# Patient Record
Sex: Female | Born: 1935 | Race: White | Hispanic: No | Marital: Married | Smoking: Never smoker
Health system: Southern US, Community
[De-identification: ages and names within clinical notes are randomized; demographics above are authoritative.]

## PROBLEM LIST (undated history)

## (undated) DIAGNOSIS — M76899 Other specified enthesopathies of unspecified lower limb, excluding foot: Secondary | ICD-10-CM

## (undated) DIAGNOSIS — R42 Dizziness and giddiness: Secondary | ICD-10-CM

## (undated) DIAGNOSIS — T7840XA Allergy, unspecified, initial encounter: Secondary | ICD-10-CM

## (undated) DIAGNOSIS — I10 Essential (primary) hypertension: Secondary | ICD-10-CM

## (undated) DIAGNOSIS — Z9289 Personal history of other medical treatment: Secondary | ICD-10-CM

## (undated) DIAGNOSIS — U071 COVID-19: Secondary | ICD-10-CM

## (undated) DIAGNOSIS — C44611 Basal cell carcinoma of skin of unspecified upper limb, including shoulder: Secondary | ICD-10-CM

## (undated) DIAGNOSIS — D509 Iron deficiency anemia, unspecified: Secondary | ICD-10-CM

## (undated) DIAGNOSIS — I7300 Raynaud's syndrome without gangrene: Secondary | ICD-10-CM

## (undated) DIAGNOSIS — M159 Polyosteoarthritis, unspecified: Secondary | ICD-10-CM

## (undated) HISTORY — DX: Essential (primary) hypertension: I10

## (undated) HISTORY — PX: APPENDECTOMY: SHX54

## (undated) HISTORY — DX: Other specified enthesopathies of unspecified lower limb, excluding foot: M76.899

## (undated) HISTORY — PX: TONSILLECTOMY: SHX5217

## (undated) HISTORY — DX: Personal history of other medical treatment: Z92.89

## (undated) HISTORY — DX: Allergy, unspecified, initial encounter: T78.40XA

## (undated) HISTORY — DX: Polyosteoarthritis, unspecified: M15.9

## (undated) HISTORY — DX: Iron deficiency anemia, unspecified: D50.9

## (undated) HISTORY — DX: Raynaud's syndrome without gangrene: I73.00

---

## 1962-09-15 DIAGNOSIS — Z9289 Personal history of other medical treatment: Secondary | ICD-10-CM

## 1962-09-15 HISTORY — DX: Personal history of other medical treatment: Z92.89

## 1998-09-19 ENCOUNTER — Encounter: Payer: Self-pay | Admitting: Obstetrics and Gynecology

## 1998-09-19 ENCOUNTER — Ambulatory Visit (HOSPITAL_COMMUNITY): Admission: RE | Admit: 1998-09-19 | Discharge: 1998-09-19 | Payer: Self-pay | Admitting: Obstetrics and Gynecology

## 1999-11-27 ENCOUNTER — Encounter: Payer: Self-pay | Admitting: Family Medicine

## 1999-11-27 ENCOUNTER — Encounter: Admission: RE | Admit: 1999-11-27 | Discharge: 1999-11-27 | Payer: Self-pay | Admitting: Family Medicine

## 2003-02-02 ENCOUNTER — Encounter: Admission: RE | Admit: 2003-02-02 | Discharge: 2003-02-02 | Payer: Self-pay | Admitting: Family Medicine

## 2003-02-02 ENCOUNTER — Encounter: Payer: Self-pay | Admitting: Family Medicine

## 2006-09-15 LAB — HM MAMMOGRAPHY: HM Mammogram: NORMAL

## 2009-08-02 ENCOUNTER — Ambulatory Visit: Payer: Self-pay | Admitting: Family Medicine

## 2009-08-02 DIAGNOSIS — D509 Iron deficiency anemia, unspecified: Secondary | ICD-10-CM | POA: Insufficient documentation

## 2009-08-02 DIAGNOSIS — M76899 Other specified enthesopathies of unspecified lower limb, excluding foot: Secondary | ICD-10-CM | POA: Insufficient documentation

## 2009-08-02 DIAGNOSIS — I10 Essential (primary) hypertension: Secondary | ICD-10-CM

## 2009-08-02 HISTORY — DX: Other specified enthesopathies of unspecified lower limb, excluding foot: M76.899

## 2009-08-02 HISTORY — DX: Iron deficiency anemia, unspecified: D50.9

## 2009-08-02 HISTORY — DX: Essential (primary) hypertension: I10

## 2010-07-29 ENCOUNTER — Telehealth: Payer: Self-pay | Admitting: Family Medicine

## 2010-08-12 ENCOUNTER — Ambulatory Visit: Payer: Self-pay | Admitting: Family Medicine

## 2010-08-12 DIAGNOSIS — M159 Polyosteoarthritis, unspecified: Secondary | ICD-10-CM

## 2010-08-12 DIAGNOSIS — I7300 Raynaud's syndrome without gangrene: Secondary | ICD-10-CM | POA: Insufficient documentation

## 2010-08-12 DIAGNOSIS — R21 Rash and other nonspecific skin eruption: Secondary | ICD-10-CM | POA: Insufficient documentation

## 2010-08-12 HISTORY — DX: Polyosteoarthritis, unspecified: M15.9

## 2010-08-12 HISTORY — DX: Raynaud's syndrome without gangrene: I73.00

## 2010-10-15 NOTE — Progress Notes (Signed)
Summary: Pt sch ov for 08/12/10. Req refill of HCTZ to last until ov  Phone Note Refill Request Message from:  Patient on July 29, 2010 11:40 AM  Refills Requested: Medication #1:  HYDROCHLOROTHIAZIDE 25 MG TABS one half by mouth once daily Pls call in partial refill to [facility] in [place], to last until ov on 08/12/10   Method Requested: Telephone to Pharmacy Next Appointment Scheduled: 08-12-10 Initial call taken by: Lucy Antigua,  July 29, 2010 11:41 AM  Follow-up for Phone Call        I filled the #45 with 3 refills as pharmacy had requested. pt taking 1/2 daily.  Informed pt on VM we will see her  on 11/28 Follow-up by: Sid Falcon LPN,  July 29, 2010 12:36 PM

## 2010-10-15 NOTE — Assessment & Plan Note (Signed)
Summary: med check/refills/cjr   Vital Signs:  Patient profile:   77 year old female Menstrual status:  postmenopausal Weight:      140 pounds Temp:     98.4 degrees F oral BP sitting:   140 / 80  (left arm) Cuff size:   regular  Vitals Entered By: Sid Falcon LPN (August 12, 2010 10:53 AM)  History of Present Illness: Patient here to discuss several items as follows.  Hypertension treated with 3 drug regimen. Compliant with all medications. No side effects.  Osteoarthritis mostly involving hips and knees. Has used low-dose tramadol in past from orthopedist which seems to work. Mostly has night pain. Requesting refill of tramadol. Only takes about one at night. Pain is moderate severity.  Still able to walk for exercise.  New issue of some recent whitish discoloring of left hand which is usually very transient. Promptly returns in color with some tingling sensation with warmth. No prior history of Raynaud's. No significant associated pain.  Triggered by cold.    Bilateral forearm rash. Erythematous and slightly scaly. Has not used any creams. No exacerbating features.  Three-week history of upper respiratory and symptoms of nasal congestion, occasional productive cough and some facial pain. No headaches or fever. Also has frequent matting of both eyes.  Hypertension History:      She denies headache, chest pain, palpitations, dyspnea with exertion, orthopnea, PND, peripheral edema, visual symptoms, neurologic problems, syncope, and side effects from treatment.  She notes no problems with any antihypertensive medication side effects.        Positive major cardiovascular risk factors include female age 53 years old or older and hypertension.  Negative major cardiovascular risk factors include non-tobacco-user status.     Allergies: 1)  Lidocaine-Epinephrine (Lidocaine-Epinephrine) 2)  Penicillin V Potassium (Penicillin V Potassium) 3)  Vioxx  Past History:  Past Medical  History: Last updated: 08/02/2009 Anemia-iron deficiency Hypertension Blood Transfusion 1964 Hay fever/Allergies  Past Surgical History: Last updated: 08/02/2009 Appendectomy  1954 Tonsillectomy  1955 A/P repair  1964  Family History: Last updated: 08/02/2009 Family History of Arthritis Family History Hypertension Family History of Prostate CA  Family history Diabetes  Social History: Last updated: 08/02/2009 Retired  Designer, jewellery Married Never Smoked Alcohol use-no Regular exercise-yes  Risk Factors: Exercise: yes (08/02/2009)  Risk Factors: Smoking Status: never (08/02/2009) PMH-FH-SH reviewed for relevance  Review of Systems  The patient denies anorexia, fever, weight loss, hoarseness, chest pain, syncope, dyspnea on exertion, peripheral edema, prolonged cough, headaches, hemoptysis, abdominal pain, melena, hematochezia, and severe indigestion/heartburn.    Physical Exam  General:  Well-developed,well-nourished,in no acute distress; alert,appropriate and cooperative throughout examination Head:  Normocephalic and atraumatic without obvious abnormalities. No apparent alopecia or balding. Eyes:  patient has mild erythema both conjunctiva. Eye exam was normal Ears:  External ear exam shows no significant lesions or deformities.  Otoscopic examination reveals clear canals, tympanic membranes are intact bilaterally without bulging, retraction, inflammation or discharge. Hearing is grossly normal bilaterally. Mouth:  Oral mucosa and oropharynx without lesions or exudates.  Teeth in good repair. Neck:  No deformities, masses, or tenderness noted. Lungs:  Normal respiratory effort, chest expands symmetrically. Lungs are clear to auscultation, no crackles or wheezes. Heart:  normal rate, regular rhythm, and no gallop.   Extremities:  no edema Both hands warm to touch with good cap refill.  Good distal radial and ulnar pulses bilateral. Neurologic:  alert & oriented X3  and cranial nerves II-XII intact.   Skin:  right and left forearm reveals erythematous macular rash which is slightly scaly nonraised with no nodularity. No pigment change. Cervical Nodes:  No lymphadenopathy noted Psych:  normally interactive, good eye contact, not anxious appearing, and not depressed appearing.     Impression & Recommendations:  Problem # 1:  HYPERTENSION (ICD-401.9) meds refilled. Her updated medication list for this problem includes:    Hydrochlorothiazide 25 Mg Tabs (Hydrochlorothiazide) ..... One half by mouth once daily    Amlodipine Besylate 5 Mg Tabs (Amlodipine besylate) ..... One tab daily    Benazepril Hcl 10 Mg Tabs (Benazepril hcl) ..... One tab daily  Problem # 2:  SINUSITIS, ACUTE (ICD-461.9) Assessment: New  Her updated medication list for this problem includes:    Azithromycin 250 Mg Tabs (Azithromycin) .Marland Kitchen... 2 by mouth today then one by mouth once daily for 4 days  Problem # 3:  OSTEOARTHRITIS, GENERALIZED (ICD-715.00) refill tramadol which she uses sparingly. Her updated medication list for this problem includes:    Aspirin 81 Mg Tabs (Aspirin) ..... Once daily    Tramadol Hcl 50 Mg Tabs (Tramadol hcl) ..... One to two tabs as needed  at bedtime    Meloxicam 7.5 Mg Tabs (Meloxicam) ..... One by mouth once daily  Problem # 4:  SKIN RASH (ICD-782.1) ?eczematous.  Trial of steroid cream rec and bx vs derm referral if no better in few weeks.  Problem # 5:  RAYNAUD'S SYNDROME (ICD-443.0) Assessment: New Transient Raynaud's symptoms without other worrisome symptoms.  Reassurance for now.  Complete Medication List: 1)  Vitamin D (ergocalciferol) 50000 Unit Caps (Ergocalciferol) .... Once daily 2)  Caltrate 600 1500 Mg Tabs (Calcium carbonate) .... Once daily 3)  Aspirin 81 Mg Tabs (Aspirin) .... Once daily 4)  Tramadol Hcl 50 Mg Tabs (Tramadol hcl) .... One to two tabs as needed  at bedtime 5)  Hydrochlorothiazide 25 Mg Tabs (Hydrochlorothiazide)  .... One half by mouth once daily 6)  Meloxicam 7.5 Mg Tabs (Meloxicam) .... One by mouth once daily 7)  Amlodipine Besylate 5 Mg Tabs (Amlodipine besylate) .... One tab daily 8)  Benazepril Hcl 10 Mg Tabs (Benazepril hcl) .... One tab daily 9)  Azithromycin 250 Mg Tabs (Azithromycin) .... 2 by mouth today then one by mouth once daily for 4 days  Hypertension Assessment/Plan:      The patient's hypertensive risk group is category B: At least one risk factor (excluding diabetes) with no target organ damage.  Today's blood pressure is 140/80.    Patient Instructions: 1)  Schedule BMP  401.9 2)  Please schedule a follow-up appointment as needed .  3)  It is important that you exercise reguarly at least 20 minutes 5 times a week. If you develop chest pain, have severe difficulty breathing, or feel very tired, stop exercising immediately and seek medical attention.  Prescriptions: BENAZEPRIL HCL 10 MG TABS (BENAZEPRIL HCL) one tab daily  #90 x 3   Entered and Authorized by:   Evelena Peat MD   Signed by:   Evelena Peat MD on 08/12/2010   Method used:   Electronically to        [facility] [phone]* (retail)       [address] [place], [place]  [address]       Ph: [phone] or [phone]       Fax: [phone]   RxID:   [id number] AMLODIPINE BESYLATE 5  MG TABS (AMLODIPINE BESYLATE) one tab daily  #90 x 3   Entered and Authorized by:   Evelena Peat MD   Signed by:   Evelena Peat MD on 08/12/2010   Method used:   Electronically to        [facility] [phone]* (retail)       [address] [place], [place]  [address]       Ph: [phone] or [phone]       Fax: [phone]   RxID:   [id number] HYDROCHLOROTHIAZIDE 25 MG TABS (HYDROCHLOROTHIAZIDE) one half by mouth once daily  #45 Not Speci x 3   Entered and Authorized by:   Evelena Peat MD   Signed by:   Evelena Peat MD on 08/12/2010    Method used:   Electronically to        [facility] [phone]* (retail)       [address] [place], [place]  [address]       Ph: [phone] or [phone]       Fax: [phone]   RxID:   [id number] TRAMADOL HCL 50 MG TABS (TRAMADOL HCL) one tab at bedtime  #60 x 11   Entered and Authorized by:   Evelena Peat MD   Signed by:   Evelena Peat MD on 08/12/2010   Method used:   Electronically to        [facility] [phone]* (retail)       [address] [place], [place]  [address]       Ph: [phone] or [phone]       Fax: [phone]   RxID:   [id number] AZITHROMYCIN 250 MG TABS (AZITHROMYCIN) 2 by mouth today then one by mouth once daily for 4 days  #6 x 0   Entered and Authorized by:   Evelena Peat MD   Signed by:   Evelena Peat MD on 08/12/2010   Method used:   Electronically to        [facility] [phone]* (retail)       [address] [place], [place]  [address]       Ph: [phone] or [phone]       Fax: [phone]   RxID:   [id number]    Orders Added: 1)  Est. Patient Level IV [998[address]]

## 2011-01-01 ENCOUNTER — Encounter: Payer: Self-pay | Admitting: Family Medicine

## 2011-01-02 ENCOUNTER — Encounter: Payer: Self-pay | Admitting: Family Medicine

## 2011-01-02 ENCOUNTER — Ambulatory Visit (INDEPENDENT_AMBULATORY_CARE_PROVIDER_SITE_OTHER): Payer: Medicare PPO | Admitting: Family Medicine

## 2011-01-02 VITALS — BP 150/90 | Temp 98.3°F | Ht 63.25 in | Wt 139.0 lb

## 2011-01-02 DIAGNOSIS — L989 Disorder of the skin and subcutaneous tissue, unspecified: Secondary | ICD-10-CM

## 2011-01-02 NOTE — Patient Instructions (Signed)
Keep wound dry for the first 24 hours then clean daily with soap and water for one week. Apply topical antibiotic daily for 3-4 days. Keep covered with clean dressing for 4-5 days. Follow up promptly for any signs of infection such as redness, warmth, pain, or drainage.  

## 2011-01-02 NOTE — Progress Notes (Addendum)
  Subjective:    Patient ID: ZILDA NO, female    DOB: [birth year], 77 y.o.   MRN: [id number]  HPI Skin lesion right arm. Present for one possibly 2 years. Occasionally itches. Mostly macular erythematous. We noticed recently on visit. She does not have any personal history of skin cancer but does have high risk with skin freckling and fair skin.   Review of Systems     Objective:   Physical Exam  Constitutional: She appears well-developed and well-nourished. No distress.  Cardiovascular: Normal rate and regular rhythm.   Pulmonary/Chest: Breath sounds normal. No respiratory distress. She has no wheezes. She has no rales.  Skin:       Patient has macular erythematous rash which is nonblanching and slightly scaly right forearm dorsally. This is approximately 1 x 1 cm in dimension. No nodular changes. No pustules. No ulceration.          Assessment & Plan:  Skin lesion right forearm. Rule out Bowen's disease versus other. Recommended punch biopsy and patient consented after hearing risks and benefits. Anesthesia 1% Xylocaine with epinephrine. Skin prepped with Betadine. Punch biopsy with 4 mm punch. Minimal bleeding. Closed with one suture of 5 Ethilon. Antibiotic and dressing applied  Path superficial basal cell cancer.  Pt has been notified and will set up to see derm.

## 2011-01-06 ENCOUNTER — Telehealth: Payer: Self-pay | Admitting: *Deleted

## 2011-01-06 NOTE — Progress Notes (Signed)
Addended by: Trinna Post on: 01/06/2011 07:54 AM   Modules accepted: Orders

## 2011-01-06 NOTE — Telephone Encounter (Signed)
Yes, add back.

## 2011-01-06 NOTE — Telephone Encounter (Signed)
VM from pt to report last at last week OV she told us she was not taking Norvasc/Amlodipine 5 mg.  Wanted Dr Caryl Never to be made aware she IS taking it. This was removed from her med list. OK to add again?

## 2011-01-07 NOTE — Telephone Encounter (Signed)
Med added to list . 

## 2011-04-04 ENCOUNTER — Encounter: Payer: Self-pay | Admitting: Family Medicine

## 2011-07-08 ENCOUNTER — Encounter: Payer: Self-pay | Admitting: Family Medicine

## 2011-08-18 ENCOUNTER — Encounter: Payer: Self-pay | Admitting: Family Medicine

## 2011-08-18 ENCOUNTER — Ambulatory Visit (INDEPENDENT_AMBULATORY_CARE_PROVIDER_SITE_OTHER): Payer: Medicare PPO | Admitting: Family Medicine

## 2011-08-18 VITALS — BP 132/72 | Temp 98.6°F | Wt 136.0 lb

## 2011-08-18 DIAGNOSIS — Z23 Encounter for immunization: Secondary | ICD-10-CM

## 2011-08-18 DIAGNOSIS — I10 Essential (primary) hypertension: Secondary | ICD-10-CM

## 2011-08-18 DIAGNOSIS — M159 Polyosteoarthritis, unspecified: Secondary | ICD-10-CM

## 2011-08-18 LAB — BASIC METABOLIC PANEL
Calcium: 9.4 mg/dL (ref 8.4–10.5)
Chloride: 102 mEq/L (ref 96–112)
Creatinine, Ser: 0.7 mg/dL (ref 0.4–1.2)

## 2011-08-18 MED ORDER — BENAZEPRIL HCL 10 MG PO TABS: 10.0000 mg | ORAL_TABLET | Freq: Every day | ORAL | Status: DC

## 2011-08-18 MED ORDER — AMLODIPINE BESYLATE 5 MG PO TABS: 5.0000 mg | ORAL_TABLET | Freq: Every day | ORAL | Status: DC

## 2011-08-18 MED ORDER — TRAMADOL HCL 50 MG PO TABS: 50.0000 mg | ORAL_TABLET | Freq: Four times a day (QID) | ORAL | Status: DC | PRN

## 2011-08-18 MED ORDER — HYDROCHLOROTHIAZIDE 25 MG PO TABS: 12.5000 mg | ORAL_TABLET | Freq: Every day | ORAL | Status: DC

## 2011-08-18 NOTE — Progress Notes (Signed)
  Subjective:    Patient ID: Molly Patterson, female    DOB: 01/31/1932, 77 y.o.   MRN: [id number]  HPI  Medical followup. Hypertension treated with amlodipine, benazepril, and hydrochlorothiazide. Compliant with treatment. Blood pressure stable by home readings. No recent orthostasis. Patient denies any chest pains or dizziness.  History of osteoarthritis which is generalized. Uses low-dose tramadol mostly at night which is working very well for her. No progressive symptoms. No recent falls. Needs refills of all medications.  PMH,SH, FH reviewed:  Past Medical History  Diagnosis Date  . ANEMIA-IRON DEFICIENCY 08/02/2009  . BURSITIS, HIP 08/02/2009  . HYPERTENSION 08/02/2009  . OSTEOARTHRITIS, GENERALIZED 08/12/2010  . Raynaud's syndrome 08/12/2010  . Allergy     hayfever  . Transfusion history 1964   Past Surgical History  Procedure Date  . Appendectomy   . Tonsillectomy     reports that she has never smoked. She does not have any smokeless tobacco history on file. She reports that she does not drink alcohol. Her drug history not on file. family history is negative for Arthritis, and Hypertension, and Diabetes, and Prostate cancer, . Allergies  Allergen Reactions  . Lidocaine-Epinephrine     REACTION: pulse rapic  . Penicillins     REACTION: hives  . Rofecoxib     Vioxx, itiching      Review of Systems  Constitutional: Negative for fever, activity change, appetite change, fatigue and unexpected weight change.  HENT: Negative for hearing loss, ear pain, sore throat and trouble swallowing.   Eyes: Negative for visual disturbance.  Respiratory: Negative for cough and shortness of breath.   Cardiovascular: Negative for chest pain and palpitations.  Gastrointestinal: Negative for abdominal pain, diarrhea, constipation and blood in stool.  Genitourinary: Negative for dysuria and hematuria.  Musculoskeletal: Negative for myalgias, back pain and arthralgias.  Skin:  Negative for rash.  Neurological: Negative for dizziness, syncope and headaches.  Hematological: Negative for adenopathy.  Psychiatric/Behavioral: Negative for confusion and dysphoric mood.       Objective:   Physical Exam  Constitutional: She is oriented to person, place, and time. She appears well-developed and well-nourished.  Neck: Neck supple. No thyromegaly present.  Cardiovascular: Normal rate, regular rhythm and normal heart sounds.   Pulmonary/Chest: Effort normal and breath sounds normal. No respiratory distress. She has no wheezes. She has no rales.  Musculoskeletal: She exhibits no edema.  Lymphadenopathy:    She has no cervical adenopathy.  Neurological: She is alert and oriented to person, place, and time.          Assessment & Plan:  #1 hypertension stable.  refill all medications for one year. Check basic metabolic panel #2 osteoarthritis, generalized. Stable. Refill tramadol for one year

## 2011-08-19 NOTE — Progress Notes (Signed)
Quick Note:  Pt informed ______ 

## 2011-12-22 ENCOUNTER — Encounter: Payer: Self-pay | Admitting: Family Medicine

## 2011-12-22 ENCOUNTER — Ambulatory Visit (INDEPENDENT_AMBULATORY_CARE_PROVIDER_SITE_OTHER): Payer: Medicare Other | Admitting: Family Medicine

## 2011-12-22 VITALS — BP 120/60 | Temp 98.2°F | Wt 138.0 lb

## 2011-12-22 DIAGNOSIS — N6489 Other specified disorders of breast: Secondary | ICD-10-CM

## 2011-12-22 NOTE — Progress Notes (Signed)
  Subjective:    Patient ID: Molly Patterson, female    DOB: [birth year], 78 y.o.   MRN: [id number]  HPI  Patient seen for recent right chest wall injury. She was going down some steps and came out of her shoe and fell against the rail banister. She had extensive bruising of the right breast and now has very small less than 1 cm tender nodule right breast upper medial aspect around the 2:00 position. This is actually decreasing in size compared to couple weeks ago. She's had no nipple discharge. No adenopathy.   Review of Systems  Constitutional: Negative for appetite change and unexpected weight change.  Respiratory: Negative for shortness of breath.   Cardiovascular: Negative for chest pain.       Objective:   Physical Exam  Constitutional: She appears well-developed and well-nourished.  Cardiovascular: Normal rate and regular rhythm.   Pulmonary/Chest: Effort normal and breath sounds normal. No respiratory distress. She has no wheezes. She has no rales.       Breasts are symmetric. No visible ecchymosis. She has small less than 1 cm movable and tender nodule right upper medial breast around 2:00 position.          Assessment & Plan:  Probable small hematoma right breast given her recent history (fall with bruising) and the fact this is slightly tender. Diagnostic mammogram and ultrasound if indicated will be ordered. Suspect this will resolve over time

## 2012-01-23 ENCOUNTER — Encounter: Payer: Self-pay | Admitting: Family Medicine

## 2012-04-30 ENCOUNTER — Ambulatory Visit (INDEPENDENT_AMBULATORY_CARE_PROVIDER_SITE_OTHER): Payer: Medicare Other | Admitting: Family Medicine

## 2012-04-30 ENCOUNTER — Encounter: Payer: Self-pay | Admitting: Family Medicine

## 2012-04-30 VITALS — BP 120/80 | Temp 98.1°F | Wt 135.0 lb

## 2012-04-30 DIAGNOSIS — M76899 Other specified enthesopathies of unspecified lower limb, excluding foot: Secondary | ICD-10-CM

## 2012-04-30 DIAGNOSIS — M7062 Trochanteric bursitis, left hip: Secondary | ICD-10-CM

## 2012-04-30 MED ORDER — METHYLPREDNISOLONE ACETATE 40 MG/ML IJ SUSP
40.0000 mg | Freq: Once | INTRAMUSCULAR | Status: AC
  Administered 2012-04-30: 40 mg via INTRAMUSCULAR

## 2012-04-30 NOTE — Progress Notes (Signed)
  Subjective:    Patient ID: Molly Patterson, female    DOB: 07-Nov-1931, 78 y.o.   MRN: [id number]  HPI  Bilateral hip pain. First noticed some right lateral hip pain and right lateral thigh pain several weeks ago with flexing at the lumbar spine. This pain was a sharp shooting pain which was very transient and relieved with some extension stretches and very little problem at this time. Greater pain is left hip laterally over bursa. This is relatively continuous. Tramadol with minimal relief. No recent injury. Went to chiropractor and x-rays lumbar spine were reportedly showed degenerative changes. She denies any lower any numbness or weakness. Pain is moderate. No rashes. No urine incontinence.   Review of Systems  Constitutional: Negative for fever, chills, appetite change and unexpected weight change.  Genitourinary: Negative for dysuria.  Skin: Negative for rash.       Objective:   Physical Exam  Constitutional: She appears well-developed and well-nourished.  Cardiovascular: Normal rate and regular rhythm.   Pulmonary/Chest: Effort normal and breath sounds normal. No respiratory distress. She has no wheezes. She has no rales.  Musculoskeletal:       Patient has excellent range of motion both hips. Left hip reveals some tenderness laterally over her greater trochanteric bursa. Straight leg raises are negative bilaterally. No edema  Neurological:       Full-strength lower extremities. 2+ reflexes knee and ankle bilaterally. Normal sensory function.          Assessment & Plan:  Greater trochanter bursitis left hip. Discussed options. Continue icing. Discussed risk and benefits of corticosteroid injection and patient consented. Left hip prepped with Betadine. 40 mg Depo-Medrol and 2 cc plain Xylocaine injected without difficulty.

## 2012-04-30 NOTE — Patient Instructions (Addendum)
Hip Bursitis Bursitis is a swelling and soreness (inflammation) of a fluid-filled sac (bursa). This sac overlies and protects the joints.  CAUSES   Injury.   Overuse of the muscles surrounding the joint.   Arthritis.   Gout.   Infection.   Cold weather.   Inadequate warm-up and conditioning prior to activities.  The cause may not be known.  SYMPTOMS   Mild to severe irritation.   Tenderness and swelling over the outside of the hip.   Pain with motion of the hip.   If the bursa becomes infected, a fever may be present. Redness, tenderness, and warmth will develop over the hip.  Symptoms usually lessen in 3 to 4 weeks with treatment, but can come back. TREATMENT If conservative treatment does not work, your caregiver may advise draining the bursa and injecting cortisone into the area. This may speed up the healing process. This may also be used as an initial treatment of choice. HOME CARE INSTRUCTIONS   Apply ice to the affected area for 15 to 20 minutes every 3 to 4 hours while awake for the first 2 days. Put the ice in a plastic bag and place a towel between the bag of ice and your skin.   Rest the painful joint as much as possible, but continue to put the joint through a normal range of motion at least 4 times per day. When the pain lessens, begin normal, slow movements and usual activities to help prevent stiffness of the hip.   Only take over-the-counter or prescription medicines for pain, discomfort, or fever as directed by your caregiver.   Use crutches to limit weight bearing on the hip joint, if advised.   Elevate your painful hip to reduce swelling. Use pillows for propping and cushioning your legs and hips.   Gentle massage may provide comfort and decrease swelling.  SEEK IMMEDIATE MEDICAL CARE IF:   Your pain increases even during treatment, or you are not improving.   You have a fever.   You have heat and inflammation over the involved bursa.   You have  any other questions or concerns.  MAKE SURE YOU:   Understand these instructions.   Will watch your condition.   Will get help right away if you are not doing well or get worse.  Document Released: 02/21/2002 Document Revised: 08/21/2011 Document Reviewed: 09/20/2008 [employer] Patient Information 2012 [employer]. 

## 2012-07-19 ENCOUNTER — Ambulatory Visit (INDEPENDENT_AMBULATORY_CARE_PROVIDER_SITE_OTHER): Payer: Medicare Other | Admitting: Family Medicine

## 2012-07-19 ENCOUNTER — Telehealth: Payer: Self-pay | Admitting: *Deleted

## 2012-07-19 ENCOUNTER — Encounter: Payer: Self-pay | Admitting: Family Medicine

## 2012-07-19 VITALS — BP 140/72 | Temp 98.2°F | Wt 137.0 lb

## 2012-07-19 DIAGNOSIS — I10 Essential (primary) hypertension: Secondary | ICD-10-CM

## 2012-07-19 DIAGNOSIS — M159 Polyosteoarthritis, unspecified: Secondary | ICD-10-CM

## 2012-07-19 DIAGNOSIS — Z23 Encounter for immunization: Secondary | ICD-10-CM

## 2012-07-19 DIAGNOSIS — M76899 Other specified enthesopathies of unspecified lower limb, excluding foot: Secondary | ICD-10-CM

## 2012-07-19 MED ORDER — TRAMADOL HCL 50 MG PO TABS: ORAL_TABLET | ORAL | Status: DC

## 2012-07-19 NOTE — Telephone Encounter (Signed)
[facility] requesting clarification on Ultram, 1 tab "how often", missing on Rx.  Called pharmacist back, one to two tabs every 6 hours prn pain

## 2012-07-19 NOTE — Progress Notes (Signed)
  Subjective:    Patient ID: Molly Patterson, female    DOB: [birth year], 78 y.o.   MRN: [id number]  HPI  Patient here for medical followup. She has hypertension treated with amlodipine, Lotensin, and HCTZ. Blood pressure well controlled. No dizziness. No orthostasis. No recent chest pains.  Ongoing bilateral hip pains. Location is lateral. Tends to be better with movement and worse end of day after rest. No medial hip pain. Recent steroid injection which helped only briefly for about one week. She's been icing some and using Voltaren gel with mild relief.  Uses tramadol which is providing good relief and uses mostly at night. Requesting refills.  Past Medical History  Diagnosis Date  . ANEMIA-IRON DEFICIENCY 08/02/2009  . BURSITIS, HIP 08/02/2009  . HYPERTENSION 08/02/2009  . OSTEOARTHRITIS, GENERALIZED 08/12/2010  . Raynaud's syndrome 08/12/2010  . Allergy     hayfever  . Transfusion history 1964   Past Surgical History  Procedure Date  . Appendectomy   . Tonsillectomy     reports that she has never smoked. She does not have any smokeless tobacco history on file. She reports that she does not drink alcohol. Her drug history not on file. family history is negative for Arthritis, and Hypertension, and Diabetes, and Prostate cancer, . Allergies  Allergen Reactions  . Lidocaine-Epinephrine     REACTION: pulse rapic  . Penicillins     REACTION: hives  . Rofecoxib     Vioxx, itiching      Review of Systems  Constitutional: Negative for fatigue and unexpected weight change.  Eyes: Negative for visual disturbance.  Respiratory: Negative for cough, chest tightness, shortness of breath and wheezing.   Cardiovascular: Negative for chest pain, palpitations and leg swelling.  Neurological: Negative for dizziness, seizures, syncope, weakness, light-headedness and headaches.       Objective:   Physical Exam  Constitutional: She appears well-developed and well-nourished.    Cardiovascular: Normal rate and regular rhythm.   Pulmonary/Chest: Effort normal and breath sounds normal. No respiratory distress. She has no wheezes. She has no rales.  Musculoskeletal: She exhibits no edema.       Excellent range of motion both hips. She has some continued mild tenderness left greater trochanteric region.          Assessment & Plan:  #1 hypertension. Stable. Continue current medications #2 osteoarthritis involving multiple joints. Refill tramadol for as needed use  #3 bursitis hips. We've elected not to do any further injections. Continue topical nonsteroidal and icing. This is not limiting her activities in any significant way #4 health maintenance. Flu vaccine given

## 2012-09-14 ENCOUNTER — Other Ambulatory Visit: Payer: Self-pay

## 2012-09-14 MED ORDER — AMLODIPINE BESYLATE 5 MG PO TABS: 5.0000 mg | ORAL_TABLET | Freq: Every day | ORAL | Status: DC

## 2012-09-14 MED ORDER — BENAZEPRIL HCL 10 MG PO TABS: 10.0000 mg | ORAL_TABLET | Freq: Every day | ORAL | Status: DC

## 2012-09-27 ENCOUNTER — Ambulatory Visit (INDEPENDENT_AMBULATORY_CARE_PROVIDER_SITE_OTHER): Payer: Medicare Other | Admitting: Family Medicine

## 2012-09-27 ENCOUNTER — Encounter: Payer: Self-pay | Admitting: Family Medicine

## 2012-09-27 VITALS — BP 150/90 | Temp 98.7°F | Wt 139.0 lb

## 2012-09-27 DIAGNOSIS — M533 Sacrococcygeal disorders, not elsewhere classified: Secondary | ICD-10-CM

## 2012-09-27 MED ORDER — TRAMADOL HCL 50 MG PO TABS: ORAL_TABLET | ORAL | Status: DC

## 2012-09-27 MED ORDER — HYDROCODONE-ACETAMINOPHEN 5-325 MG PO TABS: ORAL_TABLET | ORAL | Status: DC

## 2012-09-27 MED ORDER — HYDROCHLOROTHIAZIDE 25 MG PO TABS: 12.5000 mg | ORAL_TABLET | Freq: Every day | ORAL | Status: DC

## 2012-09-27 NOTE — Progress Notes (Signed)
  Subjective:    Patient ID: HODA HON, female    DOB: 10/10/1931, 79 y.o.   MRN: [id number]  HPI Acute visit. 2 weeks ago fell down stairs and landed on her coccyx region. She was wearing slick socks and slipped. She's had pain in coccyx region since then mostly with changing positions. She denies any bruising. Pain is moderate to severe at times. Pain exacerbated by rolling over in bed. She's tried Tylenol, Aleve, and tramadol without much relief. No other injuries reported.   Review of Systems  Musculoskeletal: Negative for gait problem.  Neurological: Negative for dizziness and syncope.       Objective:   Physical Exam  Constitutional: She appears well-developed and well-nourished.  Cardiovascular: Normal rate and regular rhythm.   Pulmonary/Chest: Effort normal and breath sounds normal. No respiratory distress. She has no wheezes. She has no rales.  Musculoskeletal: She exhibits no edema.       Tender coccyx region. No lumbar tenderness. Straight leg raises are negative. Full ROM both hips without difficulty.          Assessment & Plan:  Coccyx pain following fall. We discussed this could be fractured but would not change therapy. No x-rays obtained. Limited hydrocodone 5/325 mg one to 2 every 6 hours as needed for pain.

## 2012-12-09 ENCOUNTER — Other Ambulatory Visit: Payer: Self-pay | Admitting: Family Medicine

## 2013-07-04 ENCOUNTER — Ambulatory Visit (INDEPENDENT_AMBULATORY_CARE_PROVIDER_SITE_OTHER): Payer: Medicare Other | Admitting: Family Medicine

## 2013-07-04 ENCOUNTER — Encounter: Payer: Self-pay | Admitting: Family Medicine

## 2013-07-04 VITALS — BP 146/80 | HR 84 | Temp 98.0°F | Wt 136.0 lb

## 2013-07-04 DIAGNOSIS — Z23 Encounter for immunization: Secondary | ICD-10-CM

## 2013-07-04 DIAGNOSIS — I10 Essential (primary) hypertension: Secondary | ICD-10-CM

## 2013-07-04 LAB — BASIC METABOLIC PANEL
Calcium: 10.2 mg/dL (ref 8.4–10.5)
Creatinine, Ser: 0.7 mg/dL (ref 0.4–1.2)

## 2013-07-04 NOTE — Addendum Note (Signed)
Addended by: Thomasena Edis on: 07/04/2013 04:36 PM   Modules accepted: Orders

## 2013-07-04 NOTE — Progress Notes (Signed)
  Subjective:    Patient ID: Molly Patterson, female    DOB: [birth year], 79 y.o.   MRN: [id number]  HPI Patient seen for the following She has multiple seborrheic keratoses. She has seen dermatologist as recently this past summer and had basal cell carcinoma removed from her nose. She has one large scaly lesion on her forehead that she wishes to have evaluated. No recent changes. This has been seen by dermatologist previously and she was given reassurance was a keratosis.  Hypertension treated with benazepril, amlodipine, and HCTZ. Compliant with medications. Blood pressure stable. No dizziness. No chest pains. No recent falls.  Past Medical History  Diagnosis Date  . ANEMIA-IRON DEFICIENCY 08/02/2009  . BURSITIS, HIP 08/02/2009  . HYPERTENSION 08/02/2009  . OSTEOARTHRITIS, GENERALIZED 08/12/2010  . Raynaud's syndrome 08/12/2010  . Allergy     hayfever  . Transfusion history 1964   Past Surgical History  Procedure Laterality Date  . Appendectomy    . Tonsillectomy      reports that she has never smoked. She does not have any smokeless tobacco history on file. She reports that she does not drink alcohol. Her drug history is not on file. family history is negative for Arthritis, Hypertension, Diabetes, and Prostate cancer. Allergies  Allergen Reactions  . Lidocaine-Epinephrine     REACTION: pulse rapic  . Penicillins     REACTION: hives  . Rofecoxib     Vioxx, itiching      Review of Systems  Constitutional: Negative for fatigue and unexpected weight change.  Eyes: Negative for visual disturbance.  Respiratory: Negative for cough, chest tightness, shortness of breath and wheezing.   Cardiovascular: Negative for chest pain, palpitations and leg swelling.  Neurological: Negative for dizziness, seizures, syncope, weakness, light-headedness and headaches.       Objective:   Physical Exam  Constitutional: She appears well-developed and well-nourished.  Neck: Neck supple.  No thyromegaly present.  Cardiovascular: Normal rate.  Exam reveals no gallop.   Pulmonary/Chest: Effort normal and breath sounds normal. No respiratory distress. She has no wheezes. She has no rales.  Musculoskeletal: She exhibits no edema.  Skin:  Has a well-demarcated brown scaly lesion right forehead consistent with seborrheic keratosis-approximately 1 cm diameter          Assessment & Plan:  #1 hypertension. Adequately control. Check basic metabolic panel  #2 benign seborrheic keratosis involving face. Reassurance #3 health maintenance. Flu vaccine given

## 2013-07-15 ENCOUNTER — Telehealth: Payer: Self-pay

## 2013-07-15 NOTE — Telephone Encounter (Signed)
Tramadol 50mg   Last refill #180 3 refill Last visit 07/04/13  [facility]  [address]

## 2013-07-17 NOTE — Telephone Encounter (Signed)
Refill OK times 3.

## 2013-07-18 MED ORDER — TRAMADOL HCL 50 MG PO TABS: ORAL_TABLET | ORAL | Status: DC

## 2013-07-18 NOTE — Telephone Encounter (Signed)
RX called in to the pharmacy.  

## 2013-09-06 ENCOUNTER — Other Ambulatory Visit: Payer: Self-pay | Admitting: Family Medicine

## 2013-10-19 ENCOUNTER — Encounter: Payer: Self-pay | Admitting: Family Medicine

## 2013-10-19 ENCOUNTER — Ambulatory Visit (INDEPENDENT_AMBULATORY_CARE_PROVIDER_SITE_OTHER): Payer: Medicare Other | Admitting: Family Medicine

## 2013-10-19 VITALS — BP 144/80 | HR 65 | Temp 98.3°F | Wt 140.0 lb

## 2013-10-19 DIAGNOSIS — J209 Acute bronchitis, unspecified: Secondary | ICD-10-CM

## 2013-10-19 MED ORDER — PREDNISONE 10 MG PO TABS: ORAL_TABLET | ORAL | Status: DC

## 2013-10-19 NOTE — Progress Notes (Signed)
   Subjective:    Patient ID: Molly Patterson, female    DOB: Jul 25, 1932, 80 y.o.   MRN: [id number]  Cough Pertinent negatives include no chest pain, chills, fever or shortness of breath.   Patient is seen for a 4 week history of cough Nonsmoker. She generally feels well. Does not feel ill. She's had some postnasal drip. No fever. No hemoptysis. No dyspnea. Possibly some mild wheezing off and on. No GERD symptoms. She does take ACE inhibitor but has been on this for many years with no difficulties.]  Past Medical History  Diagnosis Date  . ANEMIA-IRON DEFICIENCY 08/02/2009  . BURSITIS, HIP 08/02/2009  . HYPERTENSION 08/02/2009  . OSTEOARTHRITIS, GENERALIZED 08/12/2010  . Raynaud's syndrome 08/12/2010  . Allergy     hayfever  . Transfusion history 1964   Past Surgical History  Procedure Laterality Date  . Appendectomy    . Tonsillectomy      reports that she has never smoked. She does not have any smokeless tobacco history on file. She reports that she does not drink alcohol. Her drug history is not on file. family history is negative for Arthritis, Hypertension, Diabetes, and Prostate cancer. Allergies  Allergen Reactions  . Lidocaine-Epinephrine     REACTION: pulse rapic  . Penicillins     REACTION: hives  . Rofecoxib     Vioxx, itiching       Review of Systems  Constitutional: Negative for fever and chills.  HENT: Positive for congestion. Negative for sinus pressure.   Respiratory: Positive for cough. Negative for shortness of breath.   Cardiovascular: Negative for chest pain.       Objective:   Physical Exam  Constitutional: She appears well-developed and well-nourished.  HENT:  Right Ear: External ear normal.  Left Ear: External ear normal.  Mouth/Throat: Oropharynx is clear and moist.  Neck: Neck supple.  Cardiovascular: Normal rate.   Pulmonary/Chest: Effort normal and breath sounds normal. No respiratory distress. She has no rales.  She has just  a few very faint wheezes. No retractions. No rales.  Musculoskeletal: She exhibits no edema.  Lymphadenopathy:    She has no cervical adenopathy.          Assessment & Plan:  Cough. Suspect acute viral bronchitis cough. She's had some very mild wheezes.  No indications for active infection. Brief prednisone taper and reviewed possible side effects. Followup promptly for fever or worsening symptoms

## 2013-10-19 NOTE — Patient Instructions (Signed)
Acute Bronchitis Bronchitis is inflammation of the airways that extend from the windpipe into the lungs (bronchi). The inflammation often causes mucus to develop. This leads to a cough, which is the most common symptom of bronchitis.  In acute bronchitis, the condition usually develops suddenly and goes away over time, usually in a couple weeks. Smoking, allergies, and asthma can make bronchitis worse. Repeated episodes of bronchitis may cause further lung problems.  CAUSES Acute bronchitis is most often caused by the same virus that causes a cold. The virus can spread from person to person (contagious).  SIGNS AND SYMPTOMS   Cough.   Fever.   Coughing up mucus.   Body aches.   Chest congestion.   Chills.   Shortness of breath.   Sore throat.  DIAGNOSIS  Acute bronchitis is usually diagnosed through a physical exam. Tests, such as chest X-rays, are sometimes done to rule out other conditions.  TREATMENT  Acute bronchitis usually goes away in a couple weeks. Often times, no medical treatment is necessary. Medicines are sometimes given for relief of fever or cough. Antibiotics are usually not needed but may be prescribed in certain situations. In some cases, an inhaler may be recommended to help reduce shortness of breath and control the cough. A cool mist vaporizer may also be used to help thin bronchial secretions and make it easier to clear the chest.  HOME CARE INSTRUCTIONS  Get plenty of rest.   Drink enough fluids to keep your urine clear or pale yellow (unless you have a medical condition that requires fluid restriction). Increasing fluids may help thin your secretions and will prevent dehydration.   Only take over-the-counter or prescription medicines as directed by your health care provider.   Avoid smoking and secondhand smoke. Exposure to cigarette smoke or irritating chemicals will make bronchitis worse. If you are a smoker, consider using nicotine gum or skin  patches to help control withdrawal symptoms. Quitting smoking will help your lungs heal faster.   Reduce the chances of another bout of acute bronchitis by washing your hands frequently, avoiding people with cold symptoms, and trying not to touch your hands to your mouth, nose, or eyes.   Follow up with your health care provider as directed.  SEEK MEDICAL CARE IF: Your symptoms do not improve after 1 week of treatment.  SEEK IMMEDIATE MEDICAL CARE IF:  You develop an increased fever or chills.   You have chest pain.   You have severe shortness of breath.  You have bloody sputum.   You develop dehydration.  You develop fainting.  You develop repeated vomiting.  You develop a severe headache. MAKE SURE YOU:   Understand these instructions.  Will watch your condition.  Will get help right away if you are not doing well or get worse. Document Released: 10/09/2004 Document Revised: 05/04/2013 Document Reviewed: 02/22/2013 Exit[employer] Patient Information 2014 [employer].  

## 2013-10-19 NOTE — Progress Notes (Signed)
Pre visit review using our clinic review tool, if applicable. No additional management support is needed unless otherwise documented below in the visit note. 

## 2014-03-04 ENCOUNTER — Other Ambulatory Visit: Payer: Self-pay | Admitting: Family Medicine

## 2014-03-06 NOTE — Telephone Encounter (Signed)
Last visit 10/19/13 Last refill 07/18/13 #180 3 refill

## 2014-03-06 NOTE — Telephone Encounter (Signed)
Refills OK for 6 months. 

## 2014-05-29 ENCOUNTER — Other Ambulatory Visit: Payer: Self-pay | Admitting: Family Medicine

## 2014-07-21 ENCOUNTER — Encounter: Payer: Self-pay | Admitting: Family Medicine

## 2014-07-21 ENCOUNTER — Ambulatory Visit (INDEPENDENT_AMBULATORY_CARE_PROVIDER_SITE_OTHER): Payer: Medicare Other | Admitting: Family Medicine

## 2014-07-21 ENCOUNTER — Ambulatory Visit (INDEPENDENT_AMBULATORY_CARE_PROVIDER_SITE_OTHER): Payer: Medicare Other

## 2014-07-21 VITALS — BP 140/80 | HR 94 | Temp 98.3°F | Wt 137.0 lb

## 2014-07-21 DIAGNOSIS — Z23 Encounter for immunization: Secondary | ICD-10-CM

## 2014-07-21 DIAGNOSIS — I10 Essential (primary) hypertension: Secondary | ICD-10-CM

## 2014-07-21 LAB — BASIC METABOLIC PANEL
BUN: 12 mg/dL (ref 6–23)
CALCIUM: 9.3 mg/dL (ref 8.4–10.5)
CO2: 30 mEq/L (ref 19–32)
Chloride: 102 mEq/L (ref 96–112)
Creatinine, Ser: 0.6 mg/dL (ref 0.4–1.2)
GFR: 100.13 mL/min (ref >60.00)
Glucose, Bld: 92 mg/dL (ref 70–99)
Potassium: 3.6 mEq/L (ref 3.5–5.1)
SODIUM: 140 meq/L (ref 135–145)

## 2014-07-21 MED ORDER — TRAMADOL HCL 50 MG PO TABS: 50.0000 mg | ORAL_TABLET | Freq: Two times a day (BID) | ORAL | Status: DC | PRN

## 2014-07-21 MED ORDER — AMLODIPINE BESYLATE 5 MG PO TABS: 5.0000 mg | ORAL_TABLET | Freq: Every day | ORAL | Status: DC

## 2014-07-21 MED ORDER — BENAZEPRIL HCL 10 MG PO TABS: 10.0000 mg | ORAL_TABLET | Freq: Every day | ORAL | Status: DC

## 2014-07-21 MED ORDER — HYDROCHLOROTHIAZIDE 25 MG PO TABS: ORAL_TABLET | ORAL | Status: DC

## 2014-07-21 NOTE — Progress Notes (Signed)
Pre visit review using our clinic review tool, if applicable. No additional management support is needed unless otherwise documented below in the visit note. 

## 2014-07-21 NOTE — Progress Notes (Signed)
   Subjective:    Patient ID: Molly Patterson, female    DOB: [birth year], 80 y.o.   MRN: [id number]  HPI   Routine medical follow-up. Patient has hypertension treated with benazepril, amlodipine, and HCTZ. Her blood pressure is stable by home readings. No recent dizziness. Compliant with medication. She had minimally low potassium of 3.4 year ago and she also has osteoarthritis takes tramadol total about 3 tablets daily and this is controlling her symptoms fairly well. Denies any recent falls. No depression issues. She declines pneumonia vaccine.  Past Medical History  Diagnosis Date  . ANEMIA-IRON DEFICIENCY 08/02/2009  . BURSITIS, HIP 08/02/2009  . HYPERTENSION 08/02/2009  . OSTEOARTHRITIS, GENERALIZED 08/12/2010  . Raynaud's syndrome 08/12/2010  . Allergy     hayfever  . Transfusion history 1964   Past Surgical History  Procedure Laterality Date  . Appendectomy    . Tonsillectomy      reports that she has never smoked. She does not have any smokeless tobacco history on file. She reports that she does not drink alcohol. Her drug history is not on file. family history is negative for Arthritis, Hypertension, Diabetes, and Prostate cancer. Allergies  Allergen Reactions  . Lidocaine-Epinephrine     REACTION: pulse rapic  . Penicillins     REACTION: hives  . Rofecoxib     Vioxx, itiching      Review of Systems  Constitutional: Negative for fatigue.  Eyes: Negative for visual disturbance.  Respiratory: Negative for cough, chest tightness, shortness of breath and wheezing.   Cardiovascular: Negative for chest pain, palpitations and leg swelling.  Neurological: Negative for dizziness, seizures, syncope, weakness, light-headedness and headaches.       Objective:   Physical Exam  Constitutional: She appears well-developed and well-nourished.  Neck: Neck supple. No thyromegaly present.  Cardiovascular: Normal rate and regular rhythm.   Pulmonary/Chest: Effort normal and  breath sounds normal. No respiratory distress. She has no wheezes. She has no rales.  Musculoskeletal: She exhibits no edema.          Assessment & Plan:  #1 hypertension. Stable. Refill all medications for one year. Check basic metabolic panel. Continue home monitoring #2 osteoarthritis. Stable on low-dose tramadol. She knows to avoid regular nonsteroidals #3 health maintenance. Flu vaccine given. Patient declines pneumonia vaccine.

## 2014-07-24 ENCOUNTER — Telehealth: Payer: Self-pay | Admitting: Family Medicine

## 2014-07-24 NOTE — Telephone Encounter (Signed)
emmi mailed  °

## 2014-10-27 ENCOUNTER — Other Ambulatory Visit: Payer: Self-pay | Admitting: Family Medicine

## 2014-10-27 NOTE — Telephone Encounter (Signed)
Last visit 07/21/14 Last refill 07/21/14 #180 3 refill

## 2014-10-28 NOTE — Telephone Encounter (Signed)
Refill for 3 months. 

## 2014-11-07 ENCOUNTER — Ambulatory Visit (INDEPENDENT_AMBULATORY_CARE_PROVIDER_SITE_OTHER): Payer: Medicare Other | Admitting: Family Medicine

## 2014-11-07 ENCOUNTER — Encounter: Payer: Self-pay | Admitting: Family Medicine

## 2014-11-07 VITALS — BP 130/80 | HR 75 | Temp 98.1°F | Wt 140.0 lb

## 2014-11-07 DIAGNOSIS — R059 Cough, unspecified: Secondary | ICD-10-CM

## 2014-11-07 DIAGNOSIS — R05 Cough: Secondary | ICD-10-CM

## 2014-11-07 NOTE — Patient Instructions (Signed)
Consider either chlorpheniramine or brompheniramine at night for any postnasal drip symptoms. These could be sedating.

## 2014-11-07 NOTE — Progress Notes (Signed)
   Subjective:    Patient ID: Molly Patterson, female    DOB: [birth year], 81 y.o.   MRN: [id number]  HPI Acute visit. 81 year old nonsmoker seen with one month history of cough. Mostly nonproductive. She's had some postnasal drip symptoms. Nonsmoker. She has taken Mucinex. Denies any facial pain. No headaches. No purulent secretions. No history of fevers or chills. She does not feel particularly sick. No dyspnea. No obvious wheezing. She does take ACE inhibitor but his never had associated cough.  Past Medical History  Diagnosis Date  . ANEMIA-IRON DEFICIENCY 08/02/2009  . BURSITIS, HIP 08/02/2009  . HYPERTENSION 08/02/2009  . OSTEOARTHRITIS, GENERALIZED 08/12/2010  . Raynaud's syndrome 08/12/2010  . Allergy     hayfever  . Transfusion history 1964   Past Surgical History  Procedure Laterality Date  . Appendectomy    . Tonsillectomy      reports that she has never smoked. She does not have any smokeless tobacco history on file. She reports that she does not drink alcohol. Her drug history is not on file. family history is negative for Arthritis, Hypertension, Diabetes, and Prostate cancer. Allergies  Allergen Reactions  . Lidocaine-Epinephrine     REACTION: pulse rapic  . Penicillins     REACTION: hives  . Rofecoxib     Vioxx, itiching      Review of Systems  Constitutional: Negative for fever and chills.  HENT: Positive for congestion and postnasal drip.   Respiratory: Positive for cough. Negative for shortness of breath and wheezing.   Cardiovascular: Negative for chest pain.  Neurological: Negative for dizziness.       Objective:   Physical Exam  Constitutional: She appears well-developed and well-nourished.  HENT:  Right Ear: External ear normal.  Left Ear: External ear normal.  Mouth/Throat: Oropharynx is clear and moist.  Neck: Neck supple.  Cardiovascular: Normal rate and regular rhythm.   Pulmonary/Chest: Breath sounds normal. No respiratory distress.  She has no wheezes. She has no rales.  Lymphadenopathy:    She has no cervical adenopathy.          Assessment & Plan:  Cough. Suspect resolving viral bronchitis. Nonfocal exam. She does not have any red flags such as appetite or weight changes or dyspnea. Try over-the-counter chlorpheniramine at night for postnasal drip symptoms. Touch base if cough not resolving over the next 2 weeks

## 2014-11-07 NOTE — Progress Notes (Signed)
Pre visit review using our clinic review tool, if applicable. No additional management support is needed unless otherwise documented below in the visit note. 

## 2014-11-22 ENCOUNTER — Telehealth: Payer: Self-pay | Admitting: Family Medicine

## 2014-11-22 MED ORDER — BENZONATATE 200 MG PO CAPS: 200.0000 mg | ORAL_CAPSULE | Freq: Three times a day (TID) | ORAL | Status: DC | PRN

## 2014-11-22 NOTE — Telephone Encounter (Signed)
Pt informed. Pt does not want to change medication. Pt will try the tessalon perles. Refused CXR this time. Rx sent to pharmacy. Pt aware.

## 2014-11-22 NOTE — Telephone Encounter (Signed)
Pt states she was to let the dr know if she does not get better. Pt still has the cough. Pt is not better. States it sounds like a bronchial cough,. No fever Non productive. Was not given any med 2 weeks ago, would like rx call rx into [facility]

## 2014-11-22 NOTE — Telephone Encounter (Signed)
Recommend: STOP Lotensin Start Losartan 50 mg once daily Get CXR Tessalon perles 200 mg every 8 hours prn cough #30

## 2014-11-24 ENCOUNTER — Other Ambulatory Visit: Payer: Self-pay | Admitting: Family Medicine

## 2014-11-24 NOTE — Telephone Encounter (Signed)
Called to see if the medication is helping patient.

## 2015-01-22 ENCOUNTER — Other Ambulatory Visit: Payer: Self-pay | Admitting: Family Medicine

## 2015-01-22 NOTE — Telephone Encounter (Signed)
Refill OK

## 2015-01-22 NOTE — Telephone Encounter (Signed)
Last visit 11/07/14 Last refill 10/30/14 #180 2 refill

## 2015-07-25 ENCOUNTER — Encounter: Payer: Self-pay | Admitting: Family Medicine

## 2015-07-25 ENCOUNTER — Ambulatory Visit (INDEPENDENT_AMBULATORY_CARE_PROVIDER_SITE_OTHER): Payer: Medicare Other | Admitting: Family Medicine

## 2015-07-25 VITALS — BP 150/90 | HR 90 | Temp 98.2°F | Resp 16 | Ht 63.25 in | Wt 138.9 lb

## 2015-07-25 DIAGNOSIS — I10 Essential (primary) hypertension: Secondary | ICD-10-CM | POA: Diagnosis not present

## 2015-07-25 DIAGNOSIS — Z23 Encounter for immunization: Secondary | ICD-10-CM

## 2015-07-25 MED ORDER — BENAZEPRIL HCL 10 MG PO TABS: 10.0000 mg | ORAL_TABLET | Freq: Every day | ORAL | Status: DC

## 2015-07-25 MED ORDER — AMLODIPINE BESYLATE 5 MG PO TABS: 5.0000 mg | ORAL_TABLET | Freq: Every day | ORAL | Status: DC

## 2015-07-25 MED ORDER — HYDROCHLOROTHIAZIDE 25 MG PO TABS: ORAL_TABLET | ORAL | Status: DC

## 2015-07-25 NOTE — Progress Notes (Signed)
   Subjective:    Patient ID: Molly Patterson, female    DOB: 30-Sep-1931, 81 y.o.   MRN: [id number]  HPI Patient seen for follow-up hypertension. She is treated with benazepril, amlodipine, and low-dose HCTZ. Compliant with medications. She has long history of probable "whitecoat syndrome ". Blood pressure stable by home readings. No headaches. No dizziness. No chest pains. She needs flu vaccine and also no history of documented Prevnar 13. No contraindications. She has some osteoarthritis and takes low-dose tramadol which helps  Past Medical History  Diagnosis Date  . ANEMIA-IRON DEFICIENCY 08/02/2009  . BURSITIS, HIP 08/02/2009  . HYPERTENSION 08/02/2009  . OSTEOARTHRITIS, GENERALIZED 08/12/2010  . Raynaud's syndrome 08/12/2010  . Allergy     hayfever  . Transfusion history 1964   Past Surgical History  Procedure Laterality Date  . Appendectomy    . Tonsillectomy      reports that she has never smoked. She does not have any smokeless tobacco history on file. She reports that she does not drink alcohol. Her drug history is not on file. family history is negative for Arthritis, Hypertension, Diabetes, and Prostate cancer. Allergies  Allergen Reactions  . Lidocaine-Epinephrine     REACTION: pulse rapic  . Penicillins     REACTION: hives  . Rofecoxib     Vioxx, itiching      Review of Systems  Constitutional: Negative for fatigue.  Eyes: Negative for visual disturbance.  Respiratory: Negative for cough, chest tightness, shortness of breath and wheezing.   Cardiovascular: Negative for chest pain, palpitations and leg swelling.  Neurological: Negative for dizziness, seizures, syncope, weakness, light-headedness and headaches.       Objective:   Physical Exam  Constitutional: She appears well-developed and well-nourished.  Eyes: Pupils are equal, round, and reactive to light.  Neck: Neck supple. No JVD present. No thyromegaly present.  Cardiovascular: Normal rate and  regular rhythm.  Exam reveals no gallop.   Pulmonary/Chest: Effort normal and breath sounds normal. No respiratory distress. She has no wheezes. She has no rales.  Musculoskeletal: She exhibits no edema.  Neurological: She is alert.          Assessment & Plan:  Hypertension. Probable white coat syndrome. Stable by home readings. Refill medication for one year.  Health maintenance. Flu vaccine and Prevnar 13 given. Routine follow-up one year and sooner as needed

## 2015-07-25 NOTE — Progress Notes (Signed)
Pre visit review using our clinic review tool, if applicable. No additional management support is needed unless otherwise documented below in the visit note. 

## 2015-09-20 ENCOUNTER — Other Ambulatory Visit: Payer: Self-pay | Admitting: Family Medicine

## 2015-09-21 ENCOUNTER — Other Ambulatory Visit: Payer: Self-pay | Admitting: Family Medicine

## 2015-09-24 ENCOUNTER — Telehealth: Payer: Self-pay | Admitting: Family Medicine

## 2015-09-24 NOTE — Telephone Encounter (Signed)
See medication refill.

## 2015-09-24 NOTE — Telephone Encounter (Signed)
Pt needs a refill on tramadol send to [facility] in [place]

## 2015-09-25 NOTE — Telephone Encounter (Signed)
Refill OK

## 2015-12-10 ENCOUNTER — Other Ambulatory Visit: Payer: Self-pay | Admitting: Family Medicine

## 2015-12-11 NOTE — Telephone Encounter (Signed)
Refill with one additional refill. 

## 2015-12-11 NOTE — Telephone Encounter (Signed)
Last refill 09-24-2015 #180, 1rf Last appt 07-25-2015  No pending appt

## 2016-05-12 ENCOUNTER — Other Ambulatory Visit: Payer: Self-pay | Admitting: Family Medicine

## 2016-05-12 NOTE — Telephone Encounter (Signed)
Rx refill sent to pharmacy. 

## 2016-05-13 ENCOUNTER — Other Ambulatory Visit: Payer: Self-pay | Admitting: Family Medicine

## 2016-05-13 NOTE — Telephone Encounter (Signed)
Last OV 07-25-2015 Last refill 12/12/2015 #180, 1rf Please advise

## 2016-05-13 NOTE — Telephone Encounter (Signed)
Refill once and needs to set up office follow up in next month.

## 2016-05-14 MED ORDER — TRAMADOL HCL 50 MG PO TABS: 50.0000 mg | ORAL_TABLET | Freq: Four times a day (QID) | ORAL | 0 refills | Status: DC | PRN

## 2016-07-22 ENCOUNTER — Ambulatory Visit (INDEPENDENT_AMBULATORY_CARE_PROVIDER_SITE_OTHER): Payer: Medicare Other | Admitting: Family Medicine

## 2016-07-22 VITALS — BP 160/80 | HR 103 | Temp 98.0°F | Ht 63.25 in | Wt 137.5 lb

## 2016-07-22 DIAGNOSIS — Z23 Encounter for immunization: Secondary | ICD-10-CM | POA: Diagnosis not present

## 2016-07-22 DIAGNOSIS — R05 Cough: Secondary | ICD-10-CM

## 2016-07-22 DIAGNOSIS — M150 Primary generalized (osteo)arthritis: Secondary | ICD-10-CM | POA: Diagnosis not present

## 2016-07-22 DIAGNOSIS — R059 Cough, unspecified: Secondary | ICD-10-CM

## 2016-07-22 DIAGNOSIS — M8949 Other hypertrophic osteoarthropathy, multiple sites: Secondary | ICD-10-CM

## 2016-07-22 DIAGNOSIS — I10 Essential (primary) hypertension: Secondary | ICD-10-CM

## 2016-07-22 DIAGNOSIS — M159 Polyosteoarthritis, unspecified: Secondary | ICD-10-CM

## 2016-07-22 NOTE — Progress Notes (Signed)
Pre visit review using our clinic review tool, if applicable. No additional management support is needed unless otherwise documented below in the visit note. 

## 2016-07-22 NOTE — Progress Notes (Signed)
Subjective:     Patient ID: Molly Patterson, female   DOB: 08-02-1932, 82 y.o.   MRN: [id number]  HPI Patient seen for medical follow-up and acute issue of three-week history of cough. She is nonsmoker and has never smoked. She thinks this is maybe allergic postnasal drip related. She was helping her daughter move some things out of an old house. She has taken over-the-counter Xyzal with some relief. No wheezing. No dyspnea. Denies any fevers or chills. No hemoptysis. No recent appetite or weight changes. Cough is mostly during the day and minimal night.  Hypertension treated with combination therapy of amlodipine, benazepril, and HCTZ. Home blood pressure very stable. No dizziness. Systolic readings ranging from 120-140. Diastolics around 80. No chest pains. Compliant with therapy.  Osteoarthritis involving multiple joints. She takes tramadol usually 2 per day total and it seems to be controlling her symptoms fairly well. She has involvement of multiple joints including hands, knees, ankles.  Past Medical History:  Diagnosis Date  . Allergy    hayfever  . ANEMIA-IRON DEFICIENCY 08/02/2009  . BURSITIS, HIP 08/02/2009  . HYPERTENSION 08/02/2009  . OSTEOARTHRITIS, GENERALIZED 08/12/2010  . Raynaud's syndrome 08/12/2010  . Transfusion history 1964   Past Surgical History:  Procedure Laterality Date  . APPENDECTOMY    . TONSILLECTOMY      reports that she has never smoked. She does not have any smokeless tobacco history on file. She reports that she does not drink alcohol. Her drug history is not on file. family history is not on file. Allergies  Allergen Reactions  . Lidocaine-Epinephrine     REACTION: pulse rapic  . Penicillins     REACTION: hives  . Rofecoxib     Vioxx, itiching     Review of Systems  Constitutional: Negative for fatigue.  HENT: Positive for postnasal drip.   Eyes: Negative for visual disturbance.  Respiratory: Positive for cough. Negative for chest  tightness, shortness of breath and wheezing.   Cardiovascular: Negative for chest pain, palpitations and leg swelling.  Musculoskeletal: Positive for arthralgias.  Neurological: Negative for dizziness, seizures, syncope, weakness, light-headedness and headaches.       Objective:   Physical Exam  Constitutional: She appears well-developed and well-nourished.  HENT:  Mouth/Throat: Oropharynx is clear and moist.  Neck: Neck supple. No thyromegaly present.  Cardiovascular: Normal rate and regular rhythm.   Pulmonary/Chest: Effort normal and breath sounds normal. No respiratory distress. She has no wheezes. She has no rales.  Musculoskeletal: She exhibits no edema.  Lymphadenopathy:    She has no cervical adenopathy.       Assessment:     #1 cough. Suspect postnasal drip related. Nonfocal exam  #2 hypertension stable and at goal  #3 osteoarthritis involving multiple joints    Plan:     -Continue current medications -Flu vaccine given -Continue over-the-counter antihistamine as needed for postnasal drip symptoms -follow up for any fever, dyspnea, or if cough not better in 2-3 weeks  Eulas Post MD [facility]

## 2016-07-22 NOTE — Patient Instructions (Signed)
Continue with Xyzal for postnasal drip symptoms.   Let me know if cough not resolved in the next 2-3 weeks.

## 2016-07-30 ENCOUNTER — Telehealth: Payer: Self-pay | Admitting: Family Medicine

## 2016-07-30 DIAGNOSIS — R05 Cough: Secondary | ICD-10-CM | POA: Diagnosis not present

## 2016-07-30 DIAGNOSIS — J40 Bronchitis, not specified as acute or chronic: Secondary | ICD-10-CM | POA: Diagnosis not present

## 2016-07-30 NOTE — Telephone Encounter (Signed)
[facility]  Patient Name: Molly Patterson  DOB: Jan 11, 1932    Initial Comment Caller had a cough, got a chest x-ray at urgent care and was given antibiotics, wants to know if she should take the medicine or not.    Nurse Assessment  Nurse: Harlow Mares, RN, Suanne Marker Date/Time Eilene Ghazi Time): 07/30/2016 4:51:00 PM  Confirm and document reason for call. If symptomatic, describe symptoms. You must click the next button to save text entered. ---Caller had a cough, got a chest x-ray at urgent care and was given antibiotics, wants to know if she should take the medicine or not. Reports prednisone and doxycycline was also ordered. Reports that she hasn't had a fever. She has a productive cough.  Has the patient traveled out of the country within the last 30 days? ---Not Applicable  Does the patient have any new or worsening symptoms? ---No  Please document clinical information provided and list any resource used. ---Caller also reports that she saw Dr. Elease Hashimoto a few weeks ago and was told that if the cough lingered he would obtain an xray. She reports that she didn't want to drive to the office so she just went to the local [facility] to have this done. She wanted another nurses advice on whether to take the abx since she doesn't have a fever. Caller reports that she has had the cough for 3-4 weeks now. Advised caller that since she has had a lingering cough and at the recommendation of the MD at the [facility] (based on clinical findings including a Chest Xray), nurse would advise that she take the abx as prescribed and if symptoms continue without improvement or worsen, to call back for further instructions. Caller voiced understanding.     Guidelines    Guideline Title Affirmed Question Affirmed Notes       Final Disposition User   Clinical Call Harlow Mares, RN, Suanne Marker

## 2016-07-31 NOTE — Telephone Encounter (Signed)
Spoke with pt and she has started abx as directed by UC. She is mailing cxr report to our office for her records. Advised pt that if she does not improve after abx to contact office for appt. She voiced understanding. Nothing further needed at this time.

## 2016-08-12 ENCOUNTER — Telehealth: Payer: Self-pay | Admitting: Family Medicine

## 2016-08-12 MED ORDER — AMLODIPINE BESYLATE 5 MG PO TABS: ORAL_TABLET | ORAL | 3 refills | Status: DC

## 2016-08-12 MED ORDER — BENAZEPRIL HCL 10 MG PO TABS: ORAL_TABLET | ORAL | 3 refills | Status: DC

## 2016-08-12 MED ORDER — HYDROCHLOROTHIAZIDE 25 MG PO TABS: 12.5000 mg | ORAL_TABLET | Freq: Every day | ORAL | 3 refills | Status: DC

## 2016-08-12 NOTE — Telephone Encounter (Signed)
Pt request refill   amLODipine (NORVASC) 5 MG tablet benazepril (LOTENSIN) 10 MG tablet hydrochlorothiazide (HYDRODIURIL) 25 MG tablet  Pt has [facility]  Fax  [phone]  Pt saw Dr 11/07 and refills should have been sent, but pharmacy has not received. Can you resend? Pt going out of town tomorrow.

## 2016-08-12 NOTE — Telephone Encounter (Signed)
Medications sent in for patient. 

## 2016-08-13 ENCOUNTER — Telehealth: Payer: Self-pay | Admitting: Family Medicine

## 2016-08-13 NOTE — Telephone Encounter (Signed)
Last refill 05-14-2016 #180 Last OV 07-22-2016 Please advise

## 2016-08-13 NOTE — Telephone Encounter (Signed)
Pharmacy called for pt to request a refill of Pt states she was to have her traMADol (ULTRAM) 50 MG tablet Refilled as well.  Can you send in for pt?  [place]

## 2016-08-15 ENCOUNTER — Other Ambulatory Visit: Payer: Self-pay

## 2016-08-15 MED ORDER — AMLODIPINE BESYLATE 5 MG PO TABS: ORAL_TABLET | ORAL | 3 refills | Status: DC

## 2016-08-15 MED ORDER — BENAZEPRIL HCL 10 MG PO TABS: ORAL_TABLET | ORAL | 3 refills | Status: DC

## 2016-08-15 MED ORDER — HYDROCHLOROTHIAZIDE 25 MG PO TABS: 12.5000 mg | ORAL_TABLET | Freq: Every day | ORAL | 3 refills | Status: DC

## 2016-08-15 MED ORDER — TRAMADOL HCL 50 MG PO TABS: 50.0000 mg | ORAL_TABLET | Freq: Four times a day (QID) | ORAL | 0 refills | Status: DC | PRN

## 2016-08-15 NOTE — Telephone Encounter (Signed)
Medication verbally called in for patient. 

## 2016-08-15 NOTE — Telephone Encounter (Signed)
Refill OK

## 2016-09-02 DIAGNOSIS — J018 Other acute sinusitis: Secondary | ICD-10-CM | POA: Diagnosis not present

## 2016-09-30 ENCOUNTER — Encounter: Payer: Self-pay | Admitting: Family Medicine

## 2016-09-30 ENCOUNTER — Ambulatory Visit (INDEPENDENT_AMBULATORY_CARE_PROVIDER_SITE_OTHER): Payer: Medicare Other | Admitting: Family Medicine

## 2016-09-30 VITALS — BP 126/74 | HR 83 | Temp 98.1°F | Ht 63.25 in | Wt 138.6 lb

## 2016-09-30 DIAGNOSIS — R05 Cough: Secondary | ICD-10-CM

## 2016-09-30 DIAGNOSIS — I10 Essential (primary) hypertension: Secondary | ICD-10-CM | POA: Diagnosis not present

## 2016-09-30 DIAGNOSIS — R059 Cough, unspecified: Secondary | ICD-10-CM

## 2016-09-30 DIAGNOSIS — R0982 Postnasal drip: Secondary | ICD-10-CM | POA: Diagnosis not present

## 2016-09-30 MED ORDER — BENZONATATE 100 MG PO CAPS: 100.0000 mg | ORAL_CAPSULE | Freq: Three times a day (TID) | ORAL | 0 refills | Status: DC | PRN

## 2016-09-30 MED ORDER — FLUTICASONE PROPIONATE 50 MCG/ACT NA SUSP: 2.0000 | Freq: Every day | NASAL | 1 refills | Status: DC

## 2016-09-30 NOTE — Progress Notes (Signed)
HPI:  PND/Cough: -reports intermittent for several months but feels never completely resolved -reports treated in [place] despite no PNA on CXR with doxy and septra on different occassions and short course of low dose steroid -steroid seemed to help -septra did not seem to help -feels is a drainage issue -reports hx allergies and ? Mild asthma though does not take anything for this -denies any reflux or heartburn and seems not happy that I mentioned silent reflux when discussing various causes of chronic PND and cough as she does not feel she has this -takes acei - she also does not think this is causing her cough -no fevers, malaise, wheezing, SOB, hemoptysis, discolored phlegm, CP, sinus pain or discolored sinus congestion -non smoker and no hx smoking per nursing notes  ROS: See pertinent positives and negatives per HPI.  Past Medical History:  Diagnosis Date  . Allergy    hayfever  . ANEMIA-IRON DEFICIENCY 08/02/2009  . BURSITIS, HIP 08/02/2009  . HYPERTENSION 08/02/2009  . OSTEOARTHRITIS, GENERALIZED 08/12/2010  . Raynaud's syndrome 08/12/2010  . Transfusion history 1964    Past Surgical History:  Procedure Laterality Date  . APPENDECTOMY    . TONSILLECTOMY      Family History  Problem Relation Age of Onset  . Arthritis Neg Hx     family  . Hypertension Neg Hx     family  . Diabetes Neg Hx     family  . Prostate cancer Neg Hx     family    Social History   Social History  . Marital status: Married    Spouse name: N/A  . Number of children: N/A  . Years of education: N/A   Social History Main Topics  . Smoking status: Never Smoker  . Smokeless tobacco: None  . Alcohol use No  . Drug use: Unknown  . Sexual activity: Not Asked   Other Topics Concern  . None   Social History Narrative  . None     Current Outpatient Prescriptions:  .  amLODipine (NORVASC) 5 MG tablet, TAKE ONE (1) TABLET EACH DAY, Disp: 90 tablet, Rfl: 3 .  aspirin 81 MG tablet,  Take 81 mg by mouth daily. Takes 3x per week., Disp: , Rfl:  .  benazepril (LOTENSIN) 10 MG tablet, TAKE ONE (1) TABLET EACH DAY, Disp: 90 tablet, Rfl: 3 .  Calcium Carb-Cholecalciferol (CALCIUM PLUS VITAMIN D3) 600-500 MG-UNIT CAPS, Take by mouth daily. Takes 4x per week., Disp: , Rfl:  .  hydrochlorothiazide (HYDRODIURIL) 25 MG tablet, Take 0.5 tablets (12.5 mg total) by mouth daily., Disp: 45 tablet, Rfl: 3 .  traMADol (ULTRAM) 50 MG tablet, Take 1-2 tablets (50-100 mg total) by mouth every 6 (six) hours as needed. for pain., Disp: 180 tablet, Rfl: 0 .  benzonatate (TESSALON PERLES) 100 MG capsule, Take 1 capsule (100 mg total) by mouth 3 (three) times daily as needed for cough., Disp: 20 capsule, Rfl: 0 .  fluticasone (FLONASE) 50 MCG/ACT nasal spray, Place 2 sprays into both nostrils daily., Disp: 16 g, Rfl: 1  EXAM:  Vitals:   09/30/16 1305  BP: 126/74  Pulse: 83  Temp: 98.1 F (36.7 C)    Body mass index is 24.36 kg/m.  GENERAL: vitals reviewed and listed above, alert, oriented, appears well hydrated and in no acute distress  HEENT: atraumatic, conjunttiva clear, no obvious abnormalities on inspection of external nose and ears, normal appearance of ear canals and TMs, clear nasal congestion with boggy turbinates, mild  post oropharyngeal erythema with PND, no tonsillar edema or exudate, no sinus TTP  NECK: no obvious masses on inspection  LUNGS: clear to auscultation bilaterally, no wheezes, rales or rhonchi, good air movement  CV: HRRR, no peripheral edema  MS: moves all extremities without noticeable abnormality  PSYCH: pleasant and cooperative, no obvious depression or anxiety  ASSESSMENT AND PLAN:  Discussed the following assessment and plan:  Cough  -we discussed possible serious and likely etiologies, workup and treatment, treatment risks and return precautions; discussed various causes of chronic cough and suspect several viral infections in a row, allergies,  mild underlying undiagnosed asthma or silent reflux most likely. Acei may be contributing. -after this discussion, Alysha opted for trying treatment for allergies/PND as she feels this is the cause. She seemed irritated at the suggestion of GERD or acei as potential causes when we discussed various causes. -will try flonase, tessalon acutely and explained flonase can take about 1 week to kick in -of note, BP was elevated a little on arrival, but improve don recheck - plan to monitor on follow up -follow up advised 2-4 weeks and may need further eval if persists -of course, we advised Offie  to return or notify a doctor immediately if symptoms worsen or persist or new concerns arise.   Patient Instructions  BEFORE YOU LEAVE: -follow up: 2- 4 weeks  Start the flonase and use 2 sprays each nostril daily for 1 month.  Use the tessalon for cough if needed.  Seek care sooner if worsening or other concerns.    Colin Benton R., DO

## 2016-09-30 NOTE — Progress Notes (Signed)
Pre visit review using our clinic review tool, if applicable. No additional management support is needed unless otherwise documented below in the visit note. 

## 2016-09-30 NOTE — Patient Instructions (Signed)
BEFORE YOU LEAVE: -follow up: 2- 4 weeks  Start the flonase and use 2 sprays each nostril daily for 1 month.  Use the tessalon for cough if needed.  Seek care sooner if worsening or other concerns.

## 2016-10-28 ENCOUNTER — Telehealth: Payer: Self-pay

## 2016-10-28 MED ORDER — TRAMADOL HCL 50 MG PO TABS: 50.0000 mg | ORAL_TABLET | Freq: Four times a day (QID) | ORAL | 0 refills | Status: DC | PRN

## 2016-10-28 NOTE — Telephone Encounter (Signed)
Medication called in for patient.  

## 2016-10-28 NOTE — Telephone Encounter (Signed)
[facility] requesting Tramadol 50mg  tablet Last refill 08-15-2016 #180, 0rf Last OV 07-22-2016  Please advise on refill

## 2016-10-28 NOTE — Telephone Encounter (Signed)
Refill OK

## 2016-10-28 NOTE — Addendum Note (Signed)
Addended by: Elio Forget on: 10/28/2016 10:16 AM   Modules accepted: Orders

## 2017-01-29 ENCOUNTER — Telehealth: Payer: Self-pay

## 2017-01-29 ENCOUNTER — Other Ambulatory Visit: Payer: Self-pay

## 2017-01-29 MED ORDER — TRAMADOL HCL 50 MG PO TABS: 50.0000 mg | ORAL_TABLET | Freq: Four times a day (QID) | ORAL | 0 refills | Status: DC | PRN

## 2017-01-29 NOTE — Telephone Encounter (Signed)
Rx has been called in as directed. Thanks! 

## 2017-01-29 NOTE — Telephone Encounter (Signed)
Refill OK

## 2017-01-29 NOTE — Telephone Encounter (Signed)
[facility] requesting Tramadol HCL 50mg  tablets. Last office visit was 09/30/16 with Dr. Maudie Mercury for acute issue.  Last refill was 10/28/16 for #180 with 0 refills.  Please advise on refill.

## 2017-05-11 ENCOUNTER — Telehealth: Payer: Self-pay | Admitting: Family Medicine

## 2017-05-11 NOTE — Telephone Encounter (Signed)
Pt needs refill on tramadol

## 2017-05-11 NOTE — Telephone Encounter (Signed)
Last refill 01/29/17 and last office visit with Dr Elease Hashimoto was 07/22/16.  Okay to fill?

## 2017-05-11 NOTE — Telephone Encounter (Signed)
Refill OK.  Needs office follow up within 2 months.

## 2017-05-12 MED ORDER — TRAMADOL HCL 50 MG PO TABS: 50.0000 mg | ORAL_TABLET | Freq: Four times a day (QID) | ORAL | 0 refills | Status: DC | PRN

## 2017-07-27 ENCOUNTER — Ambulatory Visit: Payer: Medicare Other | Admitting: Family Medicine

## 2017-07-27 ENCOUNTER — Encounter: Payer: Self-pay | Admitting: Family Medicine

## 2017-07-27 VITALS — BP 160/80 | HR 71 | Temp 98.3°F | Wt 138.5 lb

## 2017-07-27 DIAGNOSIS — Z23 Encounter for immunization: Secondary | ICD-10-CM

## 2017-07-27 DIAGNOSIS — I10 Essential (primary) hypertension: Secondary | ICD-10-CM | POA: Diagnosis not present

## 2017-07-27 DIAGNOSIS — R05 Cough: Secondary | ICD-10-CM | POA: Diagnosis not present

## 2017-07-27 DIAGNOSIS — R059 Cough, unspecified: Secondary | ICD-10-CM

## 2017-07-27 LAB — BASIC METABOLIC PANEL
BUN: 11 mg/dL (ref 6–23)
CHLORIDE: 99 meq/L (ref 96–112)
CO2: 31 mEq/L (ref 19–32)
CREATININE: 0.61 mg/dL (ref 0.40–1.20)
Calcium: 10.2 mg/dL (ref 8.4–10.5)
GFR: 99.38 mL/min (ref >60.00)
GLUCOSE: 103 mg/dL — AB (ref 70–99)
Potassium: 3.7 mEq/L (ref 3.5–5.1)
Sodium: 140 mEq/L (ref 135–145)

## 2017-07-27 MED ORDER — AMLODIPINE BESYLATE 10 MG PO TABS: 10.0000 mg | ORAL_TABLET | Freq: Every day | ORAL | 11 refills | Status: DC

## 2017-07-27 NOTE — Patient Instructions (Signed)
Go ahead and increase the Amlodipine to 10 mg once daily.

## 2017-07-27 NOTE — Progress Notes (Signed)
Subjective:     Patient ID: Molly Patterson, female   DOB: [birth year], 83 y.o.   MRN: [id number]  HPI Patient seen for medical follow-up. She has history of hypertension and currently treated with amlodipine 5 mg daily and Lotensin 10 mg daily and HCTZ 12.5 mg once daily. Not monitoring blood pressures at home. No headaches or dizziness. No chest pains. No peripheral edema issues. Compliant with therapy and denies any side effects.  Also seen for acute problem as follows. Cough for past couple weeks. Mostly dry. No fever. No dyspnea. No pleuritic pain. Still needs her flu vaccine. Nonsmoker. No history of wheezing.   Past Medical History:  Diagnosis Date  . Allergy    hayfever  . ANEMIA-IRON DEFICIENCY 08/02/2009  . BURSITIS, HIP 08/02/2009  . HYPERTENSION 08/02/2009  . OSTEOARTHRITIS, GENERALIZED 08/12/2010  . Raynaud's syndrome 08/12/2010  . Transfusion history 1964   Past Surgical History:  Procedure Laterality Date  . APPENDECTOMY    . TONSILLECTOMY      reports that  has never smoked. she has never used smokeless tobacco. She reports that she does not drink alcohol. Her drug history is not on file. family history is not on file. Allergies  Allergen Reactions  . Lidocaine-Epinephrine     REACTION: pulse rapic  . Penicillins     REACTION: hives  . Rofecoxib     Vioxx, itiching     Review of Systems  Constitutional: Negative for fatigue.  Eyes: Negative for visual disturbance.  Respiratory: Negative for cough, chest tightness, shortness of breath and wheezing.   Cardiovascular: Negative for chest pain, palpitations and leg swelling.  Neurological: Negative for dizziness, seizures, syncope, weakness, light-headedness and headaches.       Objective:   Physical Exam  Constitutional: She appears well-developed and well-nourished.  HENT:  Right Ear: External ear normal.  Left Ear: External ear normal.  Mouth/Throat: Oropharynx is clear and moist.  Cardiovascular:  Normal rate and regular rhythm.  Pulmonary/Chest: Effort normal and breath sounds normal. No respiratory distress. She has no wheezes. She has no rales.  Musculoskeletal: She exhibits no edema.       Assessment:     #1 hypertension suboptimally controlled with isolated systolic hypertension. Repeat blood pressure after rest left arm seated 158/70  #2 dry cough. Nonfocal exam. Suspect viral    Plan:     -Go ahead with flu vaccine -Increase amlodipine to 10 mg once daily -Continue Lotensin 10 mg daily and HCTZ 12.5 mg daily -Check basic metabolic panel -Follow-up in one month reassess blood pressure  Eulas Post MD [facility] at [facility]

## 2017-08-17 ENCOUNTER — Other Ambulatory Visit: Payer: Self-pay | Admitting: Family Medicine

## 2017-08-17 NOTE — Telephone Encounter (Signed)
Refill OK

## 2017-08-17 NOTE — Telephone Encounter (Signed)
Last refill 05/12/17  And last office visit 07/27/17.  Okay to fill?

## 2017-08-25 ENCOUNTER — Ambulatory Visit: Payer: Medicare Other | Admitting: Family Medicine

## 2017-09-11 ENCOUNTER — Encounter: Payer: Self-pay | Admitting: Family Medicine

## 2017-09-11 ENCOUNTER — Ambulatory Visit: Payer: Medicare Other | Admitting: Family Medicine

## 2017-09-11 VITALS — BP 138/80 | HR 105 | Temp 98.4°F | Ht 63.25 in | Wt 140.7 lb

## 2017-09-11 DIAGNOSIS — R059 Cough, unspecified: Secondary | ICD-10-CM

## 2017-09-11 DIAGNOSIS — I10 Essential (primary) hypertension: Secondary | ICD-10-CM

## 2017-09-11 DIAGNOSIS — R05 Cough: Secondary | ICD-10-CM

## 2017-09-11 MED ORDER — AZITHROMYCIN 250 MG PO TABS: ORAL_TABLET | ORAL | 0 refills | Status: AC

## 2017-09-11 MED ORDER — PREDNISONE 10 MG PO TABS: ORAL_TABLET | ORAL | 0 refills | Status: DC

## 2017-09-11 NOTE — Progress Notes (Signed)
Subjective:     Patient ID: Molly Patterson, female   DOB: July 26, 1932, 83 y.o.   MRN: [id number]  HPI Patient seen with approximately one-month history of productive cough. She is nonsmoker. She was seen here for uncontrolled hypertension back in November. We increased her amlodipine to 10 mg daily. Blood pressure is improved. No headaches. No dizziness. Consistent home blood pressures running 120/70.  Regarding her cough, this is dry and occasionally productive of yellow sputum for the past several days. She initially had some postnasal drip but not now. Denies any GERD symptoms. No definite wheezing. No hemoptysis. No appetite or weight changes. She does take Lotensin but has been on this drug for many years  Past Medical History:  Diagnosis Date  . Allergy    hayfever  . ANEMIA-IRON DEFICIENCY 08/02/2009  . BURSITIS, HIP 08/02/2009  . HYPERTENSION 08/02/2009  . OSTEOARTHRITIS, GENERALIZED 08/12/2010  . Raynaud's syndrome 08/12/2010  . Transfusion history 1964   Past Surgical History:  Procedure Laterality Date  . APPENDECTOMY    . TONSILLECTOMY      reports that  has never smoked. she has never used smokeless tobacco. She reports that she does not drink alcohol. Her drug history is not on file. family history is not on file. Allergies  Allergen Reactions  . Lidocaine-Epinephrine     REACTION: pulse rapic  . Penicillins     REACTION: hives  . Rofecoxib     Vioxx, itiching     Review of Systems  Constitutional: Negative for appetite change, chills, fatigue, fever and unexpected weight change.  Eyes: Negative for visual disturbance.  Respiratory: Positive for cough. Negative for chest tightness, shortness of breath and wheezing.   Cardiovascular: Negative for chest pain, palpitations and leg swelling.  Neurological: Negative for dizziness, seizures, syncope, weakness, light-headedness and headaches.       Objective:   Physical Exam  Constitutional: She is oriented  to person, place, and time. She appears well-developed and well-nourished.  Eyes: Pupils are equal, round, and reactive to light.  Neck: Neck supple. No JVD present. No thyromegaly present.  Cardiovascular: Normal rate and regular rhythm. Exam reveals no gallop.  Pulmonary/Chest: Effort normal. No respiratory distress. She has no rales.  Couple of very faint wheezes initially but these cleared with deep breathing. No rales. Pulse oximetry 95%  Musculoskeletal: She exhibits no edema.  Neurological: She is alert and oriented to person, place, and time.       Assessment:     #1 hypertension improved with recent increase in amlodipine  #2 cough. Suspect acute bronchitis. Possible mild reactive airway component. No respiratory distress    Plan:     -Continue Mucinex twice daily and good hydration -Brief prednisone taper over the next 6 days -Zithromax for 5 days -Touch base if cough not resolving next couple weeks and sooner for any fever or increased shortness of breath  Eulas Post MD [facility] at [facility]

## 2017-09-11 NOTE — Patient Instructions (Addendum)
Your blood pressure is improved! Follow up for any fever, increased shortness of breath, or persistent cough.

## 2017-10-09 ENCOUNTER — Encounter: Payer: Self-pay | Admitting: Family Medicine

## 2017-10-09 ENCOUNTER — Ambulatory Visit: Payer: Medicare Other | Admitting: Family Medicine

## 2017-10-09 VITALS — BP 140/80 | HR 75 | Temp 98.6°F | Wt 139.5 lb

## 2017-10-09 DIAGNOSIS — R059 Cough, unspecified: Secondary | ICD-10-CM

## 2017-10-09 DIAGNOSIS — R05 Cough: Secondary | ICD-10-CM

## 2017-10-09 MED ORDER — PREDNISONE 10 MG PO TABS: ORAL_TABLET | ORAL | 0 refills | Status: DC

## 2017-10-09 MED ORDER — ALBUTEROL SULFATE HFA 108 (90 BASE) MCG/ACT IN AERS: 2.0000 | INHALATION_SPRAY | Freq: Four times a day (QID) | RESPIRATORY_TRACT | 0 refills | Status: DC | PRN

## 2017-10-09 NOTE — Patient Instructions (Signed)
Follow up for any fever or increasing shortness of breath. 

## 2017-10-09 NOTE — Progress Notes (Signed)
Subjective:     Patient ID: Molly Patterson, female   DOB: 02/01/1932, 84 y.o.   MRN: [id number]  HPI   Patient is nonsmoker who is seen with cough. She was actually seen with cough back in December and we treated her with Zithromax and prednisone and she states her cough had pretty much resolved. She then developed what she described as a likely viral URI about 2 weeks ago. Cough since that time has been mostly nonproductive. No fever. Some increased fatigue. Tried some over-the-counter Mucinex. She feels she may have some mild wheezing intermittently.  She had recent visit to urgent care around November and had chest x-ray which did not show any acute changes.  Past Medical History:  Diagnosis Date  . Allergy    hayfever  . ANEMIA-IRON DEFICIENCY 08/02/2009  . BURSITIS, HIP 08/02/2009  . HYPERTENSION 08/02/2009  . OSTEOARTHRITIS, GENERALIZED 08/12/2010  . Raynaud's syndrome 08/12/2010  . Transfusion history 1964   Past Surgical History:  Procedure Laterality Date  . APPENDECTOMY    . TONSILLECTOMY      reports that  has never smoked. she has never used smokeless tobacco. She reports that she does not drink alcohol. Her drug history is not on file. family history is not on file. Allergies  Allergen Reactions  . Lidocaine-Epinephrine     REACTION: pulse rapic  . Penicillins     REACTION: hives  . Rofecoxib     Vioxx, itiching     Review of Systems  Constitutional: Negative for appetite change, chills, fever and unexpected weight change.  HENT: Negative for sore throat.   Respiratory: Positive for cough. Negative for shortness of breath.   Cardiovascular: Negative for chest pain.       Objective:   Physical Exam  Constitutional: She appears well-developed and well-nourished.  HENT:  Right Ear: External ear normal.  Left Ear: External ear normal.  Mouth/Throat: Oropharynx is clear and moist.  Neck: Neck supple.  Cardiovascular: Normal rate and regular rhythm.   Pulmonary/Chest: Effort normal and breath sounds normal. No respiratory distress. She has no wheezes. She has no rales.  Lymphadenopathy:    She has no cervical adenopathy.       Assessment:     Cough. Suspect acute viral bronchitis. Possible mild reactive airway component    Plan:     -Prednisone taper starting at 40 mg daily -Albuterol inhaler 2 puffs every 4-6 hours as needed for severe cough and wheezing -Follow-up promptly for any fever or increasing shortness of breath  Eulas Post MD [facility]

## 2017-11-02 ENCOUNTER — Ambulatory Visit: Payer: Medicare Other | Admitting: Family Medicine

## 2017-11-02 ENCOUNTER — Encounter: Payer: Self-pay | Admitting: Family Medicine

## 2017-11-02 ENCOUNTER — Other Ambulatory Visit: Payer: Self-pay | Admitting: Family Medicine

## 2017-11-02 ENCOUNTER — Ambulatory Visit (INDEPENDENT_AMBULATORY_CARE_PROVIDER_SITE_OTHER): Payer: Medicare Other

## 2017-11-02 VITALS — BP 122/70 | HR 84 | Temp 97.9°F | Wt 139.3 lb

## 2017-11-02 DIAGNOSIS — S93402A Sprain of unspecified ligament of left ankle, initial encounter: Secondary | ICD-10-CM

## 2017-11-02 DIAGNOSIS — M25572 Pain in left ankle and joints of left foot: Secondary | ICD-10-CM

## 2017-11-02 DIAGNOSIS — S8265XA Nondisplaced fracture of lateral malleolus of left fibula, initial encounter for closed fracture: Secondary | ICD-10-CM | POA: Diagnosis not present

## 2017-11-02 NOTE — Progress Notes (Signed)
Subjective:     Patient ID: Molly Patterson, female   DOB: 1931-12-31, 84 y.o.   MRN: [id number]  HPI Acute visit for left ankle pain. She states Friday night she fell asleep on the sofa. She woke up and got up to walk before she also foot was "asleep ". She thinks in the process of that she twisted her ankle. She had some swelling and pain afterward especially lateral ankle. She had difficulty with weightbearing the first day but by Sunday was much better. She's been elevating and applying ice. She had Aircast from previous injury but was unable aware that because of pain. She states she has very little pain with weightbearing today. She does have substantial bruising.  Past Medical History:  Diagnosis Date  . Allergy    hayfever  . ANEMIA-IRON DEFICIENCY 08/02/2009  . BURSITIS, HIP 08/02/2009  . HYPERTENSION 08/02/2009  . OSTEOARTHRITIS, GENERALIZED 08/12/2010  . Raynaud's syndrome 08/12/2010  . Transfusion history 1964   Past Surgical History:  Procedure Laterality Date  . APPENDECTOMY    . TONSILLECTOMY      reports that  has never smoked. she has never used smokeless tobacco. She reports that she does not drink alcohol. Her drug history is not on file. family history is not on file. Allergies  Allergen Reactions  . Lidocaine-Epinephrine     REACTION: pulse rapic  . Penicillins     REACTION: hives  . Rofecoxib     Vioxx, itiching     Review of Systems  Neurological: Negative for weakness.       Objective:   Physical Exam  Constitutional: She appears well-developed and well-nourished.  Cardiovascular: Normal rate and regular rhythm.  Musculoskeletal:  Patient has extensive ecchymosis left foot and ankle region especially laterally. She has some very mild tenderness over the distal fibula. Achilles is intact and nontender. No fifth metatarsal tenderness. She has no pain with plantarflexion or dorsiflexion.       Assessment:     Left ankle injury. Pain has  improved significantly over the past couple days which is encouraging. She did have some initial quit a tenderness over the left lateral fibula and mild tenderness today    Plan:     -Obtain x-rays to rule out fracture -Continue with ice, elevation, compression. Four-inch Ace wrap was applied today  Eulas Post MD [facility]

## 2017-11-02 NOTE — Patient Instructions (Signed)
Ankle Sprain An ankle sprain is a stretch or tear in one of the tough, fiber-like tissues (ligaments) in the ankle. The ligaments in your ankle help to hold the bones of the ankle together. What are the causes? This condition is often caused by stepping on or falling on the outer edge of the foot. What increases the risk? This condition is more likely to develop in people who play sports. What are the signs or symptoms? Symptoms of this condition include:  Pain in your ankle.  Swelling.  Bruising. Bruising may develop right after you sprain your ankle or 1-2 days later.  Trouble standing or walking, especially when you turn or change directions.  How is this diagnosed? This condition is diagnosed with a physical exam. During the exam, your health care provider will press on certain parts of your foot and ankle and try to move them in certain ways. X-rays may be taken to see how severe the sprain is and to check for broken bones. How is this treated? This condition may be treated with:  A brace. This is used to keep the ankle from moving until it heals.  An elastic bandage. This is used to support the ankle.  Crutches.  Pain medicine.  Surgery. This may be needed if the sprain is severe.  Physical therapy. This may help to improve the range of motion in the ankle.  Follow these instructions at home:  Rest your ankle.  Take over-the-counter and prescription medicines only as told by your health care provider.  For 2-3 days, keep your ankle raised (elevated) above the level of your heart as much as possible.  If directed, apply ice to the area: ? Put ice in a plastic bag. ? Place a towel between your skin and the bag. ? Leave the ice on for 20 minutes, 2-3 times a day.  If you were given a brace: ? Wear it as directed. ? Remove it to shower or bathe. ? Try not to move your ankle much, but wiggle your toes from time to time. This helps to prevent swelling.  If you were  given an elastic bandage (dressing): ? Remove it to shower or bathe. ? Try not to move your ankle much, but wiggle your toes from time to time. This helps to prevent swelling. ? Adjust the dressing to make it more comfortable if it feels too tight. ? Loosen the dressing if you have numbness or tingling in your foot, or if your foot becomes cold and blue.  If you have crutches, use them as told by your health care provider. Continue to use them until you can walk without feeling pain in your ankle. Contact a health care provider if:  You have rapidly increasing bruising or swelling.  Your pain is not relieved with medicine. Get help right away if:  Your toes or foot becomes numb or blue.  You have severe pain that gets worse. This information is not intended to replace advice given to you by your health care provider. Make sure you discuss any questions you have with your health care provider. Document Released: 09/01/2005 Document Revised: 10/09/2016 Document Reviewed: 04/03/2015 Elsevier Interactive Patient Education  2018 [employer]  

## 2017-11-06 ENCOUNTER — Other Ambulatory Visit: Payer: Self-pay | Admitting: Family Medicine

## 2017-11-06 NOTE — Telephone Encounter (Signed)
Last office visit 11/02/17 and last refill 08/19/17.  Okay to fill?

## 2017-11-06 NOTE — Telephone Encounter (Signed)
Refills OK. 

## 2017-11-17 ENCOUNTER — Ambulatory Visit (INDEPENDENT_AMBULATORY_CARE_PROVIDER_SITE_OTHER): Payer: Medicare Other

## 2017-11-17 ENCOUNTER — Encounter: Payer: Self-pay | Admitting: Family Medicine

## 2017-11-17 ENCOUNTER — Ambulatory Visit: Payer: Medicare Other | Admitting: Family Medicine

## 2017-11-17 VITALS — BP 120/80 | HR 94 | Temp 98.1°F | Wt 138.5 lb

## 2017-11-17 DIAGNOSIS — S8265XD Nondisplaced fracture of lateral malleolus of left fibula, subsequent encounter for closed fracture with routine healing: Secondary | ICD-10-CM

## 2017-11-17 DIAGNOSIS — S8262XA Displaced fracture of lateral malleolus of left fibula, initial encounter for closed fracture: Secondary | ICD-10-CM | POA: Diagnosis not present

## 2017-11-17 NOTE — Patient Instructions (Signed)
Continue with air cast for another 2-3 weeks.

## 2017-11-17 NOTE — Progress Notes (Signed)
Subjective:     Patient ID: Molly Patterson, female   DOB: 12/12/1931, 84 y.o.   MRN: [id number]  HPI Patient seen for follow-up regarding recent left ankle injury. She had nondisplaced distal left lateral malleolar fracture. We offered orthopedic follow-up and she declined. She had Aircast she's been wearing daily. She states her ankle is much better but she still has some soreness. She still has some mild swelling. She's been elevating and occasionally icing. Also using Ace wrap.  Initial injury occurred after her foot "fell asleep "and then she got up to walk and twisted her ankle.  Past Medical History:  Diagnosis Date  . Allergy    hayfever  . ANEMIA-IRON DEFICIENCY 08/02/2009  . BURSITIS, HIP 08/02/2009  . HYPERTENSION 08/02/2009  . OSTEOARTHRITIS, GENERALIZED 08/12/2010  . Raynaud's syndrome 08/12/2010  . Transfusion history 1964   Past Surgical History:  Procedure Laterality Date  . APPENDECTOMY    . TONSILLECTOMY      reports that  has never smoked. she has never used smokeless tobacco. She reports that she does not drink alcohol. Her drug history is not on file. family history is not on file. Allergies  Allergen Reactions  . Lidocaine-Epinephrine     REACTION: pulse rapic  . Penicillins     REACTION: hives  . Rofecoxib     Vioxx, itiching     Review of Systems  Neurological: Negative for weakness and numbness.       Objective:   Physical Exam  Constitutional: She appears well-developed and well-nourished.  Cardiovascular: Normal rate.  Pulmonary/Chest: Effort normal and breath sounds normal. No respiratory distress. She has no wheezes. She has no rales.  Musculoskeletal:  Left ankle reveals some tenderness along the distal lateral malleolus. Minimal swelling. No visible ecchymosis. Achilles intact and nontender. Minimal tenderness distal medial malleolus       Assessment:     Nondisplaced closed fracture left lateral malleolus    Plan:      -Follow-up x-rays today -Continue Aircast for additional support for least another 2-3 weeks  Eulas Post MD  Primary Care at [facility]

## 2018-01-18 ENCOUNTER — Other Ambulatory Visit: Payer: Self-pay | Admitting: Family Medicine

## 2018-01-18 NOTE — Telephone Encounter (Signed)
Last refill 11/06/17 and last office visit 11/17/17

## 2018-01-18 NOTE — Telephone Encounter (Signed)
Refill OK

## 2018-01-29 ENCOUNTER — Other Ambulatory Visit: Payer: Self-pay | Admitting: Family Medicine

## 2018-03-16 ENCOUNTER — Ambulatory Visit: Payer: Medicare Other | Admitting: Family Medicine

## 2018-03-16 ENCOUNTER — Encounter: Payer: Self-pay | Admitting: Family Medicine

## 2018-03-16 VITALS — BP 140/80 | HR 100 | Temp 98.6°F | Wt 140.7 lb

## 2018-03-16 DIAGNOSIS — M7062 Trochanteric bursitis, left hip: Secondary | ICD-10-CM

## 2018-03-16 DIAGNOSIS — M159 Polyosteoarthritis, unspecified: Secondary | ICD-10-CM

## 2018-03-16 DIAGNOSIS — S82832A Other fracture of upper and lower end of left fibula, initial encounter for closed fracture: Secondary | ICD-10-CM

## 2018-03-16 DIAGNOSIS — I10 Essential (primary) hypertension: Secondary | ICD-10-CM

## 2018-03-16 DIAGNOSIS — M150 Primary generalized (osteo)arthritis: Secondary | ICD-10-CM | POA: Diagnosis not present

## 2018-03-16 DIAGNOSIS — M8949 Other hypertrophic osteoarthropathy, multiple sites: Secondary | ICD-10-CM

## 2018-03-16 DIAGNOSIS — M7061 Trochanteric bursitis, right hip: Secondary | ICD-10-CM | POA: Diagnosis not present

## 2018-03-16 MED ORDER — METHYLPREDNISOLONE ACETATE 40 MG/ML IJ SUSP
40.0000 mg | Freq: Once | INTRAMUSCULAR | Status: AC
  Administered 2018-03-16: 40 mg via INTRA_ARTICULAR

## 2018-03-16 NOTE — Patient Instructions (Signed)
Bursitis Bursitis is inflammation and irritation of a bursa, which is one of the small, fluid-filled sacs that cushion and protect the moving parts of your body. These sacs are located between bones and muscles, muscle attachments, or skin areas next to bones. A bursa protects these structures from the wear and tear that results from frequent movement. An inflamed bursa causes pain and swelling. Fluid may build up inside the sac. Bursitis is most common near joints, especially the knees, elbows, hips, and shoulders. What are the causes? Bursitis can be caused by:  Injury from: ? A direct blow, like falling on your knee or elbow. ? Overuse of a joint (repetitive stress).  Infection. This can happen if bacteria gets into a bursa through a cut or scrape near a joint.  Diseases that cause joint inflammation, such as gout and rheumatoid arthritis.  What increases the risk? You may be at risk for bursitis if you:  Have a job or hobby that involves a lot of repetitive stress on your joints.  Have a condition that weakens your body's defense system (immune system), such as diabetes, cancer, or HIV.  Lift and reach overhead often.  Kneel or lean on hard surfaces often.  Run or walk often.  What are the signs or symptoms? The most common signs and symptoms of bursitis are:  Pain that gets worse when you move the affected body part or put weight on it.  Inflammation.  Stiffness.  Other signs and symptoms may include:  Redness.  Tenderness.  Warmth.  Pain that continues after rest.  Fever and chills. This may occur in bursitis caused by infection.  How is this diagnosed? Bursitis may be diagnosed by:  Medical history and physical exam.  MRI.  A procedure to drain fluid from the bursa with a needle (aspiration). The fluid may be checked for signs of infection or gout.  Blood tests to rule out other causes of inflammation.  How is this treated? Bursitis can usually be  treated at home with rest, ice, compression, and elevation (RICE). For mild bursitis, RICE treatment may be all you need. Other treatments may include:  Nonsteroidal anti-inflammatory drugs (NSAIDs) to treat pain and inflammation.  Corticosteroids to fight inflammation. You may have these drugs injected into and around the area of bursitis.  Aspiration of bursitis fluid to relieve pain and improve movement.  Antibiotic medicine to treat an infected bursa.  A splint, brace, or walking aid.  Physical therapy if you continue to have pain or limited movement.  Surgery to remove a damaged or infected bursa. This may be needed if you have a very bad case of bursitis or if other treatments have not worked.  Follow these instructions at home:  Take medicines only as directed by your health care provider.  If you were prescribed an antibiotic medicine, finish it all even if you start to feel better.  Rest the affected area as directed by your health care provider. ? Keep the area elevated. ? Avoid activities that make pain worse.  Apply ice to the injured area: ? Place ice in a plastic bag. ? Place a towel between your skin and the bag. ? Leave the ice on for 20 minutes, 2-3 times a day.  Use splints, braces, pads, or walking aids as directed by your health care provider.  Keep all follow-up visits as directed by your health care provider. This is important. How is this prevented?  Wear knee pads if you kneel often.    Wear sturdy running or walking shoes that fit you well.  Take regular breaks from repetitive activity.  Warm up by stretching before doing any strenuous activity.  Maintain a healthy weight or lose weight as recommended by your health care provider. Ask your health care provider if you need help.  Exercise regularly. Start any new physical activity gradually. Contact a health care provider if:  Your bursitis is not responding to treatment or home care.  You have  a fever.  You have chills. This information is not intended to replace advice given to you by your health care provider. Make sure you discuss any questions you have with your health care provider. Document Released: 08/29/2000 Document Revised: 02/07/2016 Document Reviewed: 11/21/2013 Elsevier Interactive Patient Education  2018 Elsevier Inc.  

## 2018-03-16 NOTE — Progress Notes (Signed)
Subjective:     Patient ID: Molly Patterson, female   DOB: 1932-03-20, 84 y.o.   MRN: [id number]  HPI Patient here for several issues as follows  She's had some general progression of diffuse osteoarthritis pains recently. She has pain in her hands, knees, hips. She takes tramadol generally 1 every 8 hours  but occasionally taken up to 4 per day. She is trying to avoid regular use of nonsteroidals because of risk at her age. She has not gotten adequate relief with Tylenol.  Bilateral hip pain. History of bursitis in the past. She has bilateral pain but left greater than right. She's tried topical patches and heat and ice without relief  Patient has nondisplaced left fibular fracture following injury couple months ago. Overall improved symptomatically. Ambulating without pain. Occasional sensation of numbness dorsum left foot but no weakness. Mild swelling intermittently.  Hypertension treated with amlodipine and benazepril as well as HCTZ. No recent dizziness or headaches. No chest pains.  Past Medical History:  Diagnosis Date  . Allergy    hayfever  . ANEMIA-IRON DEFICIENCY 08/02/2009  . BURSITIS, HIP 08/02/2009  . HYPERTENSION 08/02/2009  . OSTEOARTHRITIS, GENERALIZED 08/12/2010  . Raynaud's syndrome 08/12/2010  . Transfusion history 1964   Past Surgical History:  Procedure Laterality Date  . APPENDECTOMY    . TONSILLECTOMY      reports that she has never smoked. She has never used smokeless tobacco. She reports that she does not drink alcohol. Her drug history is not on file. family history is not on file. Allergies  Allergen Reactions  . Lidocaine-Epinephrine     REACTION: pulse rapic  . Penicillins     REACTION: hives  . Rofecoxib     Vioxx, itiching     Review of Systems  Constitutional: Negative for fatigue.  Eyes: Negative for visual disturbance.  Respiratory: Negative for cough, chest tightness, shortness of breath and wheezing.   Cardiovascular: Negative  for chest pain, palpitations and leg swelling.  Genitourinary: Negative for dysuria.  Musculoskeletal: Positive for arthralgias.  Neurological: Negative for dizziness, seizures, syncope, weakness, light-headedness and headaches.       Objective:   Physical Exam  Constitutional: She appears well-developed and well-nourished.  Eyes: Pupils are equal, round, and reactive to light.  Neck: Neck supple. No JVD present. No thyromegaly present.  Cardiovascular: Normal rate and regular rhythm. Exam reveals no gallop.  Pulmonary/Chest: Effort normal and breath sounds normal. No respiratory distress. She has no wheezes. She has no rales.  Musculoskeletal: She exhibits no edema.  Excellent range of motion both hips. She has tenderness over the greater trochanteric bursa of both hips especially on the left side  Left ankle and leg are examined. She has good range of motion left ankle. No bony tenderness.  Neurological: She is alert.       Assessment:     #1 hypertension stable and at goal  #2 history of nondisplaced left distal fibular fracture several months ago clinically improving. Angulating without difficulty this time  #3 osteoarthritis involving multiple joints  #4 bilateral hip bursitis left greater than right    Plan:     -Continue current blood pressure medications  -Continue tramadol as needed for arthritis pain. She generally gets good relief but sometimes has breakthrough and has to take a fourth tablet per day which we stated was fine. She does not take any other serotonin medications. She is aware of risk of long-term nonsteroidal use  -We discussed risk and benefits of  steroid injection left hip from her bursa pain. She's got relief in the past. We discussed risk of bruising, bleeding, infection and patient consented. Left lateral hip region prepped with Betadine. Using 25-gauge one-inch needle injected 40 mg Depo-Medrol and 2 mL of plain Xylocaine and patient tolerated  well  Eulas Post MD [place] [facility] at [facility]'

## 2018-03-30 ENCOUNTER — Other Ambulatory Visit: Payer: Self-pay | Admitting: Family Medicine

## 2018-03-31 NOTE — Telephone Encounter (Signed)
Refill OK

## 2018-03-31 NOTE — Telephone Encounter (Signed)
Last refill 01/19/18 and last office visit 03/16/18.  Okay to fill?

## 2018-04-01 NOTE — Telephone Encounter (Signed)
Rx done. 

## 2018-05-01 ENCOUNTER — Other Ambulatory Visit: Payer: Self-pay | Admitting: Family Medicine

## 2018-06-21 ENCOUNTER — Other Ambulatory Visit: Payer: Self-pay | Admitting: Family Medicine

## 2018-06-21 NOTE — Telephone Encounter (Signed)
Last OV 03/16/18, No future OV  Last filled 04/01/18, # 180 with 0 refills  Please advise if okay to fill?

## 2018-06-21 NOTE — Telephone Encounter (Signed)
Refill once.  Have her set up follow up by Jan or Feb.

## 2018-07-13 ENCOUNTER — Encounter: Payer: Self-pay | Admitting: Family Medicine

## 2018-07-13 ENCOUNTER — Ambulatory Visit: Payer: Medicare Other | Admitting: Family Medicine

## 2018-07-13 ENCOUNTER — Other Ambulatory Visit: Payer: Self-pay

## 2018-07-13 VITALS — BP 128/72 | HR 108 | Temp 98.0°F | Ht 62.75 in | Wt 139.1 lb

## 2018-07-13 DIAGNOSIS — M150 Primary generalized (osteo)arthritis: Secondary | ICD-10-CM | POA: Diagnosis not present

## 2018-07-13 DIAGNOSIS — M8949 Other hypertrophic osteoarthropathy, multiple sites: Secondary | ICD-10-CM

## 2018-07-13 DIAGNOSIS — M76891 Other specified enthesopathies of right lower limb, excluding foot: Secondary | ICD-10-CM | POA: Diagnosis not present

## 2018-07-13 DIAGNOSIS — I10 Essential (primary) hypertension: Secondary | ICD-10-CM | POA: Diagnosis not present

## 2018-07-13 DIAGNOSIS — M159 Polyosteoarthritis, unspecified: Secondary | ICD-10-CM

## 2018-07-13 DIAGNOSIS — M76892 Other specified enthesopathies of left lower limb, excluding foot: Secondary | ICD-10-CM

## 2018-07-13 DIAGNOSIS — Z23 Encounter for immunization: Secondary | ICD-10-CM

## 2018-07-13 NOTE — Addendum Note (Signed)
Addended by: Sheffield Slider L on: 07/13/2018 10:50 AM   Modules accepted: Orders

## 2018-07-13 NOTE — Progress Notes (Signed)
  Subjective:     Patient ID: Molly Patterson, female   DOB: 07-13-1932, 84 y.o.   MRN: [id number]  HPI Patient is seen for medical follow-up.  She had bursitis in her hip last visit and had dramatic improvement after steroid injection.  She is doing well at this time and staying very active.  No pain with ambulation.  Hypertension treated with amlodipine, benazepril, and low-dose HCTZ.  Blood pressure stable.  No headaches.  No dizziness.  No chest pains.  No recent falls.  Osteoarthritis involving multiple joints.  She takes tramadol usually 1 to 2 at night and this seems to help her sleep through the night.  We are avoiding regular nonsteroidals because of her age.  She has not gotten adequate relief with Tylenol in the past.  Still needs flu vaccine  Past Medical History:  Diagnosis Date  . Allergy    hayfever  . ANEMIA-IRON DEFICIENCY 08/02/2009  . BURSITIS, HIP 08/02/2009  . HYPERTENSION 08/02/2009  . OSTEOARTHRITIS, GENERALIZED 08/12/2010  . Raynaud's syndrome 08/12/2010  . Transfusion history 1964   Past Surgical History:  Procedure Laterality Date  . APPENDECTOMY    . TONSILLECTOMY      reports that she has never smoked. She has never used smokeless tobacco. She reports that she does not drink alcohol. Her drug history is not on file. family history is not on file. Allergies  Allergen Reactions  . Lidocaine-Epinephrine     REACTION: pulse rapic  . Penicillins     REACTION: hives  . Rofecoxib     Vioxx, itiching     Review of Systems  Constitutional: Negative for fatigue and unexpected weight change.  Eyes: Negative for visual disturbance.  Respiratory: Negative for cough, chest tightness, shortness of breath and wheezing.   Cardiovascular: Negative for chest pain, palpitations and leg swelling.  Gastrointestinal: Negative for abdominal pain.  Musculoskeletal: Positive for arthralgias.  Neurological: Negative for dizziness, seizures, syncope, weakness,  light-headedness and headaches.       Objective:   Physical Exam  Constitutional: She is oriented to person, place, and time. She appears well-developed and well-nourished.  Neck: Neck supple.  Cardiovascular: Normal rate and regular rhythm.  Pulmonary/Chest: Effort normal and breath sounds normal. She has no wheezes. She has no rales.  Musculoskeletal: She exhibits no edema.  Neurological: She is alert and oriented to person, place, and time.  Psychiatric: She has a normal mood and affect. Her behavior is normal.       Assessment:     #1 hypertension stable and at goal  #2 primary osteoarthritis involving multiple sites.  She is doing well and stable on Ultram at night  #3 history of bursitis involving the hips improved following steroid injection    Plan:     -Flu vaccine given -Continue current medications -Discussed follow-up labs and she would like to defer at this time until next visit.  We will plan routine follow-up in 6 months and sooner as needed  Eulas Post MD [place] [facility] at [facility]

## 2018-07-20 ENCOUNTER — Other Ambulatory Visit: Payer: Self-pay | Admitting: Family Medicine

## 2018-08-04 ENCOUNTER — Other Ambulatory Visit: Payer: Self-pay

## 2018-08-04 ENCOUNTER — Ambulatory Visit: Payer: Medicare Other | Admitting: Family Medicine

## 2018-08-04 ENCOUNTER — Encounter: Payer: Self-pay | Admitting: Family Medicine

## 2018-08-04 VITALS — BP 126/80 | HR 100 | Temp 98.1°F | Ht 62.75 in | Wt 139.4 lb

## 2018-08-04 DIAGNOSIS — J209 Acute bronchitis, unspecified: Secondary | ICD-10-CM | POA: Diagnosis not present

## 2018-08-04 DIAGNOSIS — R062 Wheezing: Secondary | ICD-10-CM

## 2018-08-04 MED ORDER — PREDNISONE 10 MG PO TABS
ORAL_TABLET | ORAL | 0 refills | Status: DC
Start: 2018-08-04 — End: 2018-09-03

## 2018-08-04 MED ORDER — AZITHROMYCIN 250 MG PO TABS: ORAL_TABLET | ORAL | 0 refills | Status: AC

## 2018-08-04 NOTE — Progress Notes (Signed)
  Subjective:     Patient ID: Molly Patterson, female   DOB: Jan 20, 1932, 83 y.o.   MRN: [id number]  HPI Patient is non-smoker who was seen with over 3-week history of cough.  She has had some thick green mucus.  No fever.  Has tried over-the-counter Mucinex without much improvement.  She had similar type illness back in January last year which eventually required some prednisone and antibiotics for resolution.  She is penicillin allergic.  She has albuterol inhaler at home but only used once  Past Medical History:  Diagnosis Date  . Allergy    hayfever  . ANEMIA-IRON DEFICIENCY 08/02/2009  . BURSITIS, HIP 08/02/2009  . HYPERTENSION 08/02/2009  . OSTEOARTHRITIS, GENERALIZED 08/12/2010  . Raynaud's syndrome 08/12/2010  . Transfusion history 1964   Past Surgical History:  Procedure Laterality Date  . APPENDECTOMY    . TONSILLECTOMY      reports that she has never smoked. She has never used smokeless tobacco. She reports that she does not drink alcohol. Her drug history is not on file. family history is not on file. Allergies  Allergen Reactions  . Lidocaine-Epinephrine     REACTION: pulse rapic  . Penicillins     REACTION: hives  . Rofecoxib     Vioxx, itiching     Review of Systems  Constitutional: Negative for chills and fever.  HENT: Negative for congestion and sore throat.   Respiratory: Positive for cough and wheezing. Negative for shortness of breath.   Cardiovascular: Negative for chest pain.       Objective:   Physical Exam  Constitutional: She appears well-developed and well-nourished.  HENT:  Right Ear: External ear normal.  Left Ear: External ear normal.  Mouth/Throat: Oropharynx is clear and moist.  Neck: Neck supple.  Cardiovascular: Normal rate and regular rhythm.  Pulmonary/Chest: Effort normal.  She has some diffuse wheezes.  Pulse oximetry 95%.  No rales.  No retractions.  Lymphadenopathy:    She has no cervical adenopathy.       Assessment:      Patient presents with several week history of productive cough with reactive airway changes on exam but no respiratory distress    Plan:     -Continue albuterol inhaler as needed -Prednisone taper over the next week -Zithromax for 5 days -Follow-up immediately for any fever, increased shortness of breath, or other concerns  Eulas Post MD [place] [facility] at [facility]

## 2018-08-04 NOTE — Patient Instructions (Signed)
Follow up for any fever or increased shortness of breath. 

## 2018-09-01 ENCOUNTER — Ambulatory Visit: Payer: Self-pay | Admitting: *Deleted

## 2018-09-01 NOTE — Telephone Encounter (Signed)
Message from [name] sent at 09/01/2018 1:46 PM EST   Patient states that she is coughing and is asking Dr Elease Hashimoto to call her something else in so she doesn't have to come back to [facility]. She said it is a very congested cough. She said that she seen him on 11/20 and was diagnosed with Bronchitis.    Pt calling to see if Dr. Elease Hashimoto would call something in to the pharmacy so that she does not have to travel to [facility] for an office visit. Pt states she has been experiencing a cough since being diagnosed with bronchitis on 08/04/18. Pt states she hardly coughs but when she does it sounds "like a smoker's cough, but I don't smoke". Pt states that sometimes she has yellow/brownish tinged sputum. Pt states she has taken Coricidin and Mucinex off and on but the cough still remains. Pt states she can hear a wheeze sometimes that goes away with when she coughs. Pt denies any SOB or other symptoms at this time. Pt states " I am not sick" but would like to have Dr. Erick Blinks recommendations on what to do about the cough. If prescription can be called in pt would like to use [employer].   Reason for Disposition . Cough has been present for > 3 weeks  Answer Assessment - Initial Assessment Questions 1. ONSET: "When did the cough begin?"      Pt states she was diagnosed with bronchitis on 11/20 and the cough has remained since that time 2. SEVERITY: "How bad is the cough today?"      Sounds like a bad smoker's cough but I do not smoke. It feels like I can get something out but sometimes nothing comes out 3. RESPIRATORY DISTRESS: "Describe your breathing."      Denies any SOB or difficulty breathing 4. FEVER: "Do you have a fever?" If so, ask: "What is your temperature, how was it measured, and when did it start?"     No 5. HEMOPTYSIS: "Are you coughing up any blood?" If so ask: "How much?" (flecks, streaks, tablespoons, etc.)     No 6. TREATMENT: "What have you done so  far to treat the cough?" (e.g., meds, fluids, humidifier)     Pt states she has been using Coricidin and Mucinex off and on 7. CARDIAC HISTORY: "Do you have any history of heart disease?" (e.g., heart attack, congestive heart failure)      Hx of HTN 8. LUNG HISTORY: "Do you have any history of lung disease?"  (e.g., pulmonary embolus, asthma, emphysema)     No 9. PE RISK FACTORS: "Do you have a history of blood clots?" (or: recent major surgery, recent prolonged travel, bedridden)     No 10. OTHER SYMPTOMS: "Do you have any other symptoms? (e.g., runny nose, wheezing, chest pain)       can hear a wheeze every now and then and goes away with coughing. Pt states that this was also heard during visit on 08/04/18 11. PREGNANCY: "Is there any chance you are pregnant?" "When was your last menstrual period?"       n/a 12. TRAVEL: "Have you traveled out of the country in the last month?" (e.g., travel history, exposures)       no  Protocols used: COUGH - ACUTE NON-PRODUCTIVE-A-AH

## 2018-09-01 NOTE — Telephone Encounter (Signed)
I really feel she probably needs a CXR if coughing up brown sputum still and wheezing- especially at her age..  Offer follow up to reassess.

## 2018-09-01 NOTE — Telephone Encounter (Signed)
Called patient and spoke to her husband Fritz Pickerel and gave him message from Dr. Elease Hashimoto and Fritz Pickerel stated she is at church and he will have her call in the morning to see if she wants to see a different provider or try to see [facility] on Friday.

## 2018-09-01 NOTE — Telephone Encounter (Signed)
Please see message.  Please advise. 

## 2018-09-03 ENCOUNTER — Other Ambulatory Visit: Payer: Self-pay

## 2018-09-03 ENCOUNTER — Ambulatory Visit: Payer: Medicare Other | Admitting: Family Medicine

## 2018-09-03 ENCOUNTER — Encounter: Payer: Self-pay | Admitting: Family Medicine

## 2018-09-03 VITALS — BP 120/68 | HR 85 | Temp 98.2°F | Ht 62.75 in | Wt 137.5 lb

## 2018-09-03 DIAGNOSIS — R062 Wheezing: Secondary | ICD-10-CM | POA: Diagnosis not present

## 2018-09-03 DIAGNOSIS — R05 Cough: Secondary | ICD-10-CM

## 2018-09-03 DIAGNOSIS — R059 Cough, unspecified: Secondary | ICD-10-CM

## 2018-09-03 MED ORDER — PREDNISONE 10 MG PO TABS: ORAL_TABLET | ORAL | 0 refills | Status: DC

## 2018-09-03 NOTE — Patient Instructions (Signed)
Let me know if cough not resolving over the next week.  Follow up sooner for any fever or other concerns.

## 2018-09-03 NOTE — Progress Notes (Signed)
  Subjective:     Patient ID: Molly Patterson, female   DOB: February 28, 1932, 84 y.o.   MRN: [id number]  HPI Patient seen with some recurrent cough.  She was seen here on the 20th with some reactive airway changes on exam and we gave her prednisone taper and Zithromax.  Her cough did improve but then about a week ago started coughing more again.  Nonproductive.  No fever.  No chills.  No hemoptysis.  Patient relates she had chest x-ray last year urgent care which was unremarkable.  She has used albuterol without much improvement.    Past Medical History:  Diagnosis Date  . Allergy    hayfever  . ANEMIA-IRON DEFICIENCY 08/02/2009  . BURSITIS, HIP 08/02/2009  . HYPERTENSION 08/02/2009  . OSTEOARTHRITIS, GENERALIZED 08/12/2010  . Raynaud's syndrome 08/12/2010  . Transfusion history 1964   Past Surgical History:  Procedure Laterality Date  . APPENDECTOMY    . TONSILLECTOMY      reports that she has never smoked. She has never used smokeless tobacco. She reports that she does not drink alcohol. No history on file for drug. family history is not on file. Allergies  Allergen Reactions  . Lidocaine-Epinephrine     REACTION: pulse rapic  . Penicillins     REACTION: hives  . Rofecoxib     Vioxx, itiching     Review of Systems  Constitutional: Negative for chills and fever.  Respiratory: Positive for cough and wheezing.   Cardiovascular: Negative for chest pain.       Objective:   Physical Exam Constitutional:      Appearance: Normal appearance.  Cardiovascular:     Rate and Rhythm: Normal rate and regular rhythm.  Pulmonary:     Effort: Pulmonary effort is normal.     Comments: Patient does have some diffuse expiratory wheezes.  No retractions.  No rales.  Pulse oximetry 95% Neurological:     Mental Status: She is alert.        Assessment:     Patient presents with approximately 1 month history of cough.  No fever but does have some recurrent reactive airway changes.   Not limiting daily activities.  No respiratory distress.    Plan:     -Recommend another taper of prednisone.  If cough not fully resolved over the next couple weeks with prednisone consider steroid inhaler and further lung function testing and evaluation. -Follow-up promptly for any fever or other concerns  Eulas Post MD [place] [facility] at [facility]

## 2018-09-17 ENCOUNTER — Other Ambulatory Visit: Payer: Self-pay | Admitting: Family Medicine

## 2018-09-17 NOTE — Telephone Encounter (Signed)
Last OV 09/03/18, No future OV  Last filled 06/21/18, # 180 with 0 refills  OK to continue?

## 2018-09-17 NOTE — Telephone Encounter (Signed)
Refill OK

## 2018-10-12 ENCOUNTER — Other Ambulatory Visit: Payer: Self-pay

## 2018-10-12 ENCOUNTER — Encounter: Payer: Self-pay | Admitting: Family Medicine

## 2018-10-12 ENCOUNTER — Ambulatory Visit: Payer: Medicare Other | Admitting: Family Medicine

## 2018-10-12 VITALS — BP 132/78 | HR 97 | Temp 98.1°F | Ht 62.75 in | Wt 139.0 lb

## 2018-10-12 DIAGNOSIS — R062 Wheezing: Secondary | ICD-10-CM | POA: Diagnosis not present

## 2018-10-12 MED ORDER — PREDNISONE 10 MG PO TABS: ORAL_TABLET | ORAL | 0 refills | Status: DC

## 2018-10-12 NOTE — Patient Instructions (Signed)
Follow up for any fever or increased shortness of breath. 

## 2018-10-12 NOTE — Progress Notes (Signed)
  Subjective:     Patient ID: Molly Patterson, female   DOB: 1932/02/20, 85 y.o.   MRN: [id number]  HPI Patient seen with recurrent cough.  She had some reactive airway changes when she was seen here previously and after course of prednisone her cough fully resolved.  About 2 weeks ago she was visiting son in [place] and developed some recurrent cough.  She is aware of some wheezing.  She has some mild dyspnea with things like going upstairs.  No fever.  Cough occasionally productive of clear mucus.  She is aware of some postnasal drip symptoms.  She has some Claritin but is not taking any.  No pleuritic pain.  Non-smoker.  No appetite or weight changes  Past Medical History:  Diagnosis Date  . Allergy    hayfever  . ANEMIA-IRON DEFICIENCY 08/02/2009  . BURSITIS, HIP 08/02/2009  . HYPERTENSION 08/02/2009  . OSTEOARTHRITIS, GENERALIZED 08/12/2010  . Raynaud's syndrome 08/12/2010  . Transfusion history 1964   Past Surgical History:  Procedure Laterality Date  . APPENDECTOMY    . TONSILLECTOMY      reports that she has never smoked. She has never used smokeless tobacco. She reports that she does not drink alcohol. No history on file for drug. family history is not on file. Allergies  Allergen Reactions  . Lidocaine-Epinephrine     REACTION: pulse rapic  . Penicillins     REACTION: hives  . Rofecoxib     Vioxx, itiching     Review of Systems  Constitutional: Negative for chills and fever.  HENT: Positive for postnasal drip. Negative for sore throat.   Respiratory: Positive for cough and wheezing.   Cardiovascular: Negative for chest pain.       Objective:   Physical Exam Constitutional:      Appearance: Normal appearance.  HENT:     Right Ear: Tympanic membrane normal.     Left Ear: Tympanic membrane normal.     Mouth/Throat:     Pharynx: Oropharynx is clear.  Cardiovascular:     Rate and Rhythm: Normal rate and regular rhythm.  Pulmonary:   Comments: He has some diffuse faint expiratory wheezes.  No rales.  Pulse oximetry 94%.  Normal respiratory rate at rest.  No retractions Musculoskeletal:     Right lower leg: No edema.     Left lower leg: No edema.  Neurological:     Mental Status: She is alert.        Assessment:     Cough with some mild reactive airway changes.  No respiratory distress.  Question viral trigger    Plan:     -Prednisone tapering from 40 mg daily over the next 8 days. -Follow-up immediately for any fever or increasing shortness of breath -If wheezing and cough not resolved within the next week we will follow-up for chest x-ray and further evaluation.  Consider possible steroid inhaler if she continues to have recurrence of reactive airway changes  Eulas Post MD [facility] at [facility]

## 2018-10-18 ENCOUNTER — Other Ambulatory Visit: Payer: Self-pay | Admitting: Family Medicine

## 2018-11-23 ENCOUNTER — Other Ambulatory Visit: Payer: Self-pay

## 2018-11-23 ENCOUNTER — Encounter: Payer: Self-pay | Admitting: Family Medicine

## 2018-11-23 ENCOUNTER — Ambulatory Visit (INDEPENDENT_AMBULATORY_CARE_PROVIDER_SITE_OTHER): Payer: Medicare Other | Admitting: Family Medicine

## 2018-11-23 VITALS — BP 118/76 | HR 99 | Temp 98.1°F | Ht 62.75 in | Wt 139.8 lb

## 2018-11-23 DIAGNOSIS — R0982 Postnasal drip: Secondary | ICD-10-CM

## 2018-11-23 DIAGNOSIS — R05 Cough: Secondary | ICD-10-CM

## 2018-11-23 DIAGNOSIS — L821 Other seborrheic keratosis: Secondary | ICD-10-CM

## 2018-11-23 DIAGNOSIS — R059 Cough, unspecified: Secondary | ICD-10-CM

## 2018-11-23 MED ORDER — AZELASTINE HCL 0.1 % NA SOLN: 1.0000 | Freq: Two times a day (BID) | NASAL | 12 refills | Status: DC

## 2018-11-23 NOTE — Progress Notes (Signed)
Subjective:     Patient ID: Molly Patterson, female   DOB: Mar 27, 1932, 85 y.o.   MRN: [id number]  HPI Patient seen for the following issues  She had cough couple times here recently and had reactive airway changes both times.  On each occasion she improved following prednisone.  She had been to urgent care last fall and had chest x-ray which was unremarkable.  She thinks her current cough is not related to the previous episodes.  She was doing better following last visit but then developed some increased postnasal drip symptoms recently.  She thinks this is related to spring pollen.  No fever.  Cough is mostly dry.  She takes Claritin daily but does not think this is working well.  She has occasionally taken Nasonex in the past.  No facial pain.  No dyspnea.  Second issue is she has irritated hyperkeratotic lesion left upper lid.  Bothersome because of location.  Frequently itches.  Past Medical History:  Diagnosis Date  . Allergy    hayfever  . ANEMIA-IRON DEFICIENCY 08/02/2009  . BURSITIS, HIP 08/02/2009  . HYPERTENSION 08/02/2009  . OSTEOARTHRITIS, GENERALIZED 08/12/2010  . Raynaud's syndrome 08/12/2010  . Transfusion history 1964   Past Surgical History:  Procedure Laterality Date  . APPENDECTOMY    . TONSILLECTOMY      reports that she has never smoked. She has never used smokeless tobacco. She reports that she does not drink alcohol. No history on file for drug. family history is not on file. Allergies  Allergen Reactions  . Lidocaine-Epinephrine     REACTION: pulse rapic  . Penicillins     REACTION: hives  . Rofecoxib     Vioxx, itiching     Review of Systems  Constitutional: Negative for chills and fever.  HENT: Positive for postnasal drip. Negative for nosebleeds, sinus pressure, sinus pain and sore throat.   Respiratory: Positive for cough. Negative for shortness of breath.   Cardiovascular: Negative for chest pain.       Objective:   Physical  Exam Constitutional:      Appearance: Normal appearance.  HENT:     Right Ear: Tympanic membrane normal.     Left Ear: Tympanic membrane normal.  Neck:     Musculoskeletal: Neck supple.  Cardiovascular:     Rate and Rhythm: Normal rate and regular rhythm.  Pulmonary:     Effort: Pulmonary effort is normal.     Breath sounds: Normal breath sounds. No wheezing or rales.  Skin:    Comments: She has verrucous hyperkeratotic skin lesion left upper eyelid region.  Approximately 5 mm diameter at base.  Neurological:     Mental Status: She is alert.        Assessment:     #1 probable allergic postnasal drip related cough.  No respiratory distress.  No evidence for sinusitis  #2 irritated verrucous keratosis left upper eyelid.    Plan:     -She will try change of over-the-counter antihistamine to Allegra, Zyrtec, or Xyzal.  We also printed prescription for Astelin nasal 1 spray per nostril twice daily as needed if not relieved with the above  -Follow-up promptly for any fever, increased shortness of breath, or other concerns.  -Discussed risk and benefits of treatment with liquid nitrogen to irritated keratosis lesion left upper lid.  Patient consented.  This was treated with liquid nitrogen without difficulty.  Touch base if not resolving next couple weeks  Eulas Post MD [facility]  at [facility]

## 2018-11-23 NOTE — Patient Instructions (Addendum)
Consider change from Claritan to Allegra, Zyrtec, or Xyzal.    If postnasal drip not better with the above add the Astelin.

## 2018-12-13 ENCOUNTER — Telehealth: Payer: Self-pay

## 2018-12-13 ENCOUNTER — Other Ambulatory Visit: Payer: Self-pay | Admitting: Family Medicine

## 2018-12-13 NOTE — Telephone Encounter (Signed)
Author phoned pt. to assess interest in scheduling virtual awv. Phone asked for remote access code, unable to contact pt.

## 2018-12-14 NOTE — Telephone Encounter (Signed)
Refill OK

## 2018-12-14 NOTE — Telephone Encounter (Signed)
Last OV 11/23/18, No future OV  Last filled 1/3/ 20, # 180 with 0 refill

## 2018-12-15 NOTE — Telephone Encounter (Signed)
Please send through your system. Thank you!

## 2019-01-15 ENCOUNTER — Other Ambulatory Visit: Payer: Self-pay | Admitting: Family Medicine

## 2019-01-17 ENCOUNTER — Ambulatory Visit (INDEPENDENT_AMBULATORY_CARE_PROVIDER_SITE_OTHER): Payer: Medicare Other | Admitting: Family Medicine

## 2019-01-17 ENCOUNTER — Other Ambulatory Visit: Payer: Self-pay

## 2019-01-17 DIAGNOSIS — I10 Essential (primary) hypertension: Secondary | ICD-10-CM | POA: Diagnosis not present

## 2019-01-17 MED ORDER — AMLODIPINE BESYLATE 10 MG PO TABS: ORAL_TABLET | ORAL | 3 refills | Status: DC

## 2019-01-17 MED ORDER — HYDROCHLOROTHIAZIDE 12.5 MG PO CAPS: ORAL_CAPSULE | ORAL | 3 refills | Status: DC

## 2019-01-17 MED ORDER — BENAZEPRIL HCL 10 MG PO TABS: ORAL_TABLET | ORAL | 3 refills | Status: DC

## 2019-01-17 NOTE — Telephone Encounter (Signed)
Patient has an appointment today at 10am for med refills and BP follow up. Telephone only.

## 2019-01-17 NOTE — Progress Notes (Signed)
This visit type was conducted due to national recommendations for restrictions regarding the COVID-19 pandemic in an effort to limit this patient's exposure and mitigate transmission in our community.   Virtual Visit via Telephone Note  I connected with Molly Patterson on 01/17/19 at 10:00 AM EDT by telephone and verified that I am speaking with the correct person using two identifiers.   I discussed the limitations, risks, security and privacy concerns of performing an evaluation and management service by telephone and the availability of in person appointments. I also discussed with the patient that there may be a patient responsible charge related to this service. The patient expressed understanding and agreed to proceed.  Location patient: home Location provider: work or home office Participants present for the call: patient, provider Patient did not have a visit in the prior 7 days to address this/these issue(s).   History of Present Illness: Patient had scheduled follow-up for hypertension.  She takes amlodipine, benazepril, and HCTZ.  Home blood pressures been very stable.  No dizziness.  No headaches.  No chest pains.  She is overdue for follow-up labs.  Last electrolytes were November 2018.   Observations/Objective: Patient sounds cheerful and well on the phone. I do not appreciate any SOB. Speech and thought processing are grossly intact. Patient reported vitals:  Assessment and Plan: Hypertension- stable.  She does need follow-up labs and we will plan to hopefully schedule office follow-up in 3 to 4 months.  Refilled medications for 1 year  Follow Up Instructions:  3-4 month.   I did not refer this patient for an OV in the next 24 hours for this/these issue(s).  I discussed the assessment and treatment plan with the patient. The patient was provided an opportunity to ask questions and all were answered. The patient agreed with the plan and demonstrated an understanding  of the instructions.   The patient was advised to call back or seek an in-person evaluation if the symptoms worsen or if the condition fails to improve as anticipated.  I provided 12 minutes of non-face-to-face time during this encounter.   Carolann Littler, MD

## 2019-05-12 ENCOUNTER — Other Ambulatory Visit: Payer: Self-pay | Admitting: Family Medicine

## 2019-06-22 ENCOUNTER — Other Ambulatory Visit: Payer: Self-pay

## 2019-06-22 ENCOUNTER — Ambulatory Visit (INDEPENDENT_AMBULATORY_CARE_PROVIDER_SITE_OTHER): Payer: Medicare Other | Admitting: Family Medicine

## 2019-06-22 ENCOUNTER — Encounter: Payer: Self-pay | Admitting: Family Medicine

## 2019-06-22 VITALS — BP 130/60 | HR 110 | Temp 98.2°F | Wt 133.6 lb

## 2019-06-22 DIAGNOSIS — M159 Polyosteoarthritis, unspecified: Secondary | ICD-10-CM

## 2019-06-22 DIAGNOSIS — R0989 Other specified symptoms and signs involving the circulatory and respiratory systems: Secondary | ICD-10-CM

## 2019-06-22 DIAGNOSIS — M8949 Other hypertrophic osteoarthropathy, multiple sites: Secondary | ICD-10-CM

## 2019-06-22 DIAGNOSIS — Z23 Encounter for immunization: Secondary | ICD-10-CM | POA: Diagnosis not present

## 2019-06-22 DIAGNOSIS — I10 Essential (primary) hypertension: Secondary | ICD-10-CM

## 2019-06-22 MED ORDER — PREDNISONE 10 MG PO TABS: ORAL_TABLET | ORAL | 0 refills | Status: DC

## 2019-06-22 NOTE — Progress Notes (Signed)
  Subjective:     Patient ID: Molly Patterson, female   DOB: 09-Jul-1932, 85 y.o.   MRN: [id number]  HPI Ms. Cosman is seen with some upper airway chest congestion and occasional cough.  Clear mucus.  No fever.  No chills.  No dyspnea.  No myalgias.  She had similar symptoms in the past and he thinks is mostly allergy related.  She is currently using Xyzal and also some Flonase.  She remains on tramadol for arthritis pains at night and has helped tremendously with her ability to sleep.  She has hypertension and her blood pressures have been stable.  No recent falls.  No chest pain.  Remains on Amlodipine, microzide, and benazepril.    Past Medical History:  Diagnosis Date  . Allergy    hayfever  . ANEMIA-IRON DEFICIENCY 08/02/2009  . BURSITIS, HIP 08/02/2009  . HYPERTENSION 08/02/2009  . OSTEOARTHRITIS, GENERALIZED 08/12/2010  . Raynaud's syndrome 08/12/2010  . Transfusion history 1964   Past Surgical History:  Procedure Laterality Date  . APPENDECTOMY    . TONSILLECTOMY      reports that she has never smoked. She has never used smokeless tobacco. She reports that she does not drink alcohol. No history on file for drug. family history is not on file. Allergies  Allergen Reactions  . Lidocaine-Epinephrine     REACTION: pulse rapic  . Penicillins     REACTION: hives  . Rofecoxib     Vioxx, itiching  '   Review of Systems  Constitutional: Negative for chills and fever.  Respiratory: Positive for cough. Negative for shortness of breath and wheezing.   Cardiovascular: Negative for chest pain, palpitations and leg swelling.  Genitourinary: Negative for dysuria.  Neurological: Negative for dizziness.       Objective:   Physical Exam Constitutional:      Appearance: Normal appearance.  Cardiovascular:     Rate and Rhythm: Normal rate and regular rhythm.  Pulmonary:     Effort: Pulmonary effort is normal.     Breath sounds: Normal breath sounds. No wheezing or rales.   Musculoskeletal:     Right lower leg: No edema.     Left lower leg: No edema.  Neurological:     Mental Status: She is alert.        Assessment:     #1 cough.  Past history of reactive airway changes but no respiratory distress and no obvious wheezing on exam at this time.  Suspect exacerbated by allergies  #2 hypertension stable and at goal  #3 chronic osteoarthritis pains improved on tramadol    Plan:     -Flu vaccine given -Prednisone 30 mg daily for 5 days which she has responded well to in the past for her severe resistant allergy symptoms.  We also discussed other potential therapy such as Singulair if symptoms persist/recur.   -She will continue Xyzal and Flonase -Follow-up promptly for any fever or worsening symptoms  Eulas Post MD [facility] at [facility]

## 2019-07-29 ENCOUNTER — Other Ambulatory Visit: Payer: Self-pay | Admitting: Family Medicine

## 2019-08-02 ENCOUNTER — Telehealth: Payer: Self-pay | Admitting: Family Medicine

## 2019-08-02 NOTE — Telephone Encounter (Signed)
Called patient and put her on the schedule for a telephone visit in the morning at 7:30am because this has been going off and on and she requested a z-pack but does not know if this will be enough to help her feel better. FYI

## 2019-08-02 NOTE — Telephone Encounter (Signed)
Pt called in to request something for her cough. Pt says that she was seen a few times for bronchitis.pt says that she would like to have a Z-pak sent in to the pharmacy.     Pharmacy :  [facility], [place] - [place] [phone] (Phone) [phone] (Fax)

## 2019-08-03 ENCOUNTER — Other Ambulatory Visit: Payer: Self-pay

## 2019-08-03 ENCOUNTER — Ambulatory Visit: Payer: Medicare Other | Admitting: Family Medicine

## 2019-08-03 ENCOUNTER — Telehealth (INDEPENDENT_AMBULATORY_CARE_PROVIDER_SITE_OTHER): Payer: Medicare Other | Admitting: Family Medicine

## 2019-08-03 DIAGNOSIS — R05 Cough: Secondary | ICD-10-CM

## 2019-08-03 DIAGNOSIS — R062 Wheezing: Secondary | ICD-10-CM

## 2019-08-03 DIAGNOSIS — R059 Cough, unspecified: Secondary | ICD-10-CM

## 2019-08-03 MED ORDER — PREDNISONE 10 MG PO TABS: ORAL_TABLET | ORAL | 0 refills | Status: DC

## 2019-08-03 MED ORDER — DOXYCYCLINE HYCLATE 100 MG PO CAPS: 100.0000 mg | ORAL_CAPSULE | Freq: Two times a day (BID) | ORAL | 0 refills | Status: DC

## 2019-08-03 NOTE — Telephone Encounter (Signed)
Just got refilled on the 13th of this month.  Should not need refill yet. Not sure why getting request when just filled 5 days ago?

## 2019-08-03 NOTE — Progress Notes (Signed)
This visit type was conducted due to national recommendations for restrictions regarding the COVID-19 pandemic in an effort to limit this patient's exposure and mitigate transmission in our community.   Virtual Visit via Telephone Note  I connected with Molly Patterson on 08/03/19 at  7:30 AM EST by telephone and verified that I am speaking with the correct person using two identifiers.   I discussed the limitations, risks, security and privacy concerns of performing an evaluation and management service by telephone and the availability of in person appointments. I also discussed with the patient that there may be a patient responsible charge related to this service. The patient expressed understanding and agreed to proceed.  Location patient: home Location provider: work or home office Participants present for the call: patient, provider Patient did not have a visit in the prior 7 days to address this/these issue(s).   History of Present Illness: Ms. Molly Patterson relates almost 2 weeks now of cough productive of green sputum along with some mild wheezing.  She does have some mild dyspnea with exertion but not at rest.  No fever.  She been very isolated and no sick contacts.  She had reactive airway issues in the past.  She actually had bronchial type illness in October but after completing 5 days of prednisone and antibiotic then her symptoms actually cleared.  This seems to be a new illness.  She has not had any Covid exposures that she is aware of.  She denies any loss of taste or smell.  No diarrhea.  No body aches.  She has albuterol inhaler but not using.  No chest pain  Past Medical History:  Diagnosis Date  . Allergy    hayfever  . ANEMIA-IRON DEFICIENCY 08/02/2009  . BURSITIS, HIP 08/02/2009  . HYPERTENSION 08/02/2009  . OSTEOARTHRITIS, GENERALIZED 08/12/2010  . Raynaud's syndrome 08/12/2010  . Transfusion history 1964   Past Surgical History:  Procedure Laterality Date  .  APPENDECTOMY    . TONSILLECTOMY      reports that she has never smoked. She has never used smokeless tobacco. She reports that she does not drink alcohol. No history on file for drug. family history is not on file. Allergies  Allergen Reactions  . Lidocaine-Epinephrine     REACTION: pulse rapic  . Penicillins     REACTION: hives  . Rofecoxib     Vioxx, itiching      Observations/Objective: Patient sounds cheerful and well on the phone. I do not appreciate any SOB. Speech and thought processing are grossly intact. Patient reported vitals:  Assessment and Plan: Acute bronchitis.  We offered Covid testing but she declines at this time.  She does not appear to be any respiratory distress.  She is speaking with no difficulty on the phone.  Given her age and productive cough and history of reactive airway issues we recommended going ahead and starting doxycycline 100 mg twice daily for 7 days and prednisone 30 mg daily for 5 days.  Use albuterol inhaler as needed.  Follow-up promptly for any fever, increased shortness of breath, or other concerns  Follow Up Instructions:  -As above   99441 5-10 99442 11-20 99443 21-30 I did not refer this patient for an OV in the next 24 hours for this/these issue(s).  I discussed the assessment and treatment plan with the patient. The patient was provided an opportunity to ask questions and all were answered. The patient agreed with the plan and demonstrated an understanding of the instructions.  The patient was advised to call back or seek an in-person evaluation if the symptoms worsen or if the condition fails to improve as anticipated.  I provided 15 minutes of non-face-to-face time during this encounter.   Carolann Littler, MD

## 2019-09-14 ENCOUNTER — Other Ambulatory Visit: Payer: Self-pay

## 2019-09-14 ENCOUNTER — Telehealth (INDEPENDENT_AMBULATORY_CARE_PROVIDER_SITE_OTHER): Payer: Medicare Other | Admitting: Family Medicine

## 2019-09-14 ENCOUNTER — Encounter: Payer: Self-pay | Admitting: Family Medicine

## 2019-09-14 DIAGNOSIS — R062 Wheezing: Secondary | ICD-10-CM

## 2019-09-14 DIAGNOSIS — R05 Cough: Secondary | ICD-10-CM

## 2019-09-14 DIAGNOSIS — R059 Cough, unspecified: Secondary | ICD-10-CM

## 2019-09-14 MED ORDER — BUDESONIDE-FORMOTEROL FUMARATE 80-4.5 MCG/ACT IN AERO: 2.0000 | INHALATION_SPRAY | Freq: Two times a day (BID) | RESPIRATORY_TRACT | 5 refills | Status: DC

## 2019-09-14 NOTE — Progress Notes (Signed)
This visit type was conducted due to national recommendations for restrictions regarding the COVID-19 pandemic in an effort to limit this patient's exposure and mitigate transmission in our community.   Virtual Visit via Telephone Note  I connected with Molly Patterson on 09/14/19 at  7:00 AM EST by telephone and verified that I am speaking with the correct person using two identifiers.   I discussed the limitations, risks, security and privacy concerns of performing an evaluation and management service by telephone and the availability of in person appointments. I also discussed with the patient that there may be a patient responsible charge related to this service. The patient expressed understanding and agreed to proceed.  Location patient: home Location provider: work or home office Participants present for the call: patient, provider Patient did not have a visit in the prior 7 days to address this/these issue(s).   History of Present Illness: Molly Patterson is a non-smoker who has had frequent intermittent cough really over the past couple years.  She has been on intermittent courses of antibiotics and has also taken intermittent prednisone which seems to help.  She called today because of some cough for the past 2 weeks with intermittent wheezing.  She has felt well otherwise.  She has been very isolated.  Never had a fever.  No myalgias.  She states that generally when she has a cough like this usually starts with some postnasal drip symptoms.  She is currently on Astelin.  She has not had any active GERD symptoms.  Denies any hemoptysis, fever, dyspnea.  She took some Mucinex DM without much relief.  Cough is occasionally productive but clear sputum.  She was recently on course of doxycycline and did improve after that.  She does take Lotensin but states that this is not a chronic dry cough but more of an intermittent productive cough.  She does feel like she has had some mild wheezing each  time.  Not aware of any definite triggers.  She states she had some intermittent wheezing as a child but was never diagnosed with asthma.  Past Medical History:  Diagnosis Date  . Allergy    hayfever  . ANEMIA-IRON DEFICIENCY 08/02/2009  . BURSITIS, HIP 08/02/2009  . HYPERTENSION 08/02/2009  . OSTEOARTHRITIS, GENERALIZED 08/12/2010  . Raynaud's syndrome 08/12/2010  . Transfusion history 1964   Past Surgical History:  Procedure Laterality Date  . APPENDECTOMY    . TONSILLECTOMY      reports that she has never smoked. She has never used smokeless tobacco. She reports that she does not drink alcohol. No history on file for drug. family history is not on file. Allergies  Allergen Reactions  . Lidocaine-Epinephrine     REACTION: pulse rapic  . Penicillins     REACTION: hives  . Rofecoxib     Vioxx, itiching      Observations/Objective: Patient sounds cheerful and well on the phone. I do not appreciate any SOB. Speech and thought processing are grossly intact. Patient reported vitals:  Assessment and Plan:  Intermittent cough and wheezing.  She had multiple exacerbations over the past couple years.  Question postnasal drip related.  No obvious GERD symptoms.  She does take ACE inhibitor but states this cough seems different than a dry daily type cough.  Currently in no distress.  We discussed Covid issues and she has no fever and has been extremely isolated and feels this is more in line with her previous bronchial coughs  -Consider chlorpheniramine 4 mg  nightly for postnasal drip symptoms -Continue Astelin -We discussed possible discontinuation of benazepril and switch to angiotensin receptor blocker at this point she declines -We discussed possible trial of Symbicort 80 mg 2 puffs twice daily -She is reminded to rinse mouth after use.  If her cough is not fully resolved the next couple weeks recommend follow-up for further evaluation -We also discussed Covid testing but she  has been extremely isolated and she declines at this time  Follow Up Instructions:  -As above   99441 5-10 99442 11-20 99443 21-30 I did not refer this patient for an OV in the next 24 hours for this/these issue(s).  I discussed the assessment and treatment plan with the patient. The patient was provided an opportunity to ask questions and all were answered. The patient agreed with the plan and demonstrated an understanding of the instructions.   The patient was advised to call back or seek an in-person evaluation if the symptoms worsen or if the condition fails to improve as anticipated.  I provided 25 minutes of non-face-to-face time during this encounter.   Carolann Littler, MD

## 2019-10-17 ENCOUNTER — Other Ambulatory Visit: Payer: Self-pay | Admitting: Family Medicine

## 2019-12-12 ENCOUNTER — Other Ambulatory Visit: Payer: Self-pay

## 2019-12-12 ENCOUNTER — Ambulatory Visit (INDEPENDENT_AMBULATORY_CARE_PROVIDER_SITE_OTHER): Payer: Medicare Other | Admitting: Family Medicine

## 2019-12-12 ENCOUNTER — Encounter: Payer: Self-pay | Admitting: Family Medicine

## 2019-12-12 VITALS — BP 132/74 | HR 96 | Temp 98.2°F | Wt 131.6 lb

## 2019-12-12 DIAGNOSIS — M5416 Radiculopathy, lumbar region: Secondary | ICD-10-CM | POA: Diagnosis not present

## 2019-12-12 DIAGNOSIS — M25562 Pain in left knee: Secondary | ICD-10-CM | POA: Diagnosis not present

## 2019-12-12 MED ORDER — DICLOFENAC SODIUM 1 % EX GEL: 4.0000 g | Freq: Four times a day (QID) | CUTANEOUS | 1 refills | Status: DC

## 2019-12-12 NOTE — Patient Instructions (Signed)
Lumbosacral Radiculopathy Lumbosacral radiculopathy is a condition that involves the spinal nerves and nerve roots in the low back and bottom of the spine. The condition develops when these nerves and nerve roots move out of place or become inflamed and cause symptoms. What are the causes? This condition may be caused by:  Pressure from a disk that bulges out of place (herniated disk). A disk is a plate of soft cartilage that separates bones in the spine.  Disk changes that occur with age (disk degeneration).  A narrowing of the bones of the lower back (spinal stenosis).  A tumor.  An infection.  An injury that places sudden pressure on the disks that cushion the bones of your lower spine. What increases the risk? You are more likely to develop this condition if:  You are a female who is 30-50 years old.  You are a female who is 50-60 years old.  You use improper technique when lifting things.  You are overweight or live a sedentary lifestyle.  Your work requires frequent lifting.  You smoke.  You do repetitive activities that strain the spine. What are the signs or symptoms? Symptoms of this condition include:  Pain that goes down from your back into your legs (sciatica), usually on one side of the body. This is the most common symptom. The pain may be worse with sitting, coughing, or sneezing.  Pain and numbness in your legs.  Muscle weakness.  Tingling.  Loss of bladder control or bowel control. How is this diagnosed? This condition may be diagnosed based on:  Your symptoms and medical history.  A physical exam. If the pain is lasting, you may have tests, such as:  MRI scan.  X-ray.  CT scan.  A type of X-ray used to examine the spinal canal after injecting a dye into your spine (myelogram).  A test to measure how electrical impulses move through a nerve (nerve conduction study). How is this treated? Treatment may depend on the cause of the condition and  may include:  Working with a physical therapist.  Taking pain medicine.  Applying heat and ice to affected areas.  Doing stretches to improve flexibility.  Doing exercises to strengthen back muscles.  Having chiropractic spinal manipulation.  Using transcutaneous electrical nerve stimulation (TENS) therapy.  Getting a steroid injection in the spine. In some cases, no treatment is needed. If the condition is long-lasting (chronic), or if symptoms are severe, treatment may involve surgery or lifestyle changes, such as following a weight-loss plan. Follow these instructions at home: Activity  Avoid bending and other activities that make the problem worse.  Maintain a proper position when standing or sitting: ? When standing, keep your upper back and neck straight, with your shoulders pulled back. Avoid slouching. ? When sitting, keep your back straight and relax your shoulders. Do not round your shoulders or pull them backward.  Do not sit or stand in one place for long periods of time.  Take brief periods of rest throughout the day. This will reduce your pain. It is usually better to rest by lying down or standing, not sitting.  When you are resting for longer periods, mix in some mild activity or stretching between periods of rest. This will help to prevent stiffness and pain.  Get regular exercise. Ask your health care provider what activities are safe for you. If you were shown how to do any exercises or stretches, do them as directed by your health care provider.  Do   not lift anything that is heavier than 10 lb (4.5 kg) or the limit that you are told by your health care provider. Always use proper lifting technique, which includes: ? Bending your knees. ? Keeping the load close to your body. ? Avoiding twisting. Managing pain  If directed, put ice on the affected area: ? Put ice in a plastic bag. ? Place a towel between your skin and the bag. ? Leave the ice on for 20  minutes, 2-3 times a day.  If directed, apply heat to the affected area as often as told by your health care provider. Use the heat source that your health care provider recommends, such as a moist heat pack or a heating pad. ? Place a towel between your skin and the heat source. ? Leave the heat on for 20-30 minutes. ? Remove the heat if your skin turns bright red. This is especially important if you are unable to feel pain, heat, or cold. You may have a greater risk of getting burned.  Take over-the-counter and prescription medicines only as told by your health care provider. General instructions  Sleep on a firm mattress in a comfortable position. Try lying on your side with your knees slightly bent. If you lie on your back, put a pillow under your knees.  Do not drive or use heavy machinery while taking prescription pain medicine.  If your health care provider prescribed a diet or exercise program, follow it as directed.  Keep all follow-up visits as told by your health care provider. This is important. Contact a health care provider if:  Your pain does not improve over time, even when taking pain medicines. Get help right away if:  You develop severe pain.  Your pain suddenly gets worse.  You develop increasing weakness in your legs.  You lose the ability to control your bladder or bowel.  You have difficulty walking or balancing.  You have a fever. Summary  Lumbosacral radiculopathy is a condition that occurs when the spinal nerves and nerve roots in the lower part of the spine move out of place or become inflamed and cause symptoms.  Symptoms include pain, numbness, and tingling that go down from your back into your legs (sciatica), muscle weakness, and loss of bladder control or bowel control.  If directed, apply ice or heat to the affected area as told by your health care provider.  Follow instructions about activity, rest, and proper lifting technique. This  information is not intended to replace advice given to you by your health care provider. Make sure you discuss any questions you have with your health care provider. Document Revised: 08/20/2017 Document Reviewed: 08/20/2017 Elsevier Patient Education  2020 [employer] Inc.  

## 2019-12-12 NOTE — Progress Notes (Signed)
  Subjective:     Patient ID: Molly Patterson, female   DOB: 12-Feb-1932, 86 y.o.   MRN: [id number]  HPI   Molly Patterson is seen with sharp severe pain that occurred around 5 AM yesterday when she was in bed.  There was no injury. She states that she noted pain from her lateral hip area rating all the way down to the foot that was very sharp.  She had some difficulty ambulating initially but pain gradually improved.  She now has some pain around the left knee today but overall much improved.  She has not noted any numbness.  No loss of urine or stool control.  No low back pain.  She tried some icing with minimal improvement.  Her main complaint at this point is knee pain.  No recent injury.  Past Medical History:  Diagnosis Date  . Allergy    hayfever  . ANEMIA-IRON DEFICIENCY 08/02/2009  . BURSITIS, HIP 08/02/2009  . HYPERTENSION 08/02/2009  . OSTEOARTHRITIS, GENERALIZED 08/12/2010  . Raynaud's syndrome 08/12/2010  . Transfusion history 1964   Past Surgical History:  Procedure Laterality Date  . APPENDECTOMY    . TONSILLECTOMY      reports that she has never smoked. She has never used smokeless tobacco. She reports that she does not drink alcohol. No history on file for drug. family history is not on file. Allergies  Allergen Reactions  . Lidocaine-Epinephrine     REACTION: pulse rapic  . Penicillins     REACTION: hives  . Rofecoxib     Vioxx, itiching     Review of Systems  Constitutional: Negative for chills, fever and unexpected weight change.  Genitourinary: Negative for dysuria.  Musculoskeletal: Negative for back pain.  Skin: Negative for rash.  Neurological: Negative for weakness and numbness.       Objective:   Physical Exam Vitals reviewed.  Constitutional:      Appearance: Normal appearance.  Cardiovascular:     Rate and Rhythm: Normal rate and regular rhythm.  Pulmonary:     Effort: Pulmonary effort is normal.     Breath sounds: Normal breath sounds.   Musculoskeletal:     Right lower leg: No edema.     Left lower leg: No edema.     Comments: Straight leg raise are negative bilaterally.  There is no lower lumbar tenderness.  She has excellent range of motion left hip.  Left knee reveals no effusion.  Good range of motion.  She has some mild medial and lateral joint line tenderness.  Neurological:     Mental Status: She is alert.     Comments: Trace to 1+ reflex ankle and knee bilaterally.  No weakness left lower extremity.  Normal sensory function throughout.        Assessment:     Patient describes acute radiculitis type pain yesterday morning with onset at rest.  Overall symptoms improved but she has mostly some left knee pain at this point.  Not clear those pains are related.  She has nonfocal neuro exam    Plan:     -Continue tramadol as needed -Recommend trial of diclofenac 1% gel to use 3-4 times daily as needed -Follow-up promptly for any worsening pain or other new symptoms  Eulas Post MD [facility]

## 2019-12-23 ENCOUNTER — Telehealth: Payer: Self-pay

## 2019-12-23 NOTE — Telephone Encounter (Signed)
Pa denied for diclofenac

## 2020-01-02 ENCOUNTER — Other Ambulatory Visit: Payer: Self-pay | Admitting: Family Medicine

## 2020-01-02 NOTE — Telephone Encounter (Signed)
Last ov:12/12/19 Last filled:10/17/19

## 2020-01-09 ENCOUNTER — Other Ambulatory Visit: Payer: Self-pay | Admitting: Family Medicine

## 2020-01-30 ENCOUNTER — Telehealth: Payer: Self-pay | Admitting: Family Medicine

## 2020-01-30 DIAGNOSIS — R42 Dizziness and giddiness: Secondary | ICD-10-CM | POA: Diagnosis not present

## 2020-01-30 DIAGNOSIS — H6691 Otitis media, unspecified, right ear: Secondary | ICD-10-CM | POA: Diagnosis not present

## 2020-01-30 NOTE — Telephone Encounter (Signed)
  Pt has vertigo wants to know if the Dr could prescribe medication. she did not want to make an appointment [phone]

## 2020-01-30 NOTE — Telephone Encounter (Signed)
Please advise 

## 2020-01-30 NOTE — Telephone Encounter (Signed)
Tried calling pt back unable to leave voicemail mailbox full

## 2020-01-30 NOTE — Telephone Encounter (Signed)
The problem is that medications are not very effective for vertigo and medications such as meclizine can have some serious side effects such as sedation and increased risk of falls.  Sorting out the cause of vertigo is important in directing therapy   for eg, for BPV, repositioning maneuvers are much more effective than medications.

## 2020-02-07 NOTE — Telephone Encounter (Signed)
Tried calling pt someone picked up the phone but was no one responding for more then a minutes

## 2020-02-17 ENCOUNTER — Ambulatory Visit: Payer: Medicare Other | Admitting: Family Medicine

## 2020-02-22 NOTE — Telephone Encounter (Signed)
Tried calling pt to see if she was still having this issue unable to leave voicemail

## 2020-02-27 ENCOUNTER — Encounter: Payer: Self-pay | Admitting: Family Medicine

## 2020-02-27 ENCOUNTER — Ambulatory Visit (INDEPENDENT_AMBULATORY_CARE_PROVIDER_SITE_OTHER): Payer: Medicare Other | Admitting: Family Medicine

## 2020-02-27 ENCOUNTER — Other Ambulatory Visit: Payer: Self-pay

## 2020-02-27 VITALS — BP 136/76 | HR 100 | Temp 97.6°F | Wt 127.4 lb

## 2020-02-27 DIAGNOSIS — I493 Ventricular premature depolarization: Secondary | ICD-10-CM | POA: Diagnosis not present

## 2020-02-27 DIAGNOSIS — R002 Palpitations: Secondary | ICD-10-CM

## 2020-02-27 MED ORDER — ALBUTEROL SULFATE HFA 108 (90 BASE) MCG/ACT IN AERS: 2.0000 | INHALATION_SPRAY | Freq: Four times a day (QID) | RESPIRATORY_TRACT | 1 refills | Status: DC | PRN

## 2020-02-27 NOTE — Progress Notes (Signed)
Subjective:     Patient ID: Molly Patterson, female   DOB: 02/19/1932, 86 y.o.   MRN: [id number]  HPI Molly Patterson has history of hypertension and osteoarthritis.  She relates that back in May she and her husband had severe GI illness with severe diarrhea which lasted for a few days.  She lost some weight during this time.  She had poor appetite.  She noted onset of some heart "irregularity "during that time.  She had both some rapid pulse but also some irregular pulse.  No history of known atrial fibrillation.  She still has occasional palpitations now.  She was able to eventually keep down some Gatorade.  She was seen in urgent care and for some reason prescribed Cipro and also got some Antivert for vertigo symptoms.  Denies any vertigo at this time.  No dysuria.  No chest pain.  No syncope.  She states her heart rate always tends to be up higher here than she does at home.  She has been doing some gardening and working at home without any exercise intolerance.  No dyspnea with exertion.  Does not use any alcohol.  She is essentially eliminated caffeine.  She is aware that she has had some PVCs in the past  Past Medical History:  Diagnosis Date  . Allergy    hayfever  . ANEMIA-IRON DEFICIENCY 08/02/2009  . BURSITIS, HIP 08/02/2009  . HYPERTENSION 08/02/2009  . OSTEOARTHRITIS, GENERALIZED 08/12/2010  . Raynaud's syndrome 08/12/2010  . Transfusion history 1964   Past Surgical History:  Procedure Laterality Date  . APPENDECTOMY    . TONSILLECTOMY      reports that she has never smoked. She has never used smokeless tobacco. She reports that she does not drink alcohol. No history on file for drug use. family history is not on file. Allergies  Allergen Reactions  . Lidocaine-Epinephrine     REACTION: pulse rapic  . Penicillins     REACTION: hives  . Rofecoxib     Vioxx, itiching     Review of Systems  Constitutional: Negative for appetite change, fever and unexpected weight  change.  Respiratory: Negative for cough and shortness of breath.   Cardiovascular: Positive for palpitations. Negative for chest pain and leg swelling.  Gastrointestinal: Negative for abdominal pain.  Neurological: Negative for syncope.       Objective:   Physical Exam Vitals reviewed.  Constitutional:      Appearance: Normal appearance.  Cardiovascular:     Comments: Heart rate is slightly elevated initially but came down some during exam.  She has only occasional skipped beat.  Otherwise no irregularity. Pulmonary:     Effort: Pulmonary effort is normal.     Breath sounds: Normal breath sounds. No wheezing or rales.  Musculoskeletal:     Cervical back: Neck supple.     Right lower leg: No edema.     Left lower leg: No edema.  Neurological:     Mental Status: She is alert.        Assessment:     Intermittent palpitations.  She was aware of these increasingly after recent GI illness.  Those symptoms is fully resolved.  She has only rare mild sensation of irregularity now.  EKG today shows frequent PVCs.  Ventricular rate around 95.  No acute changes    Plan:     -Continue to avoid caffeine.  She does not drink any alcohol. -We discussed possible use of low-dose beta-blocker which could help blunt PVCs  but at this point she declines. -She does have history of intermittent wheezing and we did refill her albuterol but use sparingly as needed -We also discussed potential event monitor but at this point her symptoms are relatively mild and she would like to observe which seems reasonable  Eulas Post MD [facility]

## 2020-02-27 NOTE — Patient Instructions (Signed)
Premature Ventricular Contraction  A premature ventricular contraction (PVC) is a common kind of irregular heartbeat (arrhythmia). These contractions are extra heartbeats that start in the ventricles of the heart and occur too early in the normal sequence. During the PVC, the heart's normal electrical pathway is not used, so the beat is shorter and less effective. In most cases, these contractions come and go and do not require treatment. What are the causes? Common causes of the condition include:  Smoking.  Drinking alcohol.  Certain medicines.  Some illegal drugs.  Stress.  Caffeine. Certain medical conditions can also cause PVCs:  Heart failure.  Heart attack, or coronary artery disease.  Heart valve problems.  Changes in minerals in the blood (electrolytes).  Low blood oxygen levels or high carbon dioxide levels. In many cases, the cause of this condition is not known. What are the signs or symptoms? The main symptom of this condition is fast or skipped heartbeats (palpitations). Other symptoms include:  Chest pain.  Shortness of breath.  Feeling tired.  Dizziness.  Difficulty exercising. In some cases, there are no symptoms. How is this diagnosed? This condition may be diagnosed based on:  Your medical history.  A physical exam. During the exam, the health care provider will check for irregular heartbeats.  Tests, such as: ? An ECG (electrocardiogram) to monitor the electrical activity of your heart. ? An ambulatory cardiac monitor. This device records your heartbeats for 24 hours or more. ? Stress tests to see how exercise affects your heart rhythm and blood supply. ? An echocardiogram. This test uses sound waves (ultrasound) to produce an image of your heart. ? An electrophysiology study (EPS). This test checks for electrical problems in your heart. How is this treated? Treatment for this condition depends on any underlying conditions, the type of PVCs  that you are having, and how much the symptoms are interfering with your daily life. Possible treatments include:  Avoiding things that cause premature contractions (triggers). These include caffeine and alcohol.  Taking medicines if symptoms are severe or if the extra heartbeats are frequent.  Getting treatment for underlying conditions that cause PVCs.  Having an implantable cardioverter defibrillator (ICD), if you are at risk for a serious arrhythmia. The ICD is a small device that is inserted into your chest to monitor your heartbeat. When it senses an irregular heartbeat, it sends a shock to bring the heartbeat back to normal.  Having a procedure to destroy the portion of the heart tissue that sends out abnormal signals (catheter ablation). In some cases, no treatment is required. Follow these instructions at home: Lifestyle  Do not use any products that contain nicotine or tobacco, such as cigarettes, e-cigarettes, and chewing tobacco. If you need help quitting, ask your health care provider.  Do not use illegal drugs.  Exercise regularly. Ask your health care provider what type of exercise is safe for you.  Try to get at least 7-9 hours of sleep each night, or as much as recommended by your health care provider.  Find healthy ways to manage stress. Avoid stressful situations when possible. Alcohol use  Do not drink alcohol if: ? Your health care provider tells you not to drink. ? You are pregnant, may be pregnant, or are planning to become pregnant. ? Alcohol triggers your episodes.  If you drink alcohol: ? Limit how much you use to:  0-1 drink a day for women.  0-2 drinks a day for men.  Be aware of how much   alcohol is in your drink. In the [place] one drink equals one 12 oz bottle of beer (355 mL), one 5 oz glass of wine (148 mL), or one 1½ oz glass of hard liquor (44 mL). °General instructions °· Take over-the-counter and prescription medicines only as told by your  health care provider. °· If caffeine triggers episodes of PVC, do not eat, drink, or use anything with caffeine in it. °· Keep all follow-up visits as told by your health care provider. This is important. °Contact a health care provider if you: °· Feel palpitations. °Get help right away if you: °· Have chest pain. °· Have shortness of breath. °· Have sweating for no reason. °· Have nausea and vomiting. °· Become light-headed or you faint. °Summary °· A premature ventricular contraction (PVC) is a common kind of irregular heartbeat (arrhythmia). °· In most cases, these contractions come and go and do not require treatment. °· You may need to wear an ambulatory cardiac monitor. This records your heartbeats for 24 hours or more. °· Treatment depends on any underlying conditions, the type of PVCs that you are having, and how much the symptoms are interfering with your daily life. °This information is not intended to replace advice given to you by your health care provider. Make sure you discuss any questions you have with your health care provider. °Document Revised: 05/27/2018 Document Reviewed: 05/27/2018 °Elsevier Patient Education © 2020 [employer] Inc. ° °

## 2020-03-06 ENCOUNTER — Other Ambulatory Visit: Payer: Medicare Other

## 2020-04-02 ENCOUNTER — Other Ambulatory Visit: Payer: Self-pay | Admitting: Family Medicine

## 2020-04-03 NOTE — Telephone Encounter (Signed)
Please advise 

## 2020-04-09 ENCOUNTER — Other Ambulatory Visit: Payer: Self-pay | Admitting: Family Medicine

## 2020-04-27 ENCOUNTER — Ambulatory Visit: Payer: Medicare Other | Admitting: Family Medicine

## 2020-04-30 ENCOUNTER — Ambulatory Visit: Payer: Medicare Other | Admitting: Family Medicine

## 2020-05-14 ENCOUNTER — Other Ambulatory Visit: Payer: Self-pay

## 2020-05-15 ENCOUNTER — Telehealth: Payer: Self-pay | Admitting: Family Medicine

## 2020-05-15 ENCOUNTER — Ambulatory Visit (INDEPENDENT_AMBULATORY_CARE_PROVIDER_SITE_OTHER): Payer: Medicare Other | Admitting: Family Medicine

## 2020-05-15 ENCOUNTER — Encounter: Payer: Self-pay | Admitting: Family Medicine

## 2020-05-15 VITALS — BP 128/70 | HR 58 | Temp 98.0°F | Ht 62.75 in

## 2020-05-15 DIAGNOSIS — R42 Dizziness and giddiness: Secondary | ICD-10-CM | POA: Diagnosis not present

## 2020-05-15 DIAGNOSIS — J0110 Acute frontal sinusitis, unspecified: Secondary | ICD-10-CM

## 2020-05-15 MED ORDER — DOXYCYCLINE HYCLATE 100 MG PO CAPS: 100.0000 mg | ORAL_CAPSULE | Freq: Two times a day (BID) | ORAL | 0 refills | Status: DC

## 2020-05-15 MED ORDER — MECLIZINE HCL 12.5 MG PO TABS: 12.5000 mg | ORAL_TABLET | Freq: Three times a day (TID) | ORAL | 0 refills | Status: DC | PRN

## 2020-05-15 NOTE — Telephone Encounter (Signed)
Pt's son Delilah Mulgrew, not on DPR, wanted to make sure his mother's PCP is aware what is going on with her b/c he feels that she is not being fully truthful. The pt hit her head this past Sunday on the tub/wall b/c she lost her balance due to the vertigo that she has been having for several weeks. She did not go to the ER- she declined. He is concerned that she may not disclose that information.   Aaron Edelman understands that we cannot speak to him due to him not being on the [facility] but wanted to relay this information to her PCP

## 2020-05-15 NOTE — Patient Instructions (Signed)

## 2020-05-15 NOTE — Progress Notes (Signed)
Established Patient Office Visit  Subjective:  Patient ID: Molly Patterson, female    DOB: 07-14-1932  Age: 86 y.o. MRN: [id number]  CC:  Chief Complaint  Patient presents with  . Dizziness  . Ear Pain    HPI Molly Patterson presents for 4-day history of dizziness/vertigo.  She has had history of very similar symptoms in the past.  Her chronic problems include history of hypertension and osteoarthritis.  She is accompanied by her daughter who drove today.  Patient relates last Thursday she noticed some increased sinus congestive symptoms.  She had onset of vertigo then which seem to be bidirectional and has had some mild associated nausea but no vomiting.  No fever.  No focal weakness.  No speech changes.  No swallowing difficulties.  No acute hearing changes.  She has some chronic bilateral hearing loss which is unchanged.  She states 2 days ago she was in the shower and lost her balance and fell but she denies any head injury.  She does relate some bifrontal headaches over the past several days and thinks she has a "sinus infection".  She has had some thick nasal mucus and frequent postnasal drip.  Frontal sinus pressure bilaterally.  Allergy to penicillin.  Past Medical History:  Diagnosis Date  . Allergy    hayfever  . ANEMIA-IRON DEFICIENCY 08/02/2009  . BURSITIS, HIP 08/02/2009  . HYPERTENSION 08/02/2009  . OSTEOARTHRITIS, GENERALIZED 08/12/2010  . Raynaud's syndrome 08/12/2010  . Transfusion history 1964    Past Surgical History:  Procedure Laterality Date  . APPENDECTOMY    . TONSILLECTOMY      Family History  Problem Relation Age of Onset  . Arthritis Neg Hx        family  . Hypertension Neg Hx        family  . Diabetes Neg Hx        family  . Prostate cancer Neg Hx        family    Social History   Socioeconomic History  . Marital status: Married    Spouse name: Not on file  . Number of children: Not on file  . Years of education: Not on file  .  Highest education level: Not on file  Occupational History  . Not on file  Tobacco Use  . Smoking status: Never Smoker  . Smokeless tobacco: Never Used  Vaping Use  . Vaping Use: Never used  Substance and Sexual Activity  . Alcohol use: No  . Drug use: Not on file  . Sexual activity: Not on file  Other Topics Concern  . Not on file  Social History Narrative  . Not on file   Social Determinants of Health   Financial Resource Strain:   . Difficulty of Paying Living Expenses: Not on file  Food Insecurity:   . Worried About Charity fundraiser in the Last Year: Not on file  . Ran Out of Food in the Last Year: Not on file  Transportation Needs:   . Lack of Transportation (Medical): Not on file  . Lack of Transportation (Non-Medical): Not on file  Physical Activity:   . Days of Exercise per Week: Not on file  . Minutes of Exercise per Session: Not on file  Stress:   . Feeling of Stress : Not on file  Social Connections:   . Frequency of Communication with Friends and Family: Not on file  . Frequency of Social Gatherings with Friends and Family:  Not on file  . Attends Religious Services: Not on file  . Active Member of Clubs or Organizations: Not on file  . Attends Archivist Meetings: Not on file  . Marital Status: Not on file  Intimate Partner Violence:   . Fear of Current or Ex-Partner: Not on file  . Emotionally Abused: Not on file  . Physically Abused: Not on file  . Sexually Abused: Not on file    Outpatient Medications Prior to Visit  Medication Sig Dispense Refill  . albuterol (VENTOLIN HFA) 108 (90 Base) MCG/ACT inhaler Inhale 2 puffs into the lungs every 6 (six) hours as needed for wheezing or shortness of breath. 18 g 1  . amLODipine (NORVASC) 10 MG tablet TAKE ONE (1) TABLET EACH DAY 90 tablet 0  . aspirin 81 MG tablet Take 81 mg by mouth daily. Takes 3x per week.    Marland Kitchen azelastine (ASTELIN) 0.1 % nasal spray Place 1 spray into both nostrils 2 (two)  times daily. Use in each nostril as directed 30 mL 12  . benazepril (LOTENSIN) 10 MG tablet TAKE ONE (1) TABLET EACH DAY 90 tablet 0  . Calcium Carb-Cholecalciferol (CALCIUM PLUS VITAMIN D3) 600-500 MG-UNIT CAPS Take by mouth daily. Takes 4x per week.    . diclofenac Sodium (VOLTAREN) 1 % GEL Apply 4 g topically 4 (four) times daily. 100 g 1  . hydrochlorothiazide (MICROZIDE) 12.5 MG capsule TAKE ONE (1) CAPSULE EACH DAY 90 capsule 3  . traMADol (ULTRAM) 50 MG tablet TAKE 1-2 TABLETS EVERY 6 HOURS AS NEEDED FOR PAIN 180 tablet 0   No facility-administered medications prior to visit.    Allergies  Allergen Reactions  . Lidocaine-Epinephrine     REACTION: pulse rapic  . Penicillins     REACTION: hives  . Rofecoxib     Vioxx, itiching    ROS Review of Systems  Constitutional: Negative for chills and fever.  HENT: Positive for congestion and sinus pain. Negative for ear pain.   Respiratory: Negative for cough and shortness of breath.   Cardiovascular: Negative for chest pain.  Neurological: Positive for dizziness. Negative for seizures, syncope, speech difficulty and weakness.      Objective:    Physical Exam Vitals reviewed.  Constitutional:      Appearance: Normal appearance. She is not toxic-appearing or diaphoretic.  HENT:     Head: Normocephalic and atraumatic.     Right Ear: Tympanic membrane and ear canal normal.     Left Ear: Tympanic membrane and ear canal normal.  Eyes:     Pupils: Pupils are equal, round, and reactive to light.  Cardiovascular:     Rate and Rhythm: Regular rhythm.  Pulmonary:     Effort: Pulmonary effort is normal.     Breath sounds: Normal breath sounds.  Musculoskeletal:     Cervical back: Neck supple.  Neurological:     Mental Status: She is alert and oriented to person, place, and time.     Cranial Nerves: No cranial nerve deficit.     Comments: No focal weakness.  Patient has severe vertigo symptoms when going from sitting to supine  with head tilted either to the right or left side. No evidence for ataxia by finger-to-nose testing  Psychiatric:        Mood and Affect: Mood normal.     BP 128/70   Pulse (!) 58   Temp 98 F (36.7 C) (Oral)   Ht 5' 2.75" (1.594 m)   SpO2  92%   BMI 22.75 kg/m  Wt Readings from Last 3 Encounters:  02/27/20 127 lb 6.4 oz (57.8 kg)  12/12/19 131 lb 9.6 oz (59.7 kg)  06/22/19 133 lb 9.6 oz (60.6 kg)     Health Maintenance Due  Topic Date Due  . COVID-19 Vaccine (1) Never done  . DEXA SCAN  Never done  . TETANUS/TDAP  09/15/2018  . INFLUENZA VACCINE  04/15/2020    There are no preventive care reminders to display for this patient.  No results found for: TSH No results found for: WBC, HGB, HCT, MCV, PLT Lab Results  Component Value Date   NA 140 07/27/2017   K 3.7 07/27/2017   CO2 31 07/27/2017   GLUCOSE 103 (H) 07/27/2017   BUN 11 07/27/2017   CREATININE 0.61 07/27/2017   CALCIUM 10.2 07/27/2017   GFR 99.38 07/27/2017   No results found for: CHOL No results found for: HDL No results found for: LDLCALC No results found for: TRIG No results found for: CHOLHDL No results found for: HGBA1C    Assessment & Plan:   Patient presents with 4-day history of acute vertigo.  She has had similar symptoms in the past.  Her symptoms seem to be bidirectional and triggered with head movement.  Is also describing possible sinusitis symptoms mostly frontal sinuses.  She did have recent fall related to her vertigo but denies any head injury.  -We discussed red flags for vertigo such as confusion, recurrent vomiting, vision change, slurred speech, ataxia, focal weakness. -We discussed the limited role of medication like meclizine and the fact that these can cause sedation.  However, patient states that she has had good success with meclizine in the past and this seems to be the thing that has helped resolve her vertigo.  We agreed to limited meclizine 12.5 mg 1 every 6-8 hours as  needed and she is aware this can cause some sedation -We discussed starting doxycycline 100 mg twice daily with food for her sinusitis symptoms -She needs someone with her for additional safety at all times until symptoms improved.  She lives at home with her husband and also has daughter next-door to help -We discussed consideration for CT head without contrast if she is not seeing improvements in the next day or so  Meds ordered this encounter  Medications  . meclizine (ANTIVERT) 12.5 MG tablet    Sig: Take 1 tablet (12.5 mg total) by mouth 3 (three) times daily as needed for dizziness.    Dispense:  30 tablet    Refill:  0  . doxycycline (VIBRAMYCIN) 100 MG capsule    Sig: Take 1 capsule (100 mg total) by mouth 2 (two) times daily.    Dispense:  20 capsule    Refill:  0    Follow-up: No follow-ups on file.    Carolann Littler, MD

## 2020-05-16 NOTE — Telephone Encounter (Signed)
This has been taking care of.

## 2020-05-17 NOTE — Telephone Encounter (Signed)
Pt stated she feels much better with a little dizziness. She was not trying to ignore Dr. Anastasio Auerbach phone call but they have been having phone issues in there area so it has been hard to try to reach out or receive calls. She also stated she is very appreciative of Dr. Elease Hashimoto

## 2020-05-22 NOTE — Telephone Encounter (Signed)
Noted  

## 2020-05-28 ENCOUNTER — Other Ambulatory Visit: Payer: Self-pay

## 2020-05-28 ENCOUNTER — Encounter: Payer: Self-pay | Admitting: Family Medicine

## 2020-05-28 ENCOUNTER — Ambulatory Visit (INDEPENDENT_AMBULATORY_CARE_PROVIDER_SITE_OTHER): Payer: Medicare Other | Admitting: Family Medicine

## 2020-05-28 VITALS — BP 122/74 | HR 95 | Temp 98.2°F | Ht 62.75 in | Wt 131.0 lb

## 2020-05-28 DIAGNOSIS — M150 Primary generalized (osteo)arthritis: Secondary | ICD-10-CM

## 2020-05-28 DIAGNOSIS — M8949 Other hypertrophic osteoarthropathy, multiple sites: Secondary | ICD-10-CM

## 2020-05-28 DIAGNOSIS — R42 Dizziness and giddiness: Secondary | ICD-10-CM

## 2020-05-28 DIAGNOSIS — I10 Essential (primary) hypertension: Secondary | ICD-10-CM

## 2020-05-28 DIAGNOSIS — M159 Polyosteoarthritis, unspecified: Secondary | ICD-10-CM

## 2020-05-28 MED ORDER — BENAZEPRIL HCL 10 MG PO TABS
ORAL_TABLET | ORAL | 3 refills | Status: DC
Start: 2020-05-28 — End: 2021-06-28

## 2020-05-28 MED ORDER — MECLIZINE HCL 12.5 MG PO TABS: 12.5000 mg | ORAL_TABLET | Freq: Three times a day (TID) | ORAL | 0 refills | Status: DC | PRN

## 2020-05-28 MED ORDER — AMLODIPINE BESYLATE 10 MG PO TABS
ORAL_TABLET | ORAL | 3 refills | Status: DC
Start: 2020-05-28 — End: 2021-04-03

## 2020-05-28 NOTE — Progress Notes (Signed)
Established Patient Office Visit  Subjective:  Patient ID: Molly Patterson, female    DOB: Oct 08, 1931  Age: 86 y.o. MRN: [id number]  CC:  Chief Complaint  Patient presents with  . Follow-up    HPI Molly Patterson presents for follow-up from her recent severe vertigo.  Refer to previous note for details.  She states after a couple of days from last visit her symptoms started to improve.  She took the [place] and felt like that helped substantially.  She is able to walk unassisted at this time.  She has actually been to the grocery store and has driven a few times and has had no difficulties.  No nausea or vomiting.  No headache.  Still has occasional sensation of mild instability when turning head rapidly but not consistently.  No focal weakness.  No confusion.  No headache.  She did have some recent sinusitis symptoms which seemed to improved following doxycycline.  She has hypertension treated with amlodipine, Lotensin, and HCTZ.  Requesting refills of blood pressure medications.  Initial blood pressure was up here but repeat after rest was normal  She has osteoarthritis involving multiple joints.  She uses tramadol which seems to help.  She did not get adequate relief with Tylenol.  She knows to avoid regular nonsteroidals given her age.  Past Medical History:  Diagnosis Date  . Allergy    hayfever  . ANEMIA-IRON DEFICIENCY 08/02/2009  . BURSITIS, HIP 08/02/2009  . HYPERTENSION 08/02/2009  . OSTEOARTHRITIS, GENERALIZED 08/12/2010  . Raynaud's syndrome 08/12/2010  . Transfusion history 1964    Past Surgical History:  Procedure Laterality Date  . APPENDECTOMY    . TONSILLECTOMY      Family History  Problem Relation Age of Onset  . Arthritis Neg Hx        family  . Hypertension Neg Hx        family  . Diabetes Neg Hx        family  . Prostate cancer Neg Hx        family    Social History   Socioeconomic History  . Marital status: Married    Spouse name: Not  on file  . Number of children: Not on file  . Years of education: Not on file  . Highest education level: Not on file  Occupational History  . Not on file  Tobacco Use  . Smoking status: Never Smoker  . Smokeless tobacco: Never Used  Vaping Use  . Vaping Use: Never used  Substance and Sexual Activity  . Alcohol use: No  . Drug use: Not on file  . Sexual activity: Not on file  Other Topics Concern  . Not on file  Social History Narrative  . Not on file   Social Determinants of Health   Financial Resource Strain:   . Difficulty of Paying Living Expenses: Not on file  Food Insecurity:   . Worried About Charity fundraiser in the Last Year: Not on file  . Ran Out of Food in the Last Year: Not on file  Transportation Needs:   . Lack of Transportation (Medical): Not on file  . Lack of Transportation (Non-Medical): Not on file  Physical Activity:   . Days of Exercise per Week: Not on file  . Minutes of Exercise per Session: Not on file  Stress:   . Feeling of Stress : Not on file  Social Connections:   . Frequency of Communication with Friends and Family:  Not on file  . Frequency of Social Gatherings with Friends and Family: Not on file  . Attends Religious Services: Not on file  . Active Member of Clubs or Organizations: Not on file  . Attends Archivist Meetings: Not on file  . Marital Status: Not on file  Intimate Partner Violence:   . Fear of Current or Ex-Partner: Not on file  . Emotionally Abused: Not on file  . Physically Abused: Not on file  . Sexually Abused: Not on file    Outpatient Medications Prior to Visit  Medication Sig Dispense Refill  . albuterol (VENTOLIN HFA) 108 (90 Base) MCG/ACT inhaler Inhale 2 puffs into the lungs every 6 (six) hours as needed for wheezing or shortness of breath. 18 g 1  . azelastine (ASTELIN) 0.1 % nasal spray Place 1 spray into both nostrils 2 (two) times daily. Use in each nostril as directed 30 mL 12  . Calcium  Carb-Cholecalciferol (CALCIUM PLUS VITAMIN D3) 600-500 MG-UNIT CAPS Take by mouth daily. Takes 4x per week.    . diclofenac Sodium (VOLTAREN) 1 % GEL Apply 4 g topically 4 (four) times daily. 100 g 1  . doxycycline (VIBRAMYCIN) 100 MG capsule Take 1 capsule (100 mg total) by mouth 2 (two) times daily. 20 capsule 0  . hydrochlorothiazide (MICROZIDE) 12.5 MG capsule TAKE ONE (1) CAPSULE EACH DAY 90 capsule 3  . traMADol (ULTRAM) 50 MG tablet TAKE 1-2 TABLETS EVERY 6 HOURS AS NEEDED FOR PAIN 180 tablet 0  . amLODipine (NORVASC) 10 MG tablet TAKE ONE (1) TABLET EACH DAY 90 tablet 0  . aspirin 81 MG tablet Take 81 mg by mouth daily. Takes 3x per week.    . benazepril (LOTENSIN) 10 MG tablet TAKE ONE (1) TABLET EACH DAY 90 tablet 0  . meclizine (ANTIVERT) 12.5 MG tablet Take 1 tablet (12.5 mg total) by mouth 3 (three) times daily as needed for dizziness. 30 tablet 0   No facility-administered medications prior to visit.    Allergies  Allergen Reactions  . Lidocaine-Epinephrine     REACTION: pulse rapic  . Penicillins     REACTION: hives  . Rofecoxib     Vioxx, itiching    ROS Review of Systems  Constitutional: Negative for chills, fatigue, fever and unexpected weight change.  HENT: Negative for sinus pain.   Eyes: Negative for visual disturbance.  Respiratory: Negative for cough, chest tightness, shortness of breath and wheezing.   Cardiovascular: Negative for chest pain, palpitations and leg swelling.  Endocrine: Negative for polydipsia and polyuria.  Neurological: Negative for dizziness, seizures, syncope, weakness, light-headedness and headaches.      Objective:    Physical Exam Constitutional:      Appearance: She is well-developed.  Eyes:     Pupils: Pupils are equal, round, and reactive to light.  Neck:     Thyroid: No thyromegaly.     Vascular: No JVD.  Cardiovascular:     Rate and Rhythm: Normal rate and regular rhythm.     Heart sounds: No gallop.   Pulmonary:      Effort: Pulmonary effort is normal. No respiratory distress.     Breath sounds: Normal breath sounds. No wheezing or rales.  Musculoskeletal:     Cervical back: Neck supple.  Neurological:     General: No focal deficit present.     Mental Status: She is alert and oriented to person, place, and time.     Cranial Nerves: No cranial nerve deficit.  Motor: No weakness.     Coordination: Coordination normal.     Gait: Gait normal.     BP 122/74 (BP Location: Left Arm, Cuff Size: Normal)   Pulse 95   Temp 98.2 F (36.8 C) (Oral)   Ht 5' 2.75" (1.594 m)   Wt 131 lb (59.4 kg)   SpO2 99%   BMI 23.39 kg/m  Wt Readings from Last 3 Encounters:  05/28/20 131 lb (59.4 kg)  02/27/20 127 lb 6.4 oz (57.8 kg)  12/12/19 131 lb 9.6 oz (59.7 kg)     Health Maintenance Due  Topic Date Due  . COVID-19 Vaccine (1) Never done  . DEXA SCAN  Never done  . TETANUS/TDAP  09/15/2018  . INFLUENZA VACCINE  04/15/2020    There are no preventive care reminders to display for this patient.  No results found for: TSH No results found for: WBC, HGB, HCT, MCV, PLT Lab Results  Component Value Date   NA 140 07/27/2017   K 3.7 07/27/2017   CO2 31 07/27/2017   GLUCOSE 103 (H) 07/27/2017   BUN 11 07/27/2017   CREATININE 0.61 07/27/2017   CALCIUM 10.2 07/27/2017   GFR 99.38 07/27/2017   No results found for: CHOL No results found for: HDL No results found for: LDLCALC No results found for: TRIG No results found for: CHOLHDL No results found for: HGBA1C    Assessment & Plan:   #1 recent vertigo.  Patient had fairly severe symptoms but these have now essentially resolved.  She is requesting 1 refill of Antivert to have on hand for future recurrence.  This was provided.  -We again reviewed signs and symptoms of more worrisome vertigo to be on the look out for  #2 hypertension stable and at goal  -Refill Lotensin and amlodipine for 1 year -She needs follow-up basic metabolic panel.  She  left before this was ordered today.  This will need to be done next visit  She plans to get her flu vaccine in about a month  #3 osteoarthritis involving multiple joints.  She continues to use tramadol intermittently at low dosage which seems to help  Meds ordered this encounter  Medications  . meclizine (ANTIVERT) 12.5 MG tablet    Sig: Take 1 tablet (12.5 mg total) by mouth 3 (three) times daily as needed for dizziness.    Dispense:  30 tablet    Refill:  0  . benazepril (LOTENSIN) 10 MG tablet    Sig: Take one tablet by mouth once daily    Dispense:  90 tablet    Refill:  3  . amLODipine (NORVASC) 10 MG tablet    Sig: Take one tablet by mouth once daily    Dispense:  90 tablet    Refill:  3    Follow-up: No follow-ups on file.    Carolann Littler, MD

## 2020-05-28 NOTE — Patient Instructions (Signed)

## 2020-06-26 ENCOUNTER — Other Ambulatory Visit: Payer: Self-pay | Admitting: Family Medicine

## 2020-07-18 ENCOUNTER — Encounter: Payer: Self-pay | Admitting: Family Medicine

## 2020-07-18 ENCOUNTER — Ambulatory Visit (INDEPENDENT_AMBULATORY_CARE_PROVIDER_SITE_OTHER): Payer: Medicare Other | Admitting: Family Medicine

## 2020-07-18 ENCOUNTER — Other Ambulatory Visit: Payer: Self-pay

## 2020-07-18 VITALS — BP 124/80 | HR 97 | Wt 131.0 lb

## 2020-07-18 DIAGNOSIS — R42 Dizziness and giddiness: Secondary | ICD-10-CM | POA: Diagnosis not present

## 2020-07-18 DIAGNOSIS — R110 Nausea: Secondary | ICD-10-CM | POA: Diagnosis not present

## 2020-07-18 MED ORDER — ONDANSETRON 4 MG PO TBDP: 4.0000 mg | ORAL_TABLET | Freq: Three times a day (TID) | ORAL | 1 refills | Status: DC | PRN

## 2020-07-18 NOTE — Progress Notes (Signed)
Established Patient Office Visit  Subjective:  Patient ID: Molly Patterson, female    DOB: 18-Aug-1932  Age: 86 y.o. MRN: [id number]  CC:  Chief Complaint  Patient presents with  . Hip Pain    hip pain , gait adnormal  dizzness, taking meds not help, nausea, feeling like black out , head feels full     HPI Molly Patterson presents for recurrent vertigo symptoms.  She was seen here on August 31 with vertigo and this seemed to be directional with symptoms worse when turning to the left.  When we saw her back in follow-up on 13 September her vertigo symptoms had almost resolved.  She has had some recurrent symptoms since then.  She has some nonspecific symptoms of stating that her head feels "full ".  She has apparently had some sharp pains involving her left ear greater than right.  Occasional radiation to the occipital region.  Movement seems to clearly trigger her dizziness.  No syncope or presyncope.  She states that when she takes an Antivert and lies in her bed on her right side completely still her symptoms are better but worse when turning to the left.  She has had some intermittent dry heaves.  She has been taking Antivert for the symptoms.  She has chronic hearing loss but no recent acute changes.  Denies any confusion.  No focal weakness.  No swallowing difficulties.  No visual changes.  No speech changes.  No recent head trauma.  Past Medical History:  Diagnosis Date  . Allergy    hayfever  . ANEMIA-IRON DEFICIENCY 08/02/2009  . BURSITIS, HIP 08/02/2009  . HYPERTENSION 08/02/2009  . OSTEOARTHRITIS, GENERALIZED 08/12/2010  . Raynaud's syndrome 08/12/2010  . Transfusion history 1964    Past Surgical History:  Procedure Laterality Date  . APPENDECTOMY    . TONSILLECTOMY      Family History  Problem Relation Age of Onset  . Arthritis Neg Hx        family  . Hypertension Neg Hx        family  . Diabetes Neg Hx        family  . Prostate cancer Neg Hx         family    Social History   Socioeconomic History  . Marital status: Married    Spouse name: Not on file  . Number of children: Not on file  . Years of education: Not on file  . Highest education level: Not on file  Occupational History  . Not on file  Tobacco Use  . Smoking status: Never Smoker  . Smokeless tobacco: Never Used  Vaping Use  . Vaping Use: Never used  Substance and Sexual Activity  . Alcohol use: No  . Drug use: Not on file  . Sexual activity: Not on file  Other Topics Concern  . Not on file  Social History Narrative  . Not on file   Social Determinants of Health   Financial Resource Strain:   . Difficulty of Paying Living Expenses: Not on file  Food Insecurity:   . Worried About Charity fundraiser in the Last Year: Not on file  . Ran Out of Food in the Last Year: Not on file  Transportation Needs:   . Lack of Transportation (Medical): Not on file  . Lack of Transportation (Non-Medical): Not on file  Physical Activity:   . Days of Exercise per Week: Not on file  . Minutes of Exercise  per Session: Not on file  Stress:   . Feeling of Stress : Not on file  Social Connections:   . Frequency of Communication with Friends and Family: Not on file  . Frequency of Social Gatherings with Friends and Family: Not on file  . Attends Religious Services: Not on file  . Active Member of Clubs or Organizations: Not on file  . Attends Archivist Meetings: Not on file  . Marital Status: Not on file  Intimate Partner Violence:   . Fear of Current or Ex-Partner: Not on file  . Emotionally Abused: Not on file  . Physically Abused: Not on file  . Sexually Abused: Not on file    Outpatient Medications Prior to Visit  Medication Sig Dispense Refill  . albuterol (VENTOLIN HFA) 108 (90 Base) MCG/ACT inhaler Inhale 2 puffs into the lungs every 6 (six) hours as needed for wheezing or shortness of breath. 18 g 1  . amLODipine (NORVASC) 10 MG tablet Take one  tablet by mouth once daily 90 tablet 3  . azelastine (ASTELIN) 0.1 % nasal spray Place 1 spray into both nostrils 2 (two) times daily. Use in each nostril as directed 30 mL 12  . benazepril (LOTENSIN) 10 MG tablet Take one tablet by mouth once daily 90 tablet 3  . Calcium Carb-Cholecalciferol (CALCIUM PLUS VITAMIN D3) 600-500 MG-UNIT CAPS Take by mouth daily. Takes 4x per week.    . diclofenac Sodium (VOLTAREN) 1 % GEL Apply 4 g topically 4 (four) times daily. 100 g 1  . doxycycline (VIBRAMYCIN) 100 MG capsule Take 1 capsule (100 mg total) by mouth 2 (two) times daily. 20 capsule 0  . hydrochlorothiazide (MICROZIDE) 12.5 MG capsule TAKE ONE (1) CAPSULE EACH DAY 90 capsule 3  . traMADol (ULTRAM) 50 MG tablet TAKE 1-2 TABLETS EVERY 6 HOURS AS NEEDED FOR PAIN 180 tablet 0  . meclizine (ANTIVERT) 12.5 MG tablet Take 1 tablet (12.5 mg total) by mouth 3 (three) times daily as needed for dizziness. 30 tablet 0   No facility-administered medications prior to visit.    Allergies  Allergen Reactions  . Lidocaine-Epinephrine     REACTION: pulse rapic  . Penicillins     REACTION: hives  . Rofecoxib     Vioxx, itiching    ROS Review of Systems  Constitutional: Negative for chills and fever.  Respiratory: Negative for cough.   Cardiovascular: Negative for chest pain.  Neurological: Positive for dizziness and headaches. Negative for tremors, seizures, syncope, facial asymmetry and speech difficulty.  Psychiatric/Behavioral: Negative for confusion.      Objective:    Physical Exam Vitals reviewed.  Constitutional:      Appearance: Normal appearance.  HENT:     Right Ear: Tympanic membrane and ear canal normal.     Left Ear: Tympanic membrane and ear canal normal.  Cardiovascular:     Rate and Rhythm: Normal rate and regular rhythm.  Pulmonary:     Effort: Pulmonary effort is normal.     Breath sounds: Normal breath sounds.  Musculoskeletal:     Right lower leg: No edema.     Left  lower leg: No edema.  Neurological:     General: No focal deficit present.     Mental Status: She is alert.     Cranial Nerves: No cranial nerve deficit.     Comments: 5+ grip strength bilaterally.  No focal weakness.  Cerebellar function normal by finger-to-nose testing     BP 124/80 (BP  Location: Left Arm, Patient Position: Sitting, Cuff Size: Normal)   Pulse 97   Wt 131 lb (59.4 kg)   SpO2 97%   BMI 23.39 kg/m  Wt Readings from Last 3 Encounters:  07/18/20 131 lb (59.4 kg)  05/28/20 131 lb (59.4 kg)  02/27/20 127 lb 6.4 oz (57.8 kg)     Health Maintenance Due  Topic Date Due  . COVID-19 Vaccine (1) Never done  . DEXA SCAN  Never done  . TETANUS/TDAP  09/15/2018  . INFLUENZA VACCINE  04/15/2020    There are no preventive care reminders to display for this patient.  No results found for: TSH No results found for: WBC, HGB, HCT, MCV, PLT Lab Results  Component Value Date   NA 140 07/27/2017   K 3.7 07/27/2017   CO2 31 07/27/2017   GLUCOSE 103 (H) 07/27/2017   BUN 11 07/27/2017   CREATININE 0.61 07/27/2017   CALCIUM 10.2 07/27/2017   GFR 99.38 07/27/2017   No results found for: CHOL No results found for: HDL No results found for: LDLCALC No results found for: TRIG No results found for: CHOLHDL No results found for: HGBA1C    Assessment & Plan:  Patient presents with several week history of recurrent vertigo symptoms.  This is accompanied by nausea and occasional vomiting.  She has had some very nonspecific symptoms of intermittent fleeting pain in her left ear region and radiating occipitally.  She has had poor appetite.  She has nonfocal neuro exam at this time.  -Given duration of symptoms recommend further evaluation.  Will check labs with CBC, comprehensive metabolic panel, TSH -Set up CT head without contrast.  May need MRI to further assess. -Wrote for Zofran 4 mg ODT every 6 hours as needed for nausea and vomiting. -Family knows to take her to ER for  further evaluation if symptoms become more urgent   Meds ordered this encounter  Medications  . ondansetron (ZOFRAN ODT) 4 MG disintegrating tablet    Sig: Take 1 tablet (4 mg total) by mouth every 8 (eight) hours as needed for nausea or vomiting.    Dispense:  20 tablet    Refill:  1    Follow-up: No follow-ups on file.    Carolann Littler, MD

## 2020-07-19 ENCOUNTER — Ambulatory Visit (INDEPENDENT_AMBULATORY_CARE_PROVIDER_SITE_OTHER)
Admission: RE | Admit: 2020-07-19 | Discharge: 2020-07-19 | Disposition: A | Discharge status: Home / Self Care | Payer: Medicare Other | Source: Ambulatory Visit | Attending: Family Medicine | Admitting: Family Medicine

## 2020-07-19 DIAGNOSIS — R42 Dizziness and giddiness: Secondary | ICD-10-CM

## 2020-07-19 LAB — CBC WITH DIFFERENTIAL/PLATELET
Absolute Monocytes: 521 cells/uL (ref 200–950)
Basophils Absolute: 81 cells/uL (ref 0–200)
Basophils Relative: 1.3 %
Eosinophils Absolute: 68 cells/uL (ref 15–500)
Eosinophils Relative: 1.1 %
HCT: 44.4 % (ref 35.0–45.0)
Hemoglobin: 14.8 g/dL (ref 11.7–15.5)
Lymphs Abs: 1004 cells/uL (ref 850–3900)
MCH: 29.9 pg (ref 27.0–33.0)
MCHC: 33.3 g/dL (ref 32.0–36.0)
MCV: 89.7 fL (ref 80.0–100.0)
MPV: 11.4 fL (ref 7.5–12.5)
Monocytes Relative: 8.4 %
Neutro Abs: 4526 cells/uL (ref 1500–7800)
Neutrophils Relative %: 73 %
Platelets: 249 10*3/uL (ref 140–400)
RBC: 4.95 10*6/uL (ref 3.80–5.10)
RDW: 13.8 % (ref 11.0–15.0)
Total Lymphocyte: 16.2 %
WBC: 6.2 10*3/uL (ref 3.8–10.8)

## 2020-07-19 LAB — COMPREHENSIVE METABOLIC PANEL
AG Ratio: 1.8 (calc) (ref 1.0–2.5)
ALT: 10 U/L (ref 6–29)
AST: 14 U/L (ref 10–35)
Albumin: 4.4 g/dL (ref 3.6–5.1)
Alkaline phosphatase (APISO): 68 U/L (ref 37–153)
BUN: 13 mg/dL (ref 7–25)
CO2: 30 mmol/L (ref 20–32)
Calcium: 10.3 mg/dL (ref 8.6–10.4)
Chloride: 99 mmol/L (ref 98–110)
Creat: 0.73 mg/dL (ref 0.60–0.88)
Globulin: 2.5 g/dL (calc) (ref 1.9–3.7)
Glucose, Bld: 100 mg/dL — ABNORMAL HIGH (ref 65–99)
Potassium: 4 mmol/L (ref 3.5–5.3)
Sodium: 141 mmol/L (ref 135–146)
Total Bilirubin: 0.6 mg/dL (ref 0.2–1.2)
Total Protein: 6.9 g/dL (ref 6.1–8.1)

## 2020-07-19 LAB — TSH: TSH: 1.29 mIU/L (ref 0.40–4.50)

## 2020-07-23 ENCOUNTER — Telehealth: Payer: Self-pay | Admitting: Family Medicine

## 2020-07-23 MED ORDER — MECLIZINE HCL 25 MG PO TABS: 25.0000 mg | ORAL_TABLET | Freq: Three times a day (TID) | ORAL | 0 refills | Status: DC | PRN

## 2020-07-23 NOTE — Telephone Encounter (Signed)
Pt is calling in stating that she is much much better and is no longer dizzy.  Pt would like to have a copy of the vertigo exercise and would like to have the Rx Antivert (meclizine) 25 MG called in.  Pharm:  [facility]

## 2020-07-23 NOTE — Telephone Encounter (Signed)
Forwarding

## 2020-07-23 NOTE — Telephone Encounter (Signed)
Pt wanted know about vertigo excise,please advise.

## 2020-07-24 NOTE — Telephone Encounter (Signed)
Called and left detail voicemail for patient , informing her that the vertigo exercise were mail out to her .

## 2020-07-24 NOTE — Telephone Encounter (Signed)
I printed these out.

## 2020-08-01 ENCOUNTER — Other Ambulatory Visit: Payer: Self-pay | Admitting: Family Medicine

## 2020-08-24 ENCOUNTER — Other Ambulatory Visit: Payer: Self-pay

## 2020-08-24 ENCOUNTER — Encounter: Payer: Self-pay | Admitting: Family Medicine

## 2020-08-24 ENCOUNTER — Ambulatory Visit (INDEPENDENT_AMBULATORY_CARE_PROVIDER_SITE_OTHER): Payer: Medicare Other | Admitting: Family Medicine

## 2020-08-24 VITALS — BP 134/66 | HR 95 | Temp 97.9°F | Ht 62.75 in | Wt 127.0 lb

## 2020-08-24 DIAGNOSIS — M7062 Trochanteric bursitis, left hip: Secondary | ICD-10-CM

## 2020-08-24 DIAGNOSIS — Z23 Encounter for immunization: Secondary | ICD-10-CM | POA: Diagnosis not present

## 2020-08-24 NOTE — Progress Notes (Signed)
Established Patient Office Visit  Subjective:  Patient ID: Molly Patterson, female    DOB: 1932-02-10  Age: 86 y.o. MRN: [id number]  CC: No chief complaint on file.   HPI Molly Patterson presents for left lateral hip pain for several months.  She had recent flare of vertigo which lasted for many days and she states she was less active than usual.  She has been using some type of over-the-counter patch presumably with lidocaine which is provided only temporary relief.  She has not gotten any relief with heat or ice.  Her pain is confined to the lateral region.  No anterior or medial hip pain.  No recent injury.  No visible bruising.  She had injection years ago for similar pain which worked very well.  Pain is starting to wake her more at night.  She also needs flu vaccine today.  Past Medical History:  Diagnosis Date  . Allergy    hayfever  . ANEMIA-IRON DEFICIENCY 08/02/2009  . BURSITIS, HIP 08/02/2009  . HYPERTENSION 08/02/2009  . OSTEOARTHRITIS, GENERALIZED 08/12/2010  . Raynaud's syndrome 08/12/2010  . Transfusion history 1964    Past Surgical History:  Procedure Laterality Date  . APPENDECTOMY    . TONSILLECTOMY      Family History  Problem Relation Age of Onset  . Arthritis Neg Hx        family  . Hypertension Neg Hx        family  . Diabetes Neg Hx        family  . Prostate cancer Neg Hx        family    Social History   Socioeconomic History  . Marital status: Married    Spouse name: Not on file  . Number of children: Not on file  . Years of education: Not on file  . Highest education level: Not on file  Occupational History  . Not on file  Tobacco Use  . Smoking status: Never Smoker  . Smokeless tobacco: Never Used  Vaping Use  . Vaping Use: Never used  Substance and Sexual Activity  . Alcohol use: No  . Drug use: Not on file  . Sexual activity: Not on file  Other Topics Concern  . Not on file  Social History Narrative  . Not on file    Social Determinants of Health   Financial Resource Strain: Not on file  Food Insecurity: Not on file  Transportation Needs: Not on file  Physical Activity: Not on file  Stress: Not on file  Social Connections: Not on file  Intimate Partner Violence: Not on file    Outpatient Medications Prior to Visit  Medication Sig Dispense Refill  . albuterol (VENTOLIN HFA) 108 (90 Base) MCG/ACT inhaler Inhale 2 puffs into the lungs every 6 (six) hours as needed for wheezing or shortness of breath. 18 g 1  . amLODipine (NORVASC) 10 MG tablet Take one tablet by mouth once daily 90 tablet 3  . azelastine (ASTELIN) 0.1 % nasal spray Place 1 spray into both nostrils 2 (two) times daily. Use in each nostril as directed 30 mL 12  . benazepril (LOTENSIN) 10 MG tablet Take one tablet by mouth once daily 90 tablet 3  . Calcium Carb-Cholecalciferol 600-500 MG-UNIT CAPS Take by mouth daily. Takes 4x per week.    . diclofenac Sodium (VOLTAREN) 1 % GEL Apply 4 g topically 4 (four) times daily. 100 g 1  . doxycycline (VIBRAMYCIN) 100 MG capsule Take 1  capsule (100 mg total) by mouth 2 (two) times daily. 20 capsule 0  . hydrochlorothiazide (MICROZIDE) 12.5 MG capsule TAKE ONE (1) CAPSULE EACH DAY 90 capsule 3  . meclizine (ANTIVERT) 25 MG tablet Take 1 tablet (25 mg total) by mouth 3 (three) times daily as needed for dizziness. 30 tablet 0  . ondansetron (ZOFRAN-ODT) 4 MG disintegrating tablet TAKE 1 TABLET EVERY 8 HOURS AS NEEDED FOR NAUSEA AND VOMITING 20 tablet 1  . traMADol (ULTRAM) 50 MG tablet TAKE 1-2 TABLETS EVERY 6 HOURS AS NEEDED FOR PAIN 180 tablet 0   No facility-administered medications prior to visit.    Allergies  Allergen Reactions  . Lidocaine-Epinephrine     REACTION: pulse rapic  . Penicillins     REACTION: hives  . Rofecoxib     Vioxx, itiching    ROS Review of Systems  Constitutional: Negative for chills and fever.  Musculoskeletal: Negative for back pain.  Neurological:  Negative for weakness and numbness.      Objective:    Physical Exam Vitals reviewed.  Constitutional:      Appearance: Normal appearance.  Cardiovascular:     Rate and Rhythm: Normal rate and regular rhythm.  Pulmonary:     Effort: Pulmonary effort is normal.     Breath sounds: Normal breath sounds.  Musculoskeletal:     Comments: Left hip reveals excellent range of motion.  She has some tenderness laterally over the greater trochanteric bursa.  Neurological:     Mental Status: She is alert.     Comments: Full strength lower extremities     BP 134/66 (BP Location: Left Arm, Patient Position: Sitting, Cuff Size: Normal)   Pulse 95   Temp 97.9 F (36.6 C) (Oral)   Ht 5' 2.75" (1.594 m)   Wt 127 lb (57.6 kg)   SpO2 97%   BMI 22.68 kg/m  Wt Readings from Last 3 Encounters:  08/24/20 127 lb (57.6 kg)  07/18/20 131 lb (59.4 kg)  05/28/20 131 lb (59.4 kg)     Health Maintenance Due  Topic Date Due  . COVID-19 Vaccine (1) Never done  . DEXA SCAN  Never done  . TETANUS/TDAP  09/15/2018  . INFLUENZA VACCINE  04/15/2020    There are no preventive care reminders to display for this patient.  Lab Results  Component Value Date   TSH 1.29 07/18/2020   Lab Results  Component Value Date   WBC 6.2 07/18/2020   HGB 14.8 07/18/2020   HCT 44.4 07/18/2020   MCV 89.7 07/18/2020   PLT 249 07/18/2020   Lab Results  Component Value Date   NA 141 07/18/2020   K 4.0 07/18/2020   CO2 30 07/18/2020   GLUCOSE 100 (H) 07/18/2020   BUN 13 07/18/2020   CREATININE 0.73 07/18/2020   BILITOT 0.6 07/18/2020   AST 14 07/18/2020   ALT 10 07/18/2020   PROT 6.9 07/18/2020   CALCIUM 10.3 07/18/2020   GFR 99.38 07/27/2017   No results found for: CHOL No results found for: HDL No results found for: LDLCALC No results found for: TRIG No results found for: CHOLHDL No results found for: HGBA1C    Assessment & Plan:   Left greater trochanteric hip bursitis -Patient has not  gotten any relief with over-the-counter patches nor any heat or ice.  Symptoms have been progressive -We discussed risk of steroid injection including risk of bruising, bleeding, low risk of infection and patient consents. -We prepped the skin with Betadine.  Injected 1 cc of Depo-Medrol 80 mg and 2 cc of plain Xylocaine using 25-gauge 1 inch needle and patient tolerated well   No orders of the defined types were placed in this encounter.   Follow-up: No follow-ups on file.    Carolann Littler, MD

## 2020-08-24 NOTE — Addendum Note (Signed)
Addended by: Karren Cobble on: 08/24/2020 01:32 PM   Modules accepted: Orders

## 2020-08-24 NOTE — Patient Instructions (Signed)
Hip Bursitis  Hip bursitis is inflammation of a fluid-filled sac (bursa) in the hip joint. The bursa prevents the bones in the hip joint from rubbing against each other. Hip bursitis can cause mild to moderate pain, and symptoms often come and go over time. What are the causes? This condition may be caused by:  Injury to the hip.  Overuse of the muscles that surround the hip joint.  Previous injury or surgery of the hip.  Arthritis or gout.  Diabetes.  Thyroid disease.  Infection. In some cases, the cause may not be known. What are the signs or symptoms? Symptoms of this condition include:  Mild or moderate pain in the hip area. Pain may get worse with movement.  Tenderness and swelling of the hip, especially on the outer side of the hip.  In rare cases, the bursa may become infected. This may cause a fever, as well as warmth and redness in the area. Symptoms may come and go. How is this diagnosed? This condition may be diagnosed based on:  A physical exam.  Your medical history.  X-rays.  Removal of fluid from your inflamed bursa for testing (biopsy). You may be sent to a health care provider who specializes in bone diseases (orthopedist) or a provider who specializes in joint inflammation (rheumatologist). How is this treated? This condition is treated by resting, icing, applying pressure (compression), and raising (elevating) the injured area. This is called RICE treatment. In some cases, this may be enough to make your symptoms go away. Treatment may also include:  Using crutches.  Draining fluid out of the bursa to help relieve swelling.  Injecting medicine that helps to reduce inflammation (cortisone).  Additional medicines if the bursa is infected. Follow these instructions at home: Managing pain, stiffness, and swelling   If directed, put ice on the painful area. ? Put ice in a plastic bag. ? Place a towel between your skin and the bag. ? Leave the ice  on for 20 minutes, 2-3 times a day. ? Raise (elevate) your hip above the level of your heart as much as you can without pain. To do this, try putting a pillow under your hips while you lie down. Activity  Return to your normal activities as told by your health care provider. Ask your health care provider what activities are safe for you.  Rest and protect your hip as much as possible until your pain and swelling get better. General instructions  Take over-the-counter and prescription medicines only as told by your health care provider.  Wear compression wraps only as told by your health care provider.  Do not use your hip to support your body weight until your health care provider says that you can. Use crutches as told by your health care provider.  Gently massage and stretch your injured area as often as is comfortable.  Keep all follow-up visits as told by your health care provider. This is important. How is this prevented?  Exercise regularly, as told by your health care provider.  Warm up and stretch before being active.  Cool down and stretch after being active.  If an activity irritates your hip or causes pain, avoid the activity as much as possible.  Avoid sitting down for long periods at a time. Contact a health care provider if you:  Have a fever.  Develop new symptoms.  Have difficulty walking or doing everyday activities.  Have pain that gets worse or does not get better with medicine.  Develop red skin or a feeling of warmth in your hip area. Get help right away if you:  Cannot move your hip.  Have severe pain. Summary  Hip bursitis is inflammation of a fluid-filled sac (bursa) in the hip joint.  Hip bursitis can cause mild to moderate pain, and symptoms often come and go over time.  This condition is treated with rest, ice, compression, elevation, and medicines. This information is not intended to replace advice given to you by your health care  provider. Make sure you discuss any questions you have with your health care provider. Document Revised: 05/10/2018 Document Reviewed: 05/10/2018 Elsevier Patient Education  [place].

## 2020-08-31 ENCOUNTER — Other Ambulatory Visit: Payer: Self-pay | Admitting: Family Medicine

## 2020-09-03 NOTE — Telephone Encounter (Signed)
May refill once. 

## 2020-09-04 ENCOUNTER — Other Ambulatory Visit: Payer: Self-pay | Admitting: Family Medicine

## 2020-10-15 ENCOUNTER — Other Ambulatory Visit: Payer: Self-pay | Admitting: Family Medicine

## 2021-01-14 ENCOUNTER — Encounter: Payer: Self-pay | Admitting: Family Medicine

## 2021-01-14 ENCOUNTER — Ambulatory Visit (INDEPENDENT_AMBULATORY_CARE_PROVIDER_SITE_OTHER): Payer: Medicare Other | Admitting: Family Medicine

## 2021-01-14 ENCOUNTER — Other Ambulatory Visit: Payer: Self-pay

## 2021-01-14 VITALS — BP 130/70 | HR 121 | Temp 98.0°F | Wt 122.2 lb

## 2021-01-14 DIAGNOSIS — I10 Essential (primary) hypertension: Secondary | ICD-10-CM

## 2021-01-14 DIAGNOSIS — R5383 Other fatigue: Secondary | ICD-10-CM | POA: Diagnosis not present

## 2021-01-14 DIAGNOSIS — R000 Tachycardia, unspecified: Secondary | ICD-10-CM | POA: Diagnosis not present

## 2021-01-14 DIAGNOSIS — R634 Abnormal weight loss: Secondary | ICD-10-CM

## 2021-01-14 LAB — POCT URINALYSIS DIPSTICK
Bilirubin, UA: NEGATIVE
Blood, UA: NEGATIVE
Glucose, UA: NEGATIVE
Ketones, UA: NEGATIVE
Leukocytes, UA: NEGATIVE
Nitrite, UA: NEGATIVE
Protein, UA: NEGATIVE
Spec Grav, UA: <=1.005 — AB (ref 1.010–1.025)
Urobilinogen, UA: 0.2 E.U./dL
pH, UA: 7.5 (ref 5.0–8.0)

## 2021-01-14 LAB — CBC WITH DIFFERENTIAL/PLATELET
Basophils Absolute: 0.1 10*3/uL (ref 0.0–0.1)
Basophils Relative: 0.8 % (ref 0.0–3.0)
Eosinophils Absolute: 0 10*3/uL (ref 0.0–0.7)
Eosinophils Relative: 0.5 % (ref 0.0–5.0)
HCT: 42 % (ref 36.0–46.0)
Hemoglobin: 14.2 g/dL (ref 12.0–15.0)
Lymphocytes Relative: 11.7 % — ABNORMAL LOW (ref 12.0–46.0)
Lymphs Abs: 1.1 10*3/uL (ref 0.7–4.0)
MCHC: 33.8 g/dL (ref 30.0–36.0)
MCV: 89.4 fl (ref 78.0–100.0)
Monocytes Absolute: 0.6 10*3/uL (ref 0.1–1.0)
Monocytes Relative: 6 % (ref 3.0–12.0)
Neutro Abs: 7.9 10*3/uL — ABNORMAL HIGH (ref 1.4–7.7)
Neutrophils Relative %: 81 % — ABNORMAL HIGH (ref 43.0–77.0)
Platelets: 278 10*3/uL (ref 150.0–400.0)
RBC: 4.7 Mil/uL (ref 3.87–5.11)
RDW: 15.1 % (ref 11.5–15.5)
WBC: 9.7 10*3/uL (ref 4.0–10.5)

## 2021-01-14 LAB — COMPREHENSIVE METABOLIC PANEL
ALT: 10 U/L (ref 0–35)
AST: 13 U/L (ref 0–37)
Albumin: 4.4 g/dL (ref 3.5–5.2)
Alkaline Phosphatase: 77 U/L (ref 39–117)
BUN: 11 mg/dL (ref 6–23)
CO2: 29 mEq/L (ref 19–32)
Calcium: 10.1 mg/dL (ref 8.4–10.5)
Chloride: 100 mEq/L (ref 96–112)
Creatinine, Ser: 0.68 mg/dL (ref 0.40–1.20)
GFR: 78.44 mL/min (ref >60.00)
Glucose, Bld: 127 mg/dL — ABNORMAL HIGH (ref 70–99)
Potassium: 4 mEq/L (ref 3.5–5.1)
Sodium: 141 mEq/L (ref 135–145)
Total Bilirubin: 0.7 mg/dL (ref 0.2–1.2)
Total Protein: 7 g/dL (ref 6.0–8.3)

## 2021-01-14 LAB — SEDIMENTATION RATE: Sed Rate: 31 mm/hr — ABNORMAL HIGH (ref 0–30)

## 2021-01-14 LAB — TSH: TSH: 1.37 u[IU]/mL (ref 0.35–4.50)

## 2021-01-14 NOTE — Progress Notes (Signed)
Established Patient Office Visit  Subjective:  Patient ID: Molly Patterson, female    DOB: [birth year]  Age: 87 y.o. MRN: [id number]  CC:  Chief Complaint  Patient presents with  . Fatigue    Feels weak, very fatigued, no appetite.     HPI Molly Patterson presents for nonspecific complaints of feeling very weak and fatigued especially over the past week.  She states she has had loss of appetite.  She has lost about 5 pounds.  She has a longstanding history of intermittent vertigo.  She had some symptoms last week and started taking over-the-counter Dramamine.  She just stopped the Dramamine yesterday and she does wonder if this (medication) may have some effect on her symptoms.  She has not had any recent headaches, abdominal pain, chest pains, dyspnea, dysuria, or any stool changes.  She is having some generalized arthralgias.  She states she has had difficulty even feeling like getting out of bed.  She did have multiple labs back in the fall including CBC, chemistries, TSH and these were normal at that time  She has hypertension treated with a 3 drug regimen of amlodipine, benazepril, and HCTZ.  Past Medical History:  Diagnosis Date  . Allergy    hayfever  . ANEMIA-IRON DEFICIENCY 08/02/2009  . BURSITIS, HIP 08/02/2009  . HYPERTENSION 08/02/2009  . OSTEOARTHRITIS, GENERALIZED 08/12/2010  . Raynaud's syndrome 08/12/2010  . Transfusion history 1964    Past Surgical History:  Procedure Laterality Date  . APPENDECTOMY    . TONSILLECTOMY      Family History  Problem Relation Age of Onset  . Arthritis Neg Hx        family  . Hypertension Neg Hx        family  . Diabetes Neg Hx        family  . Prostate cancer Neg Hx        family    Social History   Socioeconomic History  . Marital status: Married    Spouse name: Not on file  . Number of children: Not on file  . Years of education: Not on file  . Highest education level: Not on file  Occupational History  .  Not on file  Tobacco Use  . Smoking status: Never Smoker  . Smokeless tobacco: Never Used  Vaping Use  . Vaping Use: Never used  Substance and Sexual Activity  . Alcohol use: No  . Drug use: Not on file  . Sexual activity: Not on file  Other Topics Concern  . Not on file  Social History Narrative  . Not on file   Social Determinants of Health   Financial Resource Strain: Not on file  Food Insecurity: Not on file  Transportation Needs: Not on file  Physical Activity: Not on file  Stress: Not on file  Social Connections: Not on file  Intimate Partner Violence: Not on file    Outpatient Medications Prior to Visit  Medication Sig Dispense Refill  . albuterol (VENTOLIN HFA) 108 (90 Base) MCG/ACT inhaler Inhale 2 puffs into the lungs every 6 (six) hours as needed for wheezing or shortness of breath. 18 g 1  . amLODipine (NORVASC) 10 MG tablet Take one tablet by mouth once daily 90 tablet 3  . azelastine (ASTELIN) 0.1 % nasal spray Place 1 spray into both nostrils 2 (two) times daily. Use in each nostril as directed 30 mL 12  . benazepril (LOTENSIN) 10 MG tablet Take one tablet by mouth  once daily 90 tablet 3  . Calcium Carb-Cholecalciferol 600-500 MG-UNIT CAPS Take by mouth daily. Takes 4x per week.    . diclofenac Sodium (VOLTAREN) 1 % GEL Apply 4 g topically 4 (four) times daily. 100 g 1  . hydrochlorothiazide (MICROZIDE) 12.5 MG capsule TAKE ONE (1) CAPSULE EACH DAY 90 capsule 3  . ondansetron (ZOFRAN-ODT) 4 MG disintegrating tablet TAKE 1 TABLET EVERY 8 HOURS AS NEEDED FOR NAUSEA AND VOMITING 20 tablet 1  . traMADol (ULTRAM) 50 MG tablet TAKE 1-2 TABLETS EVERY 6 HOURS AS NEEDED FOR PAIN 180 tablet 1  . doxycycline (VIBRAMYCIN) 100 MG capsule Take 1 capsule (100 mg total) by mouth 2 (two) times daily. 20 capsule 0  . Meclizine HCl 25 MG CHEW TAKE 1 TABLET 3 TIMES DAILY AS NEEDED FOR DIZZINESS 30 tablet 0   No facility-administered medications prior to visit.    Allergies   Allergen Reactions  . Lidocaine-Epinephrine     REACTION: pulse rapic  . Penicillins     REACTION: hives  . Rofecoxib     Vioxx, itiching    ROS Review of Systems  Constitutional: Positive for appetite change, fatigue and unexpected weight change. Negative for chills, diaphoresis and fever.  HENT: Negative for trouble swallowing.   Respiratory: Negative for cough and shortness of breath.   Cardiovascular: Negative for chest pain, palpitations and leg swelling.  Gastrointestinal: Negative for abdominal pain, diarrhea and vomiting.  Neurological: Negative for seizures, syncope and headaches.  Hematological: Negative for adenopathy.  Psychiatric/Behavioral: Negative for confusion.      Objective:    Physical Exam Vitals reviewed.  HENT:     Mouth/Throat:     Comments: Oropharynx is clear.  Mucous membranes do appear slightly dry Cardiovascular:     Rate and Rhythm: Regular rhythm.     Comments: She is tachycardic rhythm with initial rate of 120 but this did decrease to around 95 after rest.  Rhythm is regular Pulmonary:     Effort: Pulmonary effort is normal.     Breath sounds: Normal breath sounds.  Abdominal:     Palpations: Abdomen is soft. There is no mass.     Tenderness: There is no abdominal tenderness.  Musculoskeletal:     Cervical back: Neck supple.     Right lower leg: No edema.     Left lower leg: No edema.  Lymphadenopathy:     Cervical: No cervical adenopathy.  Neurological:     General: No focal deficit present.     Mental Status: She is alert.     Cranial Nerves: No cranial nerve deficit.     BP 130/70 (BP Location: Left Arm, Patient Position: Sitting, Cuff Size: Normal)   Pulse (!) 121   Temp 98 F (36.7 C) (Oral)   Wt 122 lb 3.2 oz (55.4 kg)   SpO2 97%   BMI 21.82 kg/m  Wt Readings from Last 3 Encounters:  01/14/21 122 lb 3.2 oz (55.4 kg)  08/24/20 127 lb (57.6 kg)  07/18/20 131 lb (59.4 kg)     Health Maintenance Due  Topic Date Due   . COVID-19 Vaccine (1) Never done  . DEXA SCAN  Never done  . TETANUS/TDAP  09/15/2018    There are no preventive care reminders to display for this patient.  Lab Results  Component Value Date   TSH 1.29 07/18/2020   Lab Results  Component Value Date   WBC 6.2 07/18/2020   HGB 14.8 07/18/2020   HCT 44.4  07/18/2020   MCV 89.7 07/18/2020   PLT 249 07/18/2020   Lab Results  Component Value Date   NA 141 07/18/2020   K 4.0 07/18/2020   CO2 30 07/18/2020   GLUCOSE 100 (H) 07/18/2020   BUN 13 07/18/2020   CREATININE 0.73 07/18/2020   BILITOT 0.6 07/18/2020   AST 14 07/18/2020   ALT 10 07/18/2020   PROT 6.9 07/18/2020   CALCIUM 10.3 07/18/2020   GFR 99.38 07/27/2017   No results found for: CHOL No results found for: HDL No results found for: LDLCALC No results found for: TRIG No results found for: CHOLHDL No results found for: HGBA1C    Assessment & Plan:   Problem List Items Addressed This Visit      Unprioritized   Essential hypertension    Other Visit Diagnoses    Fatigue, unspecified type    -  Primary   Relevant Orders   TSH   CBC with Differential/Platelet   POCT urinalysis dipstick   Tachycardia       Relevant Orders   EKG 12-Lead (Completed)   TSH   CBC with Differential/Platelet   Unintentional weight loss       Relevant Orders   TSH   CBC with Differential/Platelet   CMP   Sedimentation rate   POCT urinalysis dipstick    Patient presents with least 1 week history of extreme fatigue and loss of appetite.  She has longstanding history of vertigo and about a week ago started taking Dramamine fairly regularly.  She did stop this yesterday.  Difficult to know how much of this is medication related versus other.  She does have increased heart rate but regular and we obtained EKG which shows sinus rhythm with occasional PVC.  No A. Fib.  Recommended the following:  -Leave off Dramamine -Increase hydration -Check further labs as above -Follow-up  immediately for any fever or other change in symptoms.  No orders of the defined types were placed in this encounter.   Follow-up: No follow-ups on file.    Carolann Littler, MD

## 2021-01-14 NOTE — Patient Instructions (Signed)
Leave off the Dramamine.    Stay well hydrated.     Try some bland and easy to digest foods- eg toast, rice, apple sauce, etc.   OK to try some supplements such as Boost or Ensure if you can tolerate.    We will call you with the lab results.

## 2021-04-02 ENCOUNTER — Other Ambulatory Visit: Payer: Self-pay | Admitting: Family Medicine

## 2021-04-02 NOTE — Telephone Encounter (Signed)
Last filled 09/04/2020 Last OV 01/14/2021  Ok to fill?

## 2021-04-03 ENCOUNTER — Other Ambulatory Visit: Payer: Self-pay | Admitting: Family Medicine

## 2021-05-24 ENCOUNTER — Other Ambulatory Visit: Payer: Self-pay | Admitting: Family Medicine

## 2021-05-24 NOTE — Telephone Encounter (Signed)
Last filled 04/02/2021 Last OV 01/14/2021  Ok to fill?

## 2021-06-12 IMAGING — CT CT HEAD W/O CM
3 of 4 series · 15 of 47 positions shown, 18 images · non-contrast
Comparison: None.

CLINICAL DATA: Dizziness and nausea.

EXAM:
CT HEAD WITHOUT CONTRAST
TECHNIQUE: Contiguous axial images were obtained from the base of the skull
through the vertex without intravenous contrast.

[Series 2: head 5.0 hr68 · axial · 0.45mm/px · z∈[+75,+205]mm · 9 of 32 slices shown, 12 images]
[im 3/32  brain]
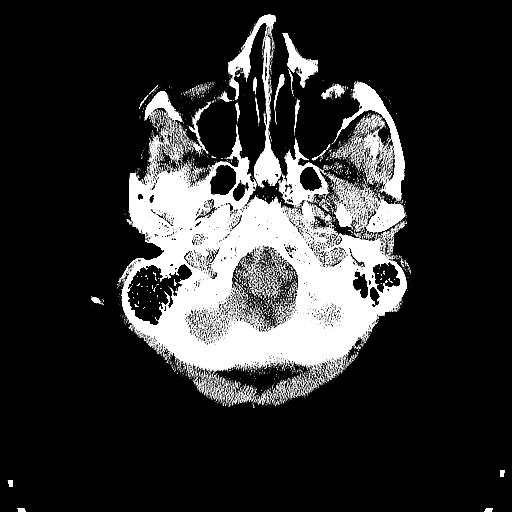
[im 3/32  bone]
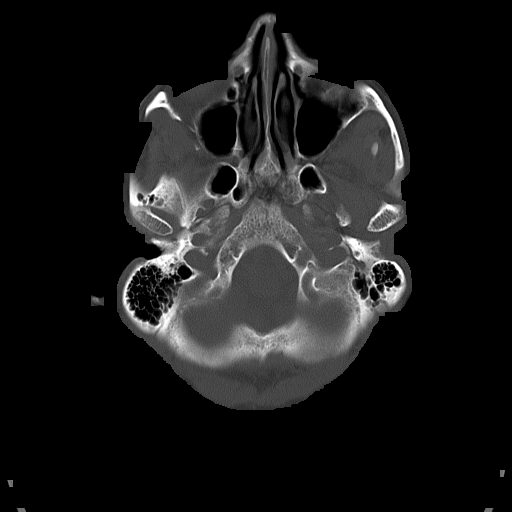
[im 7/32  brain]
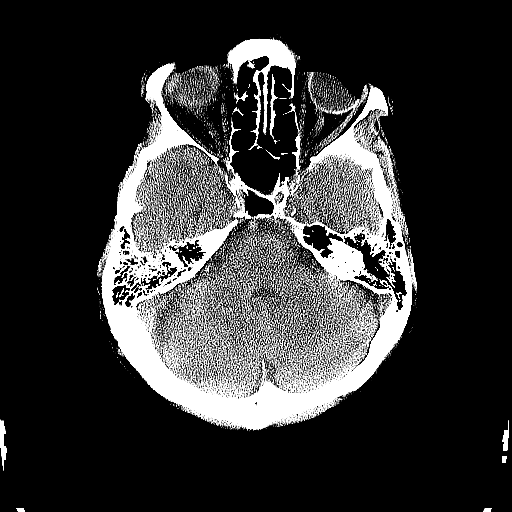
[im 9/32  brain]
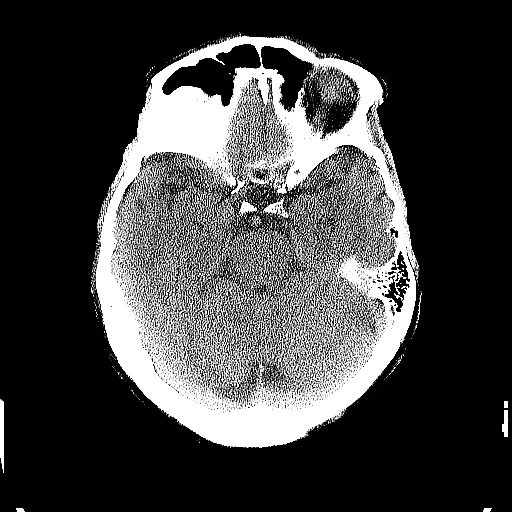
[im 14/32  brain]
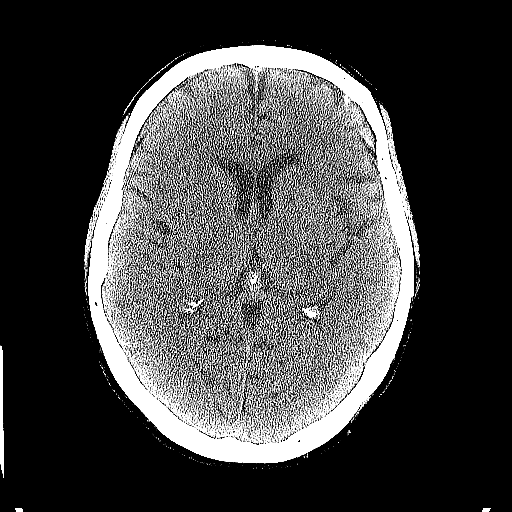
[im 16/32  brain]
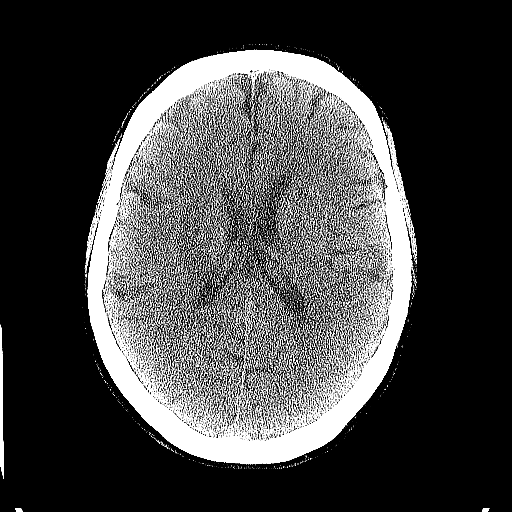
[im 16/32  bone]
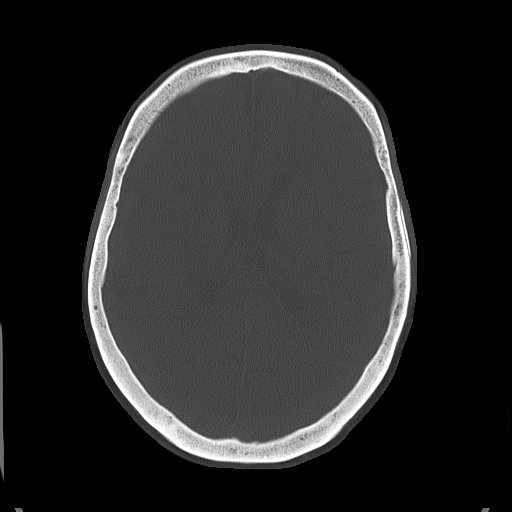
[im 18/32  brain]
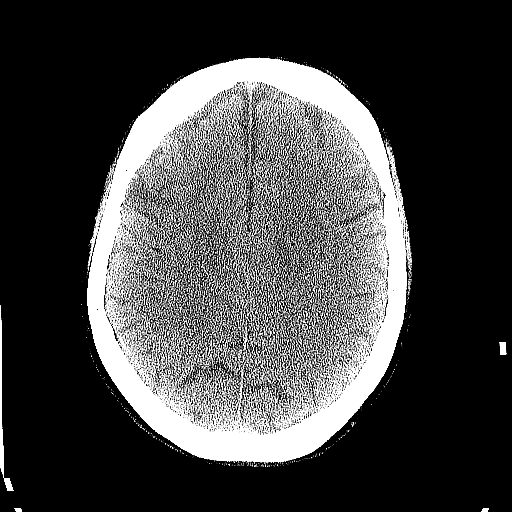
[im 23/32  brain]
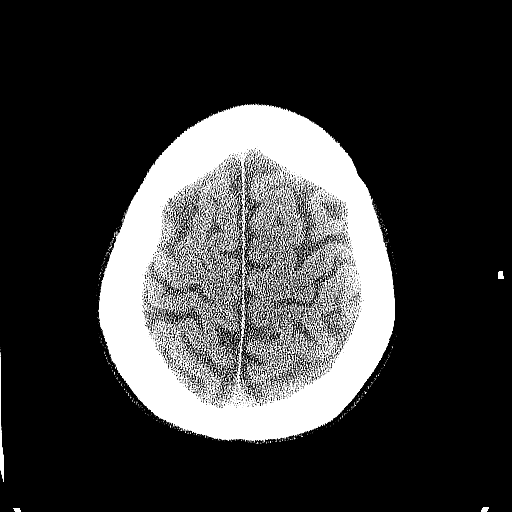
[im 25/32  brain]
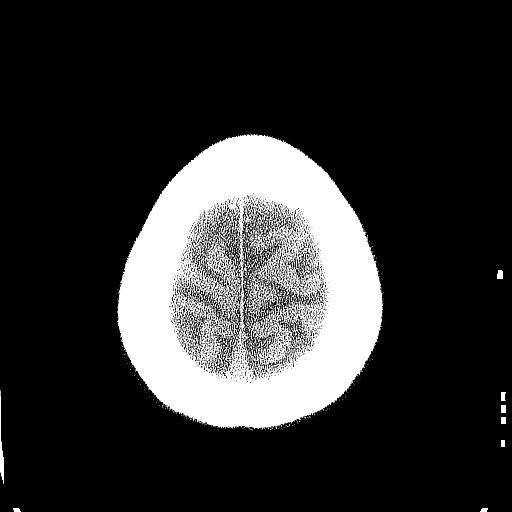
[im 29/32  brain]
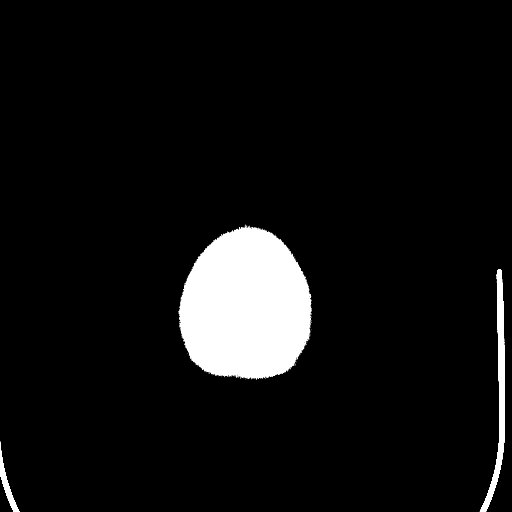
[im 29/32  bone]
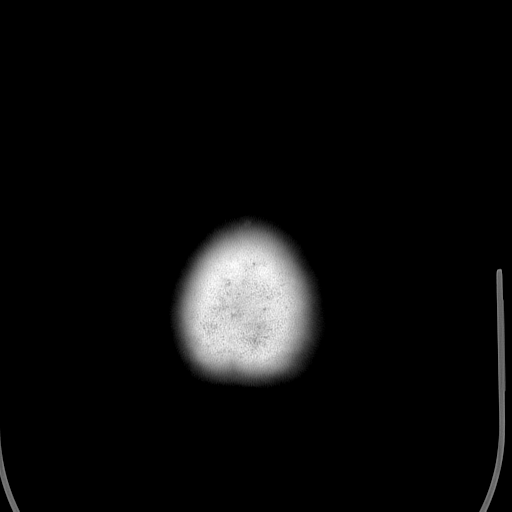

[Series 4: head 3.0 mpr cor · coronal · 0.31mm/px · 3 of 62 slices shown]
[im 21/62  brain]
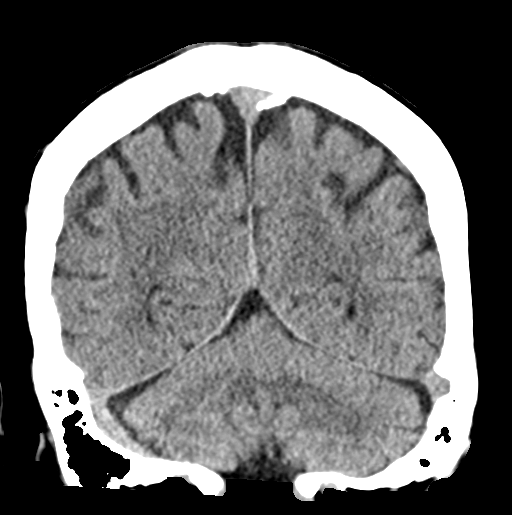
[im 28/62  brain]
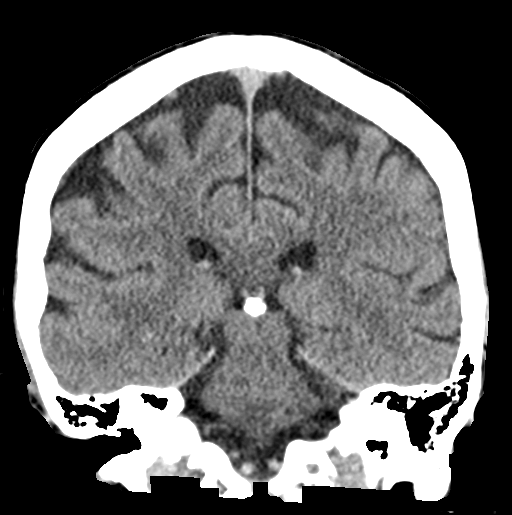
[im 34/62  brain]
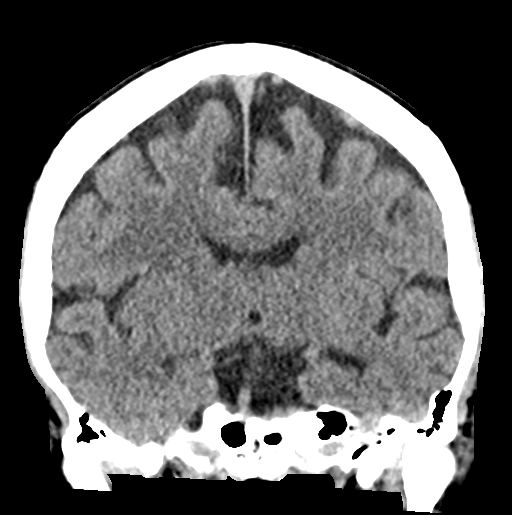

[Series 5: head 3.0 mpr sag · sagittal · 0.32mm/px · 3 of 52 slices shown]
[im 18/52  brain]
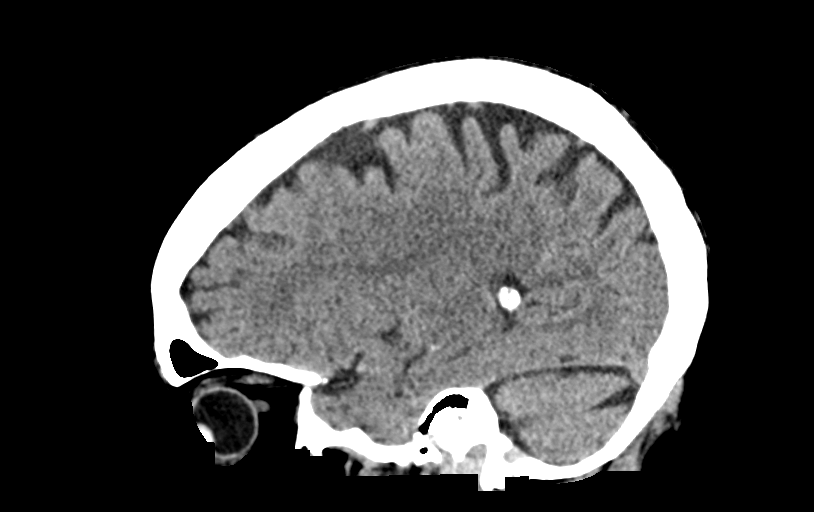
[im 26/52  brain]
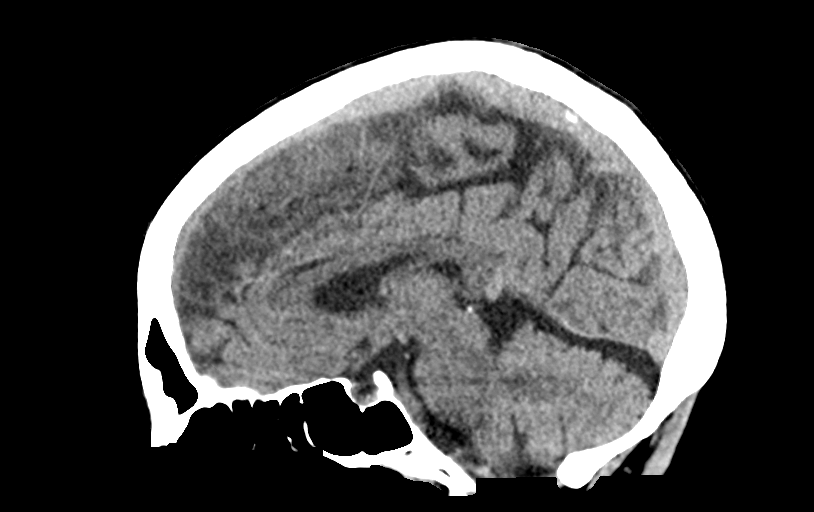
[im 35/52  brain]
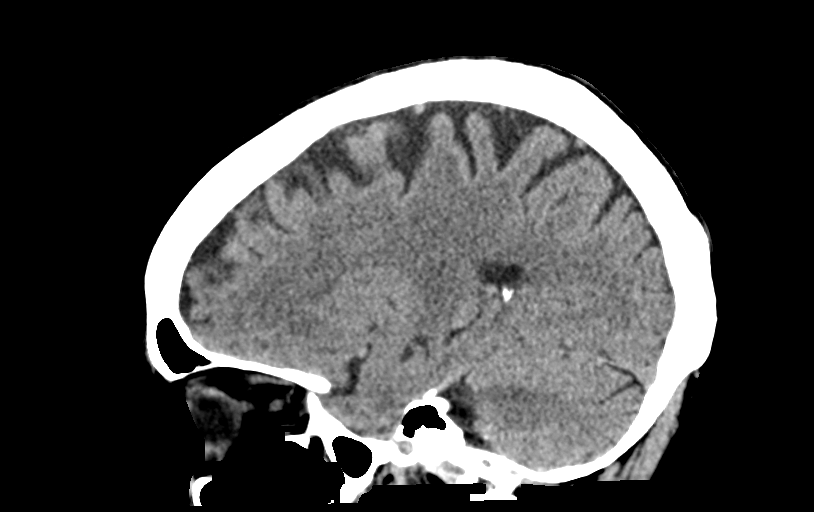

[15 of 47 positions shown; findings below may reference images not displayed]

FINDINGS: Brain: No evidence of acute infarction, hemorrhage, hydrocephalus,
extra-axial collection or mass lesion/mass effect. Mild scattered
hypodensity within the white matter, most likely chronic
microvascular ischemic disease. Focal hypodensity in the inferior
right basal ganglia most likely represents a dilated perivascular
space given the location (less likely a remote lacunar infarct).

Vascular: Calcific atherosclerosis.

Skull: No acute fracture.

Sinuses/Orbits: Small amount of secretions in the left frontal
sinus. Otherwise, the sinuses are clear. Unremarkable orbits.

Other: No mastoid effusions.
IMPRESSION: No evidence of acute intracranial abnormality.

## 2021-06-27 DIAGNOSIS — H903 Sensorineural hearing loss, bilateral: Secondary | ICD-10-CM | POA: Diagnosis not present

## 2021-06-28 ENCOUNTER — Other Ambulatory Visit: Payer: Self-pay | Admitting: Family Medicine

## 2021-07-25 DIAGNOSIS — H40033 Anatomical narrow angle, bilateral: Secondary | ICD-10-CM | POA: Diagnosis not present

## 2021-07-25 DIAGNOSIS — H2513 Age-related nuclear cataract, bilateral: Secondary | ICD-10-CM | POA: Diagnosis not present

## 2021-07-26 ENCOUNTER — Other Ambulatory Visit: Payer: Self-pay | Admitting: Family Medicine

## 2021-07-26 NOTE — Telephone Encounter (Signed)
Last filled 05/24/2021 Last OV 01/14/2021  Ok to fill?

## 2021-08-02 ENCOUNTER — Ambulatory Visit (INDEPENDENT_AMBULATORY_CARE_PROVIDER_SITE_OTHER): Payer: Medicare Other | Admitting: Family Medicine

## 2021-08-02 VITALS — BP 140/60 | HR 100 | Temp 98.0°F | Wt 127.7 lb

## 2021-08-02 DIAGNOSIS — Z23 Encounter for immunization: Secondary | ICD-10-CM

## 2021-08-02 DIAGNOSIS — I10 Essential (primary) hypertension: Secondary | ICD-10-CM | POA: Diagnosis not present

## 2021-08-02 DIAGNOSIS — M159 Polyosteoarthritis, unspecified: Secondary | ICD-10-CM | POA: Diagnosis not present

## 2021-08-02 DIAGNOSIS — M25512 Pain in left shoulder: Secondary | ICD-10-CM

## 2021-08-02 NOTE — Progress Notes (Signed)
Established Patient Office Visit  Subjective:  Patient ID: Molly Patterson, female    DOB: 1932/04/20  Age: 87 y.o. MRN: [id number]  CC:  Chief Complaint  Patient presents with   Follow-up    HPI Molly Patterson presents for medical follow-up.  She still needs flu vaccine.  Almost 71-month history of left shoulder pain.  For started after she was harvesting some giant sunflower plants.  She recalls reaching up but does not recall any injury or any falls.  Pain is somewhat triceps region.  Pain especially with internal rotation.  Some pain with abduction.  Some night pain.  No definite weakness.  No significant neck pain  Osteoarthritis involving multiple joints especially knees, ankles, shoulders.  She takes tramadol total of 3 daily which seems to help.  She has gotten inadequate relief with Tylenol.  Hypertension treated with HCTZ, benazepril, and amlodipine.  Renal function back in May was stable.  Denies any recent falls.  No chest pains.  No dyspnea.  She has decided not to get any further COVID vaccines  Past Medical History:  Diagnosis Date   Allergy    hayfever   ANEMIA-IRON DEFICIENCY 08/02/2009   BURSITIS, HIP 08/02/2009   HYPERTENSION 08/02/2009   OSTEOARTHRITIS, GENERALIZED 08/12/2010   Raynaud's syndrome 08/12/2010   Transfusion history 1964    Past Surgical History:  Procedure Laterality Date   APPENDECTOMY     TONSILLECTOMY      Family History  Problem Relation Age of Onset   Arthritis Neg Hx        family   Hypertension Neg Hx        family   Diabetes Neg Hx        family   Prostate cancer Neg Hx        family    Social History   Socioeconomic History   Marital status: Married    Spouse name: Not on file   Number of children: Not on file   Years of education: Not on file   Highest education level: Not on file  Occupational History   Not on file  Tobacco Use   Smoking status: Never   Smokeless tobacco: Never  Vaping Use   Vaping Use:  Never used  Substance and Sexual Activity   Alcohol use: No   Drug use: Not on file   Sexual activity: Not on file  Other Topics Concern   Not on file  Social History Narrative   Not on file   Social Determinants of Health   Financial Resource Strain: Not on file  Food Insecurity: Not on file  Transportation Needs: Not on file  Physical Activity: Not on file  Stress: Not on file  Social Connections: Not on file  Intimate Partner Violence: Not on file    Outpatient Medications Prior to Visit  Medication Sig Dispense Refill   albuterol (VENTOLIN HFA) 108 (90 Base) MCG/ACT inhaler Inhale 2 puffs into the lungs every 6 (six) hours as needed for wheezing or shortness of breath. 18 g 1   amLODipine (NORVASC) 10 MG tablet TAKE ONE (1) TABLET EACH DAY 90 tablet 3   azelastine (ASTELIN) 0.1 % nasal spray Place 1 spray into both nostrils 2 (two) times daily. Use in each nostril as directed 30 mL 12   benazepril (LOTENSIN) 10 MG tablet TAKE ONE (1) TABLET EACH DAY 90 tablet 0   Calcium Carb-Cholecalciferol 600-500 MG-UNIT CAPS Take by mouth daily. Takes 4x per week.  diclofenac Sodium (VOLTAREN) 1 % GEL Apply 4 g topically 4 (four) times daily. 100 g 1   hydrochlorothiazide (MICROZIDE) 12.5 MG capsule TAKE ONE (1) CAPSULE EACH DAY 90 capsule 3   ondansetron (ZOFRAN-ODT) 4 MG disintegrating tablet TAKE 1 TABLET EVERY 8 HOURS AS NEEDED FOR NAUSEA AND VOMITING 20 tablet 1   traMADol (ULTRAM) 50 MG tablet TAKE 1-2 TABLETS EVERY 6 HOURS AS NEEDED FOR PAIN 180 tablet 0   No facility-administered medications prior to visit.    Allergies  Allergen Reactions   Lidocaine-Epinephrine     REACTION: pulse rapic   Penicillins     REACTION: hives   Rofecoxib     Vioxx, itiching    ROS Review of Systems  Constitutional:  Negative for chills, fatigue and fever.  Eyes:  Negative for visual disturbance.  Respiratory:  Negative for cough, chest tightness, shortness of breath and wheezing.    Cardiovascular:  Negative for chest pain, palpitations and leg swelling.  Genitourinary:  Negative for dysuria.  Musculoskeletal:  Positive for arthralgias.  Neurological:  Negative for dizziness, seizures, syncope, weakness, light-headedness and headaches.  Psychiatric/Behavioral:  Negative for confusion.      Objective:    Physical Exam Constitutional:      Appearance: She is well-developed.  Eyes:     Pupils: Pupils are equal, round, and reactive to light.  Neck:     Thyroid: No thyromegaly.     Vascular: No JVD.  Cardiovascular:     Rate and Rhythm: Normal rate and regular rhythm.     Heart sounds:    No gallop.  Pulmonary:     Effort: Pulmonary effort is normal. No respiratory distress.     Breath sounds: Normal breath sounds. No wheezing or rales.  Musculoskeletal:     Cervical back: Neck supple.     Right lower leg: No edema.     Left lower leg: No edema.     Comments: Left shoulder reveals decent range of motion.  No biceps tenderness.  No acromioclavicular tenderness.  Does have pain with internal rotation.  Minimal pain with abduction against resistance  Neurological:     Mental Status: She is alert.    BP 140/60 (BP Location: Left Arm, Patient Position: Sitting, Cuff Size: Normal)   Pulse 100   Temp 98 F (36.7 C) (Oral)   Wt 127 lb 11.2 oz (57.9 kg)   SpO2 98%   BMI 22.80 kg/m  Wt Readings from Last 3 Encounters:  08/02/21 127 lb 11.2 oz (57.9 kg)  01/14/21 122 lb 3.2 oz (55.4 kg)  08/24/20 127 lb (57.6 kg)     Health Maintenance Due  Topic Date Due   COVID-19 Vaccine (1) Never done   Zoster Vaccines- Shingrix (1 of 2) Never done   DEXA SCAN  Never done   TETANUS/TDAP  09/15/2018    There are no preventive care reminders to display for this patient.  Lab Results  Component Value Date   TSH 1.37 01/14/2021   Lab Results  Component Value Date   WBC 9.7 01/14/2021   HGB 14.2 01/14/2021   HCT 42.0 01/14/2021   MCV 89.4 01/14/2021   PLT  278.0 01/14/2021   Lab Results  Component Value Date   NA 141 01/14/2021   K 4.0 01/14/2021   CO2 29 01/14/2021   GLUCOSE 127 (H) 01/14/2021   BUN 11 01/14/2021   CREATININE 0.68 01/14/2021   BILITOT 0.7 01/14/2021   ALKPHOS 77 01/14/2021  AST 13 01/14/2021   ALT 10 01/14/2021   PROT 7.0 01/14/2021   ALBUMIN 4.4 01/14/2021   CALCIUM 10.1 01/14/2021   GFR 78.44 01/14/2021   No results found for: CHOL No results found for: HDL No results found for: LDLCALC No results found for: TRIG No results found for: CHOLHDL No results found for: HGBA1C    Assessment & Plan:   #1 hypertension.  Stable.  Continue current medications as above.  Recheck basic metabolic panel at 73-month follow-up.  #2 osteoarthritis involving multiple sites.  Continue low-dose tramadol.  She generally takes 1 in the morning and 2 at night and this regimen seems to be working well for her and has so for years.  No history of misuse.  #3 suspect probably has some element of rotator cuff tendinitis.  Doubt tear.  Discussed options including corticosteroid injection and at this point she wishes to wait.  She worked in Programme researcher, broadcasting/film/video for years and knows the importance of maintaining range of motion to avoid adhesive capsulitis.  Flu vaccine given  No orders of the defined types were placed in this encounter.   Follow-up: Return in about 6 months (around 01/30/2022).    Carolann Littler, MD

## 2021-08-12 ENCOUNTER — Other Ambulatory Visit: Payer: Self-pay | Admitting: Family Medicine

## 2021-08-20 ENCOUNTER — Other Ambulatory Visit: Payer: Self-pay | Admitting: Family Medicine

## 2021-08-23 NOTE — Telephone Encounter (Signed)
ATC but could not reach patient.

## 2021-08-26 NOTE — Telephone Encounter (Signed)
ATC but could not reach patient.

## 2021-09-23 ENCOUNTER — Other Ambulatory Visit: Payer: Self-pay | Admitting: Family Medicine

## 2021-09-23 ENCOUNTER — Telehealth: Payer: Self-pay | Admitting: Family Medicine

## 2021-09-23 NOTE — Telephone Encounter (Signed)
Patient stated that she is out of the medication for her vertigo. She also stated that she's doing pretty well at this time but don't want it to flare back up.  Patient could be contacted at [phone].  Please advise.

## 2021-09-23 NOTE — Telephone Encounter (Signed)
Tramadol last filled 07/26/2021 Last OV 08/02/2021  Ok to fill?

## 2021-09-24 NOTE — Telephone Encounter (Signed)
Left message for patient to call back  

## 2021-09-25 NOTE — Telephone Encounter (Signed)
Left message for patient to call back  

## 2021-09-26 ENCOUNTER — Other Ambulatory Visit: Payer: Self-pay

## 2021-09-26 MED ORDER — MECLIZINE HCL 12.5 MG PO TABS: ORAL_TABLET | ORAL | 0 refills | Status: DC

## 2021-09-26 NOTE — Telephone Encounter (Signed)
Left message for patient to call back. Unable to reach the patient. Message will be closed.

## 2021-09-26 NOTE — Telephone Encounter (Signed)
Patient called back and confirmed that this is the correct medication. Rx has been sent in. Patient is aware.

## 2021-10-10 ENCOUNTER — Telehealth (INDEPENDENT_AMBULATORY_CARE_PROVIDER_SITE_OTHER): Payer: Medicare Other | Admitting: Family Medicine

## 2021-10-10 ENCOUNTER — Encounter: Payer: Self-pay | Admitting: Family Medicine

## 2021-10-10 DIAGNOSIS — R059 Cough, unspecified: Secondary | ICD-10-CM | POA: Diagnosis not present

## 2021-10-10 MED ORDER — DOXYCYCLINE HYCLATE 100 MG PO TABS: 100.0000 mg | ORAL_TABLET | Freq: Two times a day (BID) | ORAL | 0 refills | Status: DC

## 2021-10-10 NOTE — Patient Instructions (Signed)
-  I sent the medication(s) we discussed to your pharmacy: Meds ordered this encounter  Medications   doxycycline (VIBRA-TABS) 100 MG tablet    Sig: Take 1 tablet (100 mg total) by mouth 2 (two) times daily.    Dispense:  14 tablet    Refill:  0     I hope you are feeling better soon!  Seek in person care promptly if your symptoms worsen, new concerns arise or you are not improving with treatment.  It was nice to meet you today. I help [facility] out with telemedicine visits on Tuesdays and Thursdays and am happy to help if you need a virtual follow up visit on those days. Otherwise, if you have any concerns or questions following this visit please schedule a follow up visit with your Primary Care office or seek care at a local urgent care clinic to avoid delays in care

## 2021-10-10 NOTE — Progress Notes (Signed)
Virtual Visit via Telephone Note  I connected with Molly Patterson on 10/10/21 at 10:20 AM EST by telephone and verified that I am speaking with the correct person using two identifiers.   I discussed the limitations of performing an evaluation and management service by telephone and requested permission for a phone visit. The patient expressed understanding and agreed to proceed.  Location patient:  [place] Location provider: work or home office Participants present for the call: patient, provider Patient did not have a visit with me in the prior 7 days to address this/these issue(s).   History of Present Illness:  Acute telemedicine visit for Cough and congestion: -Onset: about 2 weeks ago -Symptoms include: started out as a cold,  a "bronchial cough"; coughing up some mucus, has used albuterol as needed -doing better, but annoying cough at times still -Denies: fevers, CP, SOB, malaise, body aches, NVD -Pertinent past medical history: see below, reports has had bronchitis in the past -Pertinent medication allergies:  Allergies  Allergen Reactions   Penicillins Shortness Of Breath and Swelling    REACTION: hives   Lidocaine-Epinephrine     REACTION: pulse rapic   Rofecoxib     Vioxx, itiching   -COVID-19 vaccine status: Immunization History  Administered Date(s) Administered   Fluad Quad(high Dose 65+) 06/22/2019, 08/24/2020, 08/02/2021   Influenza Split 08/18/2011, 07/19/2012   Influenza, High Dose Seasonal PF 07/25/2015, 07/22/2016, 07/27/2017, 07/13/2018   Influenza,inj,Quad PF,6+ Mos 07/04/2013, 07/21/2014   Pneumococcal Conjugate-13 07/25/2015   Pneumococcal Polysaccharide-23 09/15/2006   Td 09/15/2008      Past Medical History:  Diagnosis Date   Allergy    hayfever   ANEMIA-IRON DEFICIENCY 08/02/2009   BURSITIS, HIP 08/02/2009   HYPERTENSION 08/02/2009   OSTEOARTHRITIS, GENERALIZED 08/12/2010   Raynaud's syndrome 08/12/2010   Transfusion history 1964     Current Outpatient Medications on File Prior to Visit  Medication Sig Dispense Refill   amLODipine (NORVASC) 10 MG tablet TAKE ONE (1) TABLET EACH DAY 90 tablet 3   azelastine (ASTELIN) 0.1 % nasal spray Place 1 spray into both nostrils 2 (two) times daily. Use in each nostril as directed 30 mL 12   benazepril (LOTENSIN) 10 MG tablet TAKE ONE (1) TABLET EACH DAY 90 tablet 0   Calcium Carb-Cholecalciferol 600-500 MG-UNIT CAPS Take by mouth daily. Takes 4x per week.     diclofenac Sodium (VOLTAREN) 1 % GEL Apply 4 g topically 4 (four) times daily. 100 g 1   hydrochlorothiazide (MICROZIDE) 12.5 MG capsule TAKE ONE (1) CAPSULE EACH DAY 90 capsule 3   meclizine (ANTIVERT) 12.5 MG tablet Take 1 tablet ever 6 hours as needed for vertigo. 30 tablet 0   traMADol (ULTRAM) 50 MG tablet TAKE 1-2 TABLETS EVERY 6 HOURS AS NEEDED FOR PAIN 180 tablet 1   VENTOLIN HFA 108 (90 Base) MCG/ACT inhaler USE 2 PUFFS EVERY 6 HOURS AS NEEDED 18 g 1   No current facility-administered medications on file prior to visit.    Observations/Objective: Patient sounds cheerful and well on the phone. I do not appreciate any SOB. Speech and thought processing are grossly intact. Patient reported vitals:  Assessment and Plan:  Cough, unspecified type  -we discussed possible serious and likely etiologies, options for evaluation and workup, limitations of telemedicine visit vs in person visit, treatment, treatment risks and precautions. Pt prefers to treat via telemedicine empirically rather than in person at this moment. Sounds like she likely had a viral resp illness with maybe some bronchitis. She  opted for nasal saline, OTC cough products (as she prefers these to an rx), addition of abx (doxy Rx sent)  if worsening, more mucus or not continuing to improve.  Advise to seek prompt virtual visit or in person care if worsening, new symptoms arise, or if is not improving with treatment as expected per our conversation of  expected course.    I discussed the assessment and treatment plan with the patient. The patient was provided an opportunity to ask questions and all were answered. The patient agreed with the plan and demonstrated an understanding of the instructions.    Follow Up Instructions:  I did not refer this patient for an OV with me in the next 24 hours for this/these issue(s).  I discussed the assessment and treatment plan with the patient. The patient was provided an opportunity to ask questions and all were answered. The patient agreed with the plan and demonstrated an understanding of the instructions.   I spent 13 minutes on the date of this visit in the care of this patient. See summary of tasks completed to properly care for this patient in the detailed notes above which also included counseling of above, review of PMH, medications, allergies, evaluation of the patient and ordering and/or  instructing patient on testing and care options.     Lucretia Kern, DO

## 2021-11-18 DIAGNOSIS — H2513 Age-related nuclear cataract, bilateral: Secondary | ICD-10-CM | POA: Diagnosis not present

## 2021-11-18 DIAGNOSIS — H40033 Anatomical narrow angle, bilateral: Secondary | ICD-10-CM | POA: Diagnosis not present

## 2021-12-11 ENCOUNTER — Other Ambulatory Visit: Payer: Self-pay | Admitting: Family Medicine

## 2021-12-18 ENCOUNTER — Other Ambulatory Visit: Payer: Self-pay | Admitting: Family Medicine

## 2022-01-12 DIAGNOSIS — Z79899 Other long term (current) drug therapy: Secondary | ICD-10-CM | POA: Diagnosis not present

## 2022-01-12 DIAGNOSIS — Z888 Allergy status to other drugs, medicaments and biological substances status: Secondary | ICD-10-CM | POA: Diagnosis not present

## 2022-01-12 DIAGNOSIS — R1110 Vomiting, unspecified: Secondary | ICD-10-CM | POA: Diagnosis not present

## 2022-01-12 DIAGNOSIS — G319 Degenerative disease of nervous system, unspecified: Secondary | ICD-10-CM | POA: Diagnosis not present

## 2022-01-12 DIAGNOSIS — Z880 Allergy status to penicillin: Secondary | ICD-10-CM | POA: Diagnosis not present

## 2022-01-12 DIAGNOSIS — I10 Essential (primary) hypertension: Secondary | ICD-10-CM | POA: Diagnosis not present

## 2022-01-12 DIAGNOSIS — R42 Dizziness and giddiness: Secondary | ICD-10-CM | POA: Diagnosis not present

## 2022-01-13 ENCOUNTER — Telehealth: Payer: Self-pay

## 2022-01-13 NOTE — Telephone Encounter (Signed)
---  Caller states her mother in law has been having ?vertigo the last couple of years. This past Saturday ?her vertigo was really bad. She was vomiting, couldn't ?sleep, and her body is hurting and sore. She went to ?the hospital on Sunday and was given an IV to hydrate, ?and some Zofran for Nausea. They also did a CT Scan ?and some blood work and both came back normal. She ?is still not any better today. She was very sick to her ?stomach all night last night. She has not urinated in ?the last 11 hrs hours. No blood in her vomit. She is not ?able to eat or drink anything. ? ?01/13/2022 11:04:48 AM Call EMS 911 Now Carmon, RN, Langley Gauss ? ?Comments ?User: Romeo Apple, RN Date/Time Eilene Ghazi Time): 01/13/2022 11:02:07 AM ?Vomiting subsided and has severe weakness now. Unable to walk today but was able to walk on Friday. No ?diarrhea or fever ? ?01/13/22 1612: Upon review of chart, ED AVS instructions state: Rest, drink plenty of fluids, please continue your meclizine as needed for dizziness. Contact your primary care provider in the morning to arrange close outpatient follow-up, return the emergency department for any worsening symptoms or concerns.  ? ?Pt states she's feeling better after taking meclizine & zofran. Pt states she wants to f/u with Dr Elease Hashimoto the week of May 15th since that's when her 6 month f/u is. Appt scheduled for 01/28/22 ?

## 2022-01-15 ENCOUNTER — Telehealth: Payer: Self-pay | Admitting: Family Medicine

## 2022-01-15 NOTE — Telephone Encounter (Signed)
Pt in bed with vertigo, having issues with acid reflux, taking Mylanta OTC for quick relief, pharmacist advised she get prescription strength to assist. Pls advised patient's spouse as to whether this can be done. [phone].  ?[facility] - [place], [place] Phone:  [phone]  ?Fax:  [phone]  ?  ? ?

## 2022-01-15 NOTE — Telephone Encounter (Signed)
Please advise 

## 2022-01-16 NOTE — Telephone Encounter (Signed)
Spoke with pt husband, message given. ?

## 2022-01-17 DIAGNOSIS — R110 Nausea: Secondary | ICD-10-CM | POA: Diagnosis not present

## 2022-01-17 DIAGNOSIS — R42 Dizziness and giddiness: Secondary | ICD-10-CM | POA: Diagnosis not present

## 2022-01-17 DIAGNOSIS — Z880 Allergy status to penicillin: Secondary | ICD-10-CM | POA: Diagnosis not present

## 2022-01-17 DIAGNOSIS — R112 Nausea with vomiting, unspecified: Secondary | ICD-10-CM | POA: Diagnosis not present

## 2022-01-17 DIAGNOSIS — R1111 Vomiting without nausea: Secondary | ICD-10-CM | POA: Diagnosis not present

## 2022-01-17 DIAGNOSIS — H8110 Benign paroxysmal vertigo, unspecified ear: Secondary | ICD-10-CM | POA: Diagnosis not present

## 2022-01-17 DIAGNOSIS — I10 Essential (primary) hypertension: Secondary | ICD-10-CM | POA: Diagnosis not present

## 2022-01-20 ENCOUNTER — Other Ambulatory Visit: Payer: Self-pay

## 2022-01-20 ENCOUNTER — Telehealth: Payer: Self-pay | Admitting: Family Medicine

## 2022-01-20 ENCOUNTER — Inpatient Hospital Stay (HOSPITAL_COMMUNITY): Payer: Medicare Other

## 2022-01-20 ENCOUNTER — Inpatient Hospital Stay (HOSPITAL_COMMUNITY)
Admission: EM | Admit: 2022-01-20 | Discharge: 2022-01-27 | DRG: 066 | Disposition: A | Discharge status: Rehabilitation Facility | Payer: Medicare Other | Attending: Internal Medicine | Admitting: Internal Medicine

## 2022-01-20 ENCOUNTER — Encounter (HOSPITAL_COMMUNITY): Payer: Self-pay

## 2022-01-20 ENCOUNTER — Emergency Department (HOSPITAL_COMMUNITY): Payer: Medicare Other

## 2022-01-20 DIAGNOSIS — Z79891 Long term (current) use of opiate analgesic: Secondary | ICD-10-CM

## 2022-01-20 DIAGNOSIS — I6389 Other cerebral infarction: Secondary | ICD-10-CM | POA: Diagnosis not present

## 2022-01-20 DIAGNOSIS — I614 Nontraumatic intracerebral hemorrhage in cerebellum: Secondary | ICD-10-CM | POA: Diagnosis not present

## 2022-01-20 DIAGNOSIS — Z833 Family history of diabetes mellitus: Secondary | ICD-10-CM | POA: Diagnosis not present

## 2022-01-20 DIAGNOSIS — S06310A Contusion and laceration of right cerebrum without loss of consciousness, initial encounter: Secondary | ICD-10-CM | POA: Diagnosis not present

## 2022-01-20 DIAGNOSIS — I69198 Other sequelae of nontraumatic intracerebral hemorrhage: Secondary | ICD-10-CM | POA: Diagnosis not present

## 2022-01-20 DIAGNOSIS — Z880 Allergy status to penicillin: Secondary | ICD-10-CM | POA: Diagnosis not present

## 2022-01-20 DIAGNOSIS — Z888 Allergy status to other drugs, medicaments and biological substances status: Secondary | ICD-10-CM

## 2022-01-20 DIAGNOSIS — M1990 Unspecified osteoarthritis, unspecified site: Secondary | ICD-10-CM | POA: Diagnosis not present

## 2022-01-20 DIAGNOSIS — I672 Cerebral atherosclerosis: Secondary | ICD-10-CM | POA: Diagnosis not present

## 2022-01-20 DIAGNOSIS — R2689 Other abnormalities of gait and mobility: Secondary | ICD-10-CM | POA: Diagnosis not present

## 2022-01-20 DIAGNOSIS — I629 Nontraumatic intracranial hemorrhage, unspecified: Secondary | ICD-10-CM | POA: Diagnosis not present

## 2022-01-20 DIAGNOSIS — I7300 Raynaud's syndrome without gangrene: Secondary | ICD-10-CM | POA: Diagnosis not present

## 2022-01-20 DIAGNOSIS — I619 Nontraumatic intracerebral hemorrhage, unspecified: Secondary | ICD-10-CM | POA: Diagnosis present

## 2022-01-20 DIAGNOSIS — Z8616 Personal history of COVID-19: Secondary | ICD-10-CM

## 2022-01-20 DIAGNOSIS — Z79899 Other long term (current) drug therapy: Secondary | ICD-10-CM | POA: Diagnosis not present

## 2022-01-20 DIAGNOSIS — K219 Gastro-esophageal reflux disease without esophagitis: Secondary | ICD-10-CM | POA: Diagnosis not present

## 2022-01-20 DIAGNOSIS — I6523 Occlusion and stenosis of bilateral carotid arteries: Secondary | ICD-10-CM | POA: Diagnosis not present

## 2022-01-20 DIAGNOSIS — E785 Hyperlipidemia, unspecified: Secondary | ICD-10-CM | POA: Diagnosis present

## 2022-01-20 DIAGNOSIS — R110 Nausea: Secondary | ICD-10-CM | POA: Diagnosis not present

## 2022-01-20 DIAGNOSIS — I6782 Cerebral ischemia: Secondary | ICD-10-CM | POA: Diagnosis not present

## 2022-01-20 DIAGNOSIS — Z881 Allergy status to other antibiotic agents status: Secondary | ICD-10-CM

## 2022-01-20 DIAGNOSIS — I6621 Occlusion and stenosis of right posterior cerebral artery: Secondary | ICD-10-CM | POA: Diagnosis not present

## 2022-01-20 DIAGNOSIS — D72829 Elevated white blood cell count, unspecified: Secondary | ICD-10-CM | POA: Diagnosis not present

## 2022-01-20 DIAGNOSIS — Z8249 Family history of ischemic heart disease and other diseases of the circulatory system: Secondary | ICD-10-CM | POA: Diagnosis not present

## 2022-01-20 DIAGNOSIS — Z8042 Family history of malignant neoplasm of prostate: Secondary | ICD-10-CM | POA: Diagnosis not present

## 2022-01-20 DIAGNOSIS — I10 Essential (primary) hypertension: Secondary | ICD-10-CM | POA: Diagnosis not present

## 2022-01-20 DIAGNOSIS — R42 Dizziness and giddiness: Secondary | ICD-10-CM | POA: Diagnosis not present

## 2022-01-20 HISTORY — DX: COVID-19: U07.1

## 2022-01-20 HISTORY — DX: Basal cell carcinoma of skin of unspecified upper limb, including shoulder: C44.611

## 2022-01-20 HISTORY — DX: Dizziness and giddiness: R42

## 2022-01-20 LAB — COMPREHENSIVE METABOLIC PANEL
ALT: 15 U/L (ref 0–44)
AST: 17 U/L (ref 15–41)
Albumin: 3.6 g/dL (ref 3.5–5.0)
Alkaline Phosphatase: 70 U/L (ref 38–126)
Anion gap: 9 (ref 5–15)
BUN: 9 mg/dL (ref 8–23)
CO2: 27 mmol/L (ref 22–32)
Calcium: 9.2 mg/dL (ref 8.9–10.3)
Chloride: 102 mmol/L (ref 98–111)
Creatinine, Ser: 0.62 mg/dL (ref 0.44–1.00)
GFR, Estimated: >60 mL/min (ref >60)
Glucose, Bld: 119 mg/dL — ABNORMAL HIGH (ref 70–99)
Potassium: 4.1 mmol/L (ref 3.5–5.1)
Sodium: 138 mmol/L (ref 135–145)
Total Bilirubin: 0.6 mg/dL (ref 0.3–1.2)
Total Protein: 6.7 g/dL (ref 6.5–8.1)

## 2022-01-20 LAB — CBC WITH DIFFERENTIAL/PLATELET
Abs Immature Granulocytes: 0.1 10*3/uL — ABNORMAL HIGH (ref 0.00–0.07)
Basophils Absolute: 0.1 10*3/uL (ref 0.0–0.1)
Basophils Relative: 1 %
Eosinophils Absolute: 0.1 10*3/uL (ref 0.0–0.5)
Eosinophils Relative: 1 %
HCT: 49.1 % — ABNORMAL HIGH (ref 36.0–46.0)
Hemoglobin: 15.9 g/dL — ABNORMAL HIGH (ref 12.0–15.0)
Immature Granulocytes: 1 %
Lymphocytes Relative: 11 %
Lymphs Abs: 1.1 10*3/uL (ref 0.7–4.0)
MCH: 29.1 pg (ref 26.0–34.0)
MCHC: 32.4 g/dL (ref 30.0–36.0)
MCV: 89.8 fL (ref 80.0–100.0)
Monocytes Absolute: 0.7 10*3/uL (ref 0.1–1.0)
Monocytes Relative: 7 %
Neutro Abs: 7.6 10*3/uL (ref 1.7–7.7)
Neutrophils Relative %: 79 %
Platelets: 311 10*3/uL (ref 150–400)
RBC: 5.47 MIL/uL — ABNORMAL HIGH (ref 3.87–5.11)
RDW: 15.5 % (ref 11.5–15.5)
WBC: 9.6 10*3/uL (ref 4.0–10.5)
nRBC: 0 % (ref 0.0–0.2)

## 2022-01-20 LAB — URINALYSIS, ROUTINE W REFLEX MICROSCOPIC
Bilirubin Urine: NEGATIVE
Glucose, UA: NEGATIVE mg/dL
Hgb urine dipstick: NEGATIVE
Ketones, ur: 5 mg/dL — AB
Leukocytes,Ua: NEGATIVE
Nitrite: NEGATIVE
Protein, ur: NEGATIVE mg/dL
Specific Gravity, Urine: 1.027 (ref 1.005–1.030)
pH: 8 (ref 5.0–8.0)

## 2022-01-20 LAB — MRSA NEXT GEN BY PCR, NASAL: MRSA by PCR Next Gen: NOT DETECTED

## 2022-01-20 MED ORDER — MECLIZINE HCL 25 MG PO TABS
12.5000 mg | ORAL_TABLET | Freq: Once | ORAL | Status: AC
Start: 2022-01-20 — End: 2022-01-20
  Administered 2022-01-20: 12.5 mg via ORAL
  Filled 2022-01-20: qty 1

## 2022-01-20 MED ORDER — ACETAMINOPHEN 650 MG RE SUPP: 650.0000 mg | RECTAL | Status: DC | PRN

## 2022-01-20 MED ORDER — ACETAMINOPHEN 325 MG PO TABS
650.0000 mg | ORAL_TABLET | ORAL | Status: DC | PRN
  Administered 2022-01-21: 650 mg via ORAL
  Filled 2022-01-20: qty 2

## 2022-01-20 MED ORDER — IOHEXOL 350 MG/ML SOLN
75.0000 mL | Freq: Once | INTRAVENOUS | Status: AC | PRN
  Administered 2022-01-20: 75 mL via INTRAVENOUS

## 2022-01-20 MED ORDER — ONDANSETRON HCL 4 MG PO TABS: ORAL_TABLET | ORAL | 0 refills | Status: DC

## 2022-01-20 MED ORDER — MECLIZINE HCL 12.5 MG PO TABS
12.5000 mg | ORAL_TABLET | Freq: Three times a day (TID) | ORAL | Status: DC | PRN
  Administered 2022-01-20 – 2022-01-27 (×11): 12.5 mg via ORAL
  Filled 2022-01-20 (×17): qty 1

## 2022-01-20 MED ORDER — PANTOPRAZOLE SODIUM 40 MG IV SOLR
40.0000 mg | Freq: Every day | INTRAVENOUS | Status: DC
  Administered 2022-01-20: 40 mg via INTRAVENOUS
  Filled 2022-01-20: qty 10

## 2022-01-20 MED ORDER — STROKE: EARLY STAGES OF RECOVERY BOOK
Freq: Once | Status: AC
  Filled 2022-01-20: qty 1

## 2022-01-20 MED ORDER — SENNOSIDES-DOCUSATE SODIUM 8.6-50 MG PO TABS
1.0000 | ORAL_TABLET | Freq: Two times a day (BID) | ORAL | Status: DC
  Administered 2022-01-20 – 2022-01-22 (×5): 1 via ORAL
  Filled 2022-01-20 (×5): qty 1

## 2022-01-20 MED ORDER — ENSURE ENLIVE PO LIQD
237.0000 mL | Freq: Two times a day (BID) | ORAL | Status: DC
  Administered 2022-01-21 – 2022-01-27 (×12): 237 mL via ORAL

## 2022-01-20 MED ORDER — ONDANSETRON HCL 4 MG PO TABS
4.0000 mg | ORAL_TABLET | Freq: Three times a day (TID) | ORAL | Status: DC | PRN
  Administered 2022-01-20 – 2022-01-23 (×7): 4 mg via ORAL
  Filled 2022-01-20 (×7): qty 1

## 2022-01-20 MED ORDER — ONDANSETRON HCL 4 MG/2ML IJ SOLN
4.0000 mg | Freq: Once | INTRAMUSCULAR | Status: AC
  Administered 2022-01-20: 4 mg via INTRAVENOUS
  Filled 2022-01-20: qty 2

## 2022-01-20 MED ORDER — ACETAMINOPHEN 160 MG/5ML PO SOLN: 650.0000 mg | ORAL | Status: DC | PRN

## 2022-01-20 MED ORDER — CLEVIDIPINE BUTYRATE 0.5 MG/ML IV EMUL
0.0000 mg/h | INTRAVENOUS | Status: DC
  Administered 2022-01-20: 2 mg/h via INTRAVENOUS
  Filled 2022-01-20: qty 50

## 2022-01-20 NOTE — ED Notes (Signed)
Patient transported to CT 

## 2022-01-20 NOTE — Telephone Encounter (Signed)
Pt husband is calling and would like a refill on zofran 4 mg, This med was prescribed when pt is in er. ?[facility] - [place], [place] Phone:  [phone]  ?Fax:  [phone]  ?  ? ?

## 2022-01-20 NOTE — Consult Note (Addendum)
Providing Compassionate, Quality Care - Together   Reason for Consult: Intraparenchymal hemorrhage Referring Physician: Dr. Ascencion Dike Molly Patterson is an 88 y.o. female.  HPI: Patient is an 88 year old female with a history significant for vertigo, hypertension, osteoarthritis, anemia, and osteoarthritis. She has been having issues with significant dizziness over the last 10 days. She reports significant nausea and vomiting with any change in position. She has been unable to eat or drink much. She presented to [facility] on two separate occasions, 01/12/2022 and 01/17/2022, with these symptoms. Approximately 2 years ago, the patient was diagnosed with vertigo and reports her symptoms were very similar to the ones she is experiencing at present.. The ED provider at [facility] recommended she continue meclizine and Zofran at discharge for her symptoms. She did have a CT head at [facility] on 01/12/2022 for which the radiologist reported no acute intracranial abnormality. CT head performed at [facility] today show a small acute intraparenchymal hemorrhage in the right cerebellum. Neurosurgery was consulted for further evaluation and recommendations.    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 She has never used smokeless tobacco. She reports that she does not drink alcohol and does not use drugs.  Allergies:  Allergies  Allergen  Reactions   Penicillins Shortness Of Breath and Swelling    REACTION: hives   Lidocaine-Epinephrine     REACTION: pulse rapic   Rofecoxib     Vioxx, itiching    Medications: I have reviewed the patient's current medications.  Results for orders placed or performed during the hospital encounter of 01/20/22 (from the past 48 hour(s))  Comprehensive metabolic panel     Status: Abnormal   Collection Time: 01/20/22  1:59 PM  Result Value Ref Range   Sodium 138 135 - 145 mmol/L   Potassium 4.1 3.5 - 5.1 mmol/L   Chloride 102 98 - 111 mmol/L   CO2 27 22 - 32 mmol/L   Glucose, Bld 119 (H) 70 - 99 mg/dL    Comment: Glucose reference range applies only to samples taken after fasting for at least 8 hours.   BUN 9 8 - 23 mg/dL   Creatinine, Ser 5.40 0.44 - 1.00 mg/dL   Calcium 9.2 8.9 - 98.1 mg/dL   Total Protein 6.7 6.5 - 8.1 g/dL   Albumin 3.6 3.5 - 5.0 g/dL   AST 17 15 - 41 U/L   ALT 15 0 - 44 U/L   Alkaline Phosphatase 70 38 - 126 U/L   Total Bilirubin 0.6 0.3 - 1.2 mg/dL   GFR, Estimated >19 >14 mL/min    Comment: (NOTE) Calculated using the CKD-EPI Creatinine Equation (2021)    Anion gap 9 5 -  15    Comment: Performed at [facility], [address] [place], [place] [address]  CBC with Differential     Status: Abnormal   Collection Time: 01/20/22  1:59 PM  Result Value Ref Range   WBC 9.6 4.0 - 10.5 K/uL   RBC 5.47 (H) 3.87 - 5.11 MIL/uL   Hemoglobin 15.9 (H) 12.0 - 15.0 g/dL   HCT 91.4 (H) 78.2 - 95.6 %   MCV 89.8 80.0 - 100.0 fL   MCH 29.1 26.0 - 34.0 pg   MCHC 32.4 30.0 - 36.0 g/dL   RDW 21.3 08.6 - 57.8 %   Platelets 311 150 - 400 K/uL   nRBC 0.0 0.0 - 0.2 %   Neutrophils Relative % 79 %   Neutro Abs 7.6 1.7 - 7.7 K/uL   Lymphocytes Relative 11 %   Lymphs Abs 1.1 0.7 - 4.0 K/uL   Monocytes Relative 7 %   Monocytes Absolute 0.7 0.1 - 1.0 K/uL   Eosinophils Relative 1 %   Eosinophils Absolute 0.1 0.0 - 0.5 K/uL   Basophils Relative 1 %   Basophils Absolute  0.1 0.0 - 0.1 K/uL   Immature Granulocytes 1 %   Abs Immature Granulocytes 0.10 (H) 0.00 - 0.07 K/uL    Comment: Performed at [facility], [address] [place], [place] [address]    CT Head Wo Contrast  Result Date: 01/20/2022 CLINICAL DATA:  Dizziness. EXAM: CT HEAD WITHOUT CONTRAST TECHNIQUE: Contiguous axial images were obtained from the base of the skull through the vertex without intravenous contrast. RADIATION DOSE REDUCTION: This exam was performed according to the departmental dose-optimization program which includes automated exposure control, adjustment of the mA and/or kV according to patient size and/or use of iterative reconstruction technique. COMPARISON:  CT head dated January 12, 2022. FINDINGS: Brain: New small acute intraparenchymal hemorrhage in the right cerebellum (series 3, image 7). No intraventricular extension. No evidence of acute infarction, hydrocephalus, extra-axial collection or mass lesion/mass effect. Stable mild atrophy and chronic microvascular ischemic changes, within normal limits for age. Vascular: Atherosclerotic vascular calcification of the carotid siphons. No hyperdense vessel. Skull: Normal. Negative for fracture or focal lesion. Sinuses/Orbits: No acute finding. Other: None. IMPRESSION: 1. New small acute intraparenchymal hemorrhage in the right cerebellum. Critical Value/emergent results were called by telephone at the time of interpretation on 01/20/2022 at 2:25 pm to provider [facility], who verbally acknowledged these results. Electronically Signed   By: Obie Dredge M.D.   On: 01/20/2022 14:25    Review of Systems  Constitutional:  Positive for malaise/fatigue and weight loss. Negative for chills and fever.  HENT: Negative.    Eyes: Negative.   Respiratory: Negative.    Cardiovascular: Negative.   Gastrointestinal:  Positive for abdominal pain, nausea and vomiting. Negative for diarrhea.  Genitourinary: Negative.   Musculoskeletal:   Positive for falls. Negative for back pain and neck pain.  Skin: Negative.   Neurological:  Positive for dizziness and headaches. Negative for tingling, speech change, focal weakness and weakness.  Psychiatric/Behavioral: Negative.    Blood pressure (!) 142/80, pulse 88, temperature (!) 97.4 F (36.3 C), temperature source Oral, resp. rate 18, height 5' 2.75" (1.594 m), weight 57.9 kg, SpO2 98 %. Estimated body mass index is 22.79 kg/m as calculated from the following:   Height as of this encounter: 5' 2.75" (1.594 m).   Weight as of this encounter: 57.9 kg.  Physical Exam Constitutional:  Appearance: Normal appearance.  HENT:     Head: Normocephalic and atraumatic.     Nose: Nose normal.     Mouth/Throat:     Mouth: Mucous membranes are dry.  Eyes:     Extraocular Movements: Extraocular movements intact.     Pupils: Pupils are equal, round, and reactive to light.  Cardiovascular:     Rate and Rhythm: Normal rate and regular rhythm.     Pulses: Normal pulses.  Pulmonary:     Effort: Pulmonary effort is normal. No respiratory distress.  Abdominal:     General: Abdomen is flat.     Palpations: Abdomen is soft.  Musculoskeletal:        General: Normal range of motion.     Cervical back: Normal range of motion and neck supple.  Skin:    General: Skin is warm and dry.     Capillary Refill: Capillary refill takes less than 2 seconds.  Neurological:     General: No focal deficit present.     Mental Status: She is alert and oriented to person, place, and time.     Sensory: Sensation is intact.     Motor: Motor function is intact.     Coordination: Finger-Nose-Finger Test abnormal.  Psychiatric:        Mood and Affect: Mood normal.        Behavior: Behavior normal.    Assessment/Plan: Patient presented to [facility] ED today with a complaint of severe dizziness. CT imaging demonstrates an abnormality in the right cerebellum that was also present in the imaging performed at  [facility] on 01/12/2022. No surgical intervention recommended at present. Neurosurgery will continue to follow and reevaluate following MRI brain.    Val Eagle, DNP, AGNP-C Nurse Practitioner  [facility] [address], [address], [place], [place] [address] P: [phone]    F: [phone]  01/20/2022, 4:13 PM  I have reviewed the patient's head CT's.  She had similar findings on a previous CAT scan on 01/12/2022.  We will await her brain MRI for further recommendations.

## 2022-01-20 NOTE — ED Provider Notes (Signed)
?[facility] ?Provider Note ? ? ?CSN: [id number] ?Arrival date & time: 01/20/22  1319 ? ?  ? ?History ? ?Chief Complaint  ?Patient presents with  ? Dizziness  ? ? ?Molly Patterson is a 88 y.o. female. ? ?87 year old female with prior medical history as detailed below presents for evaluation.  Patient reports she has had nearly 10 days of persistent vertigo and dizziness.  Patient reports that with any movement of her head she has significant vertigo with associated nausea and vomiting. ? ?Patient reports that she was seen for same at [facility] on Friday and Sunday and treated.  Apparently she was diagnosed with severe vertigo these prior evaluations. ? ? ? ?The history is provided by the patient and medical records.  ?Dizziness ?Quality:  Vertigo ?Severity:  Severe ?Onset quality:  Sudden ?Duration:  10 days ?Timing:  Constant ?Progression:  Unchanged ?Chronicity:  New ? ?  ? ?Home Medications ?Prior to Admission medications   ?Medication Sig Start Date End Date Taking? Authorizing Provider  ?amLODipine (NORVASC) 10 MG tablet TAKE ONE (1) TABLET EACH DAY 12/18/21   Burchette, [name], MD  ?azelastine (ASTELIN) 0.1 % nasal spray Place 1 spray into both nostrils 2 (two) times daily. Use in each nostril as directed 11/23/18   Burchette, [name], MD  ?benazepril (LOTENSIN) 10 MG tablet TAKE ONE (1) TABLET EACH DAY 12/18/21   Burchette, [name], MD  ?Calcium Carb-Cholecalciferol 600-500 MG-UNIT CAPS Take by mouth daily. Takes 4x per week.    [provider]  ?diclofenac Sodium (VOLTAREN) 1 % GEL Apply 4 g topically 4 (four) times daily. 12/12/19   Burchette, [name], MD  ?doxycycline (VIBRA-TABS) 100 MG tablet Take 1 tablet (100 mg total) by mouth 2 (two) times daily. 10/10/21   Lucretia Kern, DO  ?hydrochlorothiazide (MICROZIDE) 12.5 MG capsule TAKE ONE (1) CAPSULE EACH DAY 10/15/20   Burchette, [name], MD  ?meclizine (ANTIVERT) 12.5 MG tablet Take 1 tablet ever 6 hours  as needed for vertigo. 09/26/21   Burchette, [name], MD  ?ondansetron (ZOFRAN) 4 MG tablet Take 1 tablet every 8 hours as needed for nausea/vomiting 01/20/22   Burchette, [name], MD  ?traMADol (ULTRAM) 50 MG tablet TAKE 1-2 TABLETS EVERY 6 HOURS AS NEEDED FOR PAIN 12/11/21   Eulas Post, MD  ?VENTOLIN HFA 108 (90 Base) MCG/ACT inhaler USE 2 PUFFS EVERY 6 HOURS AS NEEDED 08/12/21   Burchette, [name], MD  ?   ? ?Allergies    ?Penicillins, Lidocaine-epinephrine, and Rofecoxib   ? ?Review of Systems   ?Review of Systems  ?Neurological:  Positive for dizziness.  ?All other systems reviewed and are negative. ? ?Physical Exam ?Updated Vital Signs ?BP (!) 147/73   Pulse 89   Temp (!) 97.4 ?F (36.3 ?C) (Oral)   Resp (!) 21   Ht 5' 2.75" (1.594 m)   Wt 57.9 kg   SpO2 97%   BMI 22.79 kg/m?  ?Physical Exam ?Vitals and nursing note reviewed.  ?Constitutional:   ?   General: She is not in acute distress. ?   Appearance: Normal appearance. She is well-developed.  ?HENT:  ?   Head: Normocephalic and atraumatic.  ?Eyes:  ?   Conjunctiva/sclera: Conjunctivae normal.  ?   Pupils: Pupils are equal, round, and reactive to light.  ?Cardiovascular:  ?   Rate and Rhythm: Normal rate and regular rhythm.  ?   Heart sounds: Normal heart sounds.  ?Pulmonary:  ?  Effort: Pulmonary effort is normal. No respiratory distress.  ?   Breath sounds: Normal breath sounds.  ?Abdominal:  ?   General: There is no distension.  ?   Palpations: Abdomen is soft.  ?   Tenderness: There is no abdominal tenderness.  ?Musculoskeletal:     ?   General: No deformity. Normal range of motion.  ?   Cervical back: Normal range of motion and neck supple.  ?Skin: ?   General: Skin is warm and dry.  ?Neurological:  ?   General: No focal deficit present.  ?   Mental Status: She is alert and oriented to person, place, and time. Mental status is at baseline.  ? ? ?ED Results / Procedures / Treatments   ?Labs ?(all labs ordered are listed, but only abnormal  results are displayed) ?Labs Reviewed  ?CBC WITH DIFFERENTIAL/PLATELET - Abnormal; Notable for the following components:  ?    Result Value  ? RBC 5.47 (*)   ? Hemoglobin 15.9 (*)   ? HCT 49.1 (*)   ? Abs Immature Granulocytes 0.10 (*)   ? All other components within normal limits  ?COMPREHENSIVE METABOLIC PANEL  ?URINALYSIS, ROUTINE W REFLEX MICROSCOPIC  ? ? ?EKG ?EKG Interpretation ? ?Date/Time:  Monday Jan 20 2022 13:29:39 EDT ?Ventricular Rate:  84 ?PR Interval:  164 ?QRS Duration: 76 ?QT Interval:  386 ?QTC Calculation: 457 ?R Axis:   49 ?Text Interpretation: Sinus rhythm Ventricular premature complex Aberrant conduction of SV complex(es) Consider anterior infarct Confirmed by Dene Gentry [phone]) on 01/20/2022 2:21:51 PM ? ?Radiology ?CT Head Wo Contrast ? ?Result Date: 01/20/2022 ?CLINICAL DATA:  Dizziness. EXAM: CT HEAD WITHOUT CONTRAST TECHNIQUE: Contiguous axial images were obtained from the base of the skull through the vertex without intravenous contrast. RADIATION DOSE REDUCTION: This exam was performed according to the departmental dose-optimization program which includes automated exposure control, adjustment of the mA and/or kV according to patient size and/or use of iterative reconstruction technique. COMPARISON:  CT head dated January 12, 2022. FINDINGS: Brain: New small acute intraparenchymal hemorrhage in the right cerebellum (series 3, image 7). No intraventricular extension. No evidence of acute infarction, hydrocephalus, extra-axial collection or mass lesion/mass effect. Stable mild atrophy and chronic microvascular ischemic changes, within normal limits for age. Vascular: Atherosclerotic vascular calcification of the carotid siphons. No hyperdense vessel. Skull: Normal. Negative for fracture or focal lesion. Sinuses/Orbits: No acute finding. Other: None. IMPRESSION: 1. New small acute intraparenchymal hemorrhage in the right cerebellum. Critical Value/emergent results were called by telephone at  the time of interpretation on 01/20/2022 at 2:25 pm to provider [facility], who verbally acknowledged these results. Electronically Signed   By: Titus Dubin M.D.   On: 01/20/2022 14:25   ? ?Procedures ?Procedures  ? ? ?Medications Ordered in ED ?Medications  ?ondansetron (ZOFRAN) injection 4 mg (4 mg Intravenous Given 01/20/22 1425)  ?meclizine (ANTIVERT) tablet 12.5 mg (12.5 mg Oral Given 01/20/22 1431)  ? ? ?ED Course/ Medical Decision Making/ A&P ?  ?                        ?Medical Decision Making ?Amount and/or Complexity of Data Reviewed ?Labs: ordered. ?Radiology: ordered. ? ?Risk ?Prescription drug management. ?Decision regarding hospitalization. ? ? ?Medical Screen Complete ? ?This patient presented to the ED with complaint of vertigo, dizziness. ? ?This complaint involves an extensive number of treatment options. The initial differential diagnosis includes, but is not limited to, vertigo, intracranial pathology  such as stroke or hemorrhage ? ?This presentation is: Acute, Chronic, Self-Limited, Previously Undiagnosed, Uncertain Prognosis, Complicated, Systemic Symptoms, and Threat to Life/Bodily Function ? ?Patient is presenting with complaint of significant persistent vertigo for the last 10 days ? ?She reports worsening symptoms over the last 24 hours. ? ?She reports prior work-up at [facility] without diagnosis other than severe vertigo. ? ?CT imaging today is demonstrative of possible acute hemorrhage. ? ?Neurology is aware case and will evaluate for admission. ? ?Neurosurgery is aware case and will consult ? ?Additional history obtained: ? ?External records from outside sources obtained and reviewed including prior ED visits and prior Inpatient records.  ? ? ?Lab Tests: ? ?I ordered and personally interpreted labs.  The pertinent results include: CBC, CMP, UA ? ? ?Imaging Studies ordered: ? ?I ordered imaging studies including CT head, MRI brain ?I independently visualized and interpreted  obtained imaging which showed likely acute hemorrhage in the right cerebellum ?I agree with the radiologist interpretation. ? ? ?Cardiac Monitoring: ? ?The patient was maintained on a cardiac monitor.  I personally vi

## 2022-01-20 NOTE — Telephone Encounter (Signed)
---  caller states pt is dizzy, is throwing up. denies any ?other sx. has had 3 attacks today. was taken to ED by ?ambulance. has been seen in the ED twice last week. ? ?01/20/2022 10:00:15 AM Go to ED Now (or PCP triage) Altamease Oiler, RN, Adriana ? ?Comments ?User: Kizzie Fantasia, RN Date/Time Eilene Ghazi Time): 01/20/2022 10:08:39 AM ?spoke to margaret at the office. will send message to dr's team. pt refused to be seen today as she doesn't feel well ?enough to ride in the car ? ?Referrals ?GO TO FACILITY UNDECIDED ?

## 2022-01-20 NOTE — H&P (Signed)
NEUROLOGY H&P  ? ?Date of service: Jan 20, 2022 ?Patient Name: Molly Patterson ?MRN:  [id number] ?DOB:  July 24, 1932 ?Reason for admission: R cerebellar ICH ?Requesting physician: Dr. Francia Greaves ?_ _ _   _ __   _ __ _ _  __ __   _ __   __ _ ? ?History of Present Illness  ? 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  Approximately 10 days ago she developed severe dizziness/room spinning with nausea and vomiting and symptoms have persisted since that time.  She presented to [facility] on 2 separate occasions within the past week on April 30 and May 5.  We do not have actual images available from [facility] but a head CT on that first ED visit was interpreted as no acute intracranial process.  She did not have MRI imaging.  CT head performed at [facility] today shows a small right cerebellar intraparenchymal hemorrhage.  Patient states that she was told that she had some kind of venous anomaly which did bleed in the 1980s or 90s but she did not have to have surgery at that time and does not have any further specific information about this.  Her exam is nonfocal although she has persistent dizziness and nausea.  He has not been able to really eat or drink for the past several days due to nausea.  She is not on anticoagulation.  No history of trauma. ? ?Imaging in ED: ? ?CTA showed no LVO, aneurysm, or vascular malformation. Severe R P2 stenosis ?CT head wo showed new small acute IPH R cerebellum with ?surrounding hypodensity c/f subacute ischemic infarct ? ?CNS imaging personally reviewed. ? ?  ?ROS  ? ?Per HPI: all other systems reviewed and are negative ? ?Past History  ? ?I have reviewed the following: ? ?Past Medical History:  ?Diagnosis Date  ? Allergy   ? hayfever  ? ANEMIA-IRON DEFICIENCY 08/02/2009  ? BURSITIS, HIP 08/02/2009  ? COVID-19   ? HYPERTENSION 08/02/2009  ? OSTEOARTHRITIS, GENERALIZED 08/12/2010  ? Raynaud's syndrome 08/12/2010   ? Transfusion history 09/15/1962  ? ?Past Surgical History:  ?Procedure Laterality Date  ? APPENDECTOMY    ? TONSILLECTOMY    ? ?Family History  ?Problem Relation Age of Onset  ? Arthritis Neg Hx   ?     family  ? Hypertension Neg Hx   ?     family  ? Diabetes Neg Hx   ?     family  ? Prostate cancer Neg Hx   ?     family  ? ?Social History  ? ?Socioeconomic History  ? Marital status: Married  ?  Spouse name: Not on file  ? Number of children: Not on file  ? Years of education: Not on file  ? Highest education level: Not on file  ?Occupational History  ? Not on file  ?Tobacco Use  ? Smoking status: Never  ? Smokeless tobacco: Never  ?Vaping Use  ? Vaping Use: Never used  ?Substance and Sexual Activity  ? Alcohol use: No  ? Drug use: Never  ? Sexual activity: Not on file  ?Other Topics Concern  ? Not on file  ?Social History Narrative  ? Not on file  ? ?Social Determinants of Health  ? ?Financial Resource Strain: Not on file  ?Food Insecurity: Not on file  ?Transportation Needs: Not on file  ?Physical Activity: Not on file  ?Stress: Not on file  ?  Social Connections: Not on file  ? ?Allergies  ?Allergen Reactions  ? Penicillins Shortness Of Breath and Swelling  ?  REACTION: hives  ? Lidocaine-Epinephrine   ?  REACTION: pulse rapic  ? Rofecoxib   ?  Vioxx, itiching  ? ? ?Medications  ? ?Medications Prior to Admission  ?Medication Sig Dispense Refill Last Dose  ? amLODipine (NORVASC) 10 MG tablet TAKE ONE (1) TABLET EACH DAY (Patient taking differently: Take 10 mg by mouth daily.) 90 tablet 0 01/19/2022  ? azelastine (ASTELIN) 0.1 % nasal spray Place 1 spray into both nostrils 2 (two) times daily. Use in each nostril as directed (Patient taking differently: Place 1 spray into both nostrils daily as needed for rhinitis or allergies.) 30 mL 12 12/05/2021  ? benazepril (LOTENSIN) 10 MG tablet TAKE ONE (1) TABLET EACH DAY (Patient taking differently: Take 10 mg by mouth daily.) 90 tablet 0 01/19/2022  ? cholecalciferol (VITAMIN  D3) 25 MCG (1000 UNIT) tablet Take 1,000 Units by mouth daily.   Past Week  ? hydrochlorothiazide (MICROZIDE) 12.5 MG capsule TAKE ONE (1) CAPSULE EACH DAY (Patient taking differently: Take 12.5 mg by mouth daily.) 90 capsule 3 01/18/2022  ? meclizine (ANTIVERT) 12.5 MG tablet Take 1 tablet ever 6 hours as needed for vertigo. (Patient taking differently: Take 12.5 mg by mouth 3 (three) times daily as needed for dizziness or nausea.) 30 tablet 0 01/20/2022  ? Multiple Vitamin (MULTIVITAMIN WITH MINERALS) TABS tablet Take 1 tablet by mouth daily.   Past Week  ? ondansetron (ZOFRAN) 4 MG tablet Take 1 tablet every 8 hours as needed for nausea/vomiting (Patient taking differently: Take 4 mg by mouth every 8 (eight) hours as needed for nausea or vomiting.) 20 tablet 0 01/19/2022  ? traMADol (ULTRAM) 50 MG tablet TAKE 1-2 TABLETS EVERY 6 HOURS AS NEEDED FOR PAIN (Patient taking differently: Take 50 mg by mouth every 6 (six) hours as needed for moderate pain or severe pain.) 180 tablet 0 01/19/2022  ? VENTOLIN HFA 108 (90 Base) MCG/ACT inhaler USE 2 PUFFS EVERY 6 HOURS AS NEEDED (Patient taking differently: 2 puffs every 6 (six) hours as needed for wheezing or shortness of breath.) 18 g 1 Past Month  ? vitamin B-12 (CYANOCOBALAMIN) 1000 MCG tablet Take 1,000 mcg by mouth daily.   Past Week  ? diclofenac Sodium (VOLTAREN) 1 % GEL Apply 4 g topically 4 (four) times daily. (Patient not taking: Reported on 01/20/2022) 100 g 1 Not Taking  ? doxycycline (VIBRA-TABS) 100 MG tablet Take 1 tablet (100 mg total) by mouth 2 (two) times daily. (Patient not taking: Reported on 01/20/2022) 14 tablet 0 Not Taking  ?  ? ? ?Current Facility-Administered Medications:  ?   stroke: early stages of recovery book, , Does not apply, Once, Derek Jack, MD ?  acetaminophen (TYLENOL) tablet 650 mg, 650 mg, Oral, Q4H PRN **OR** acetaminophen (TYLENOL) 160 MG/5ML solution 650 mg, 650 mg, Per Tube, Q4H PRN **OR** acetaminophen (TYLENOL) suppository 650 mg,  650 mg, Rectal, Q4H PRN, Derek Jack, MD ?  clevidipine (CLEVIPREX) infusion 0.5 mg/mL, 0-21 mg/hr, Intravenous, Continuous, Derek Jack, MD ?  meclizine (ANTIVERT) tablet 12.5 mg, 12.5 mg, Oral, TID PRN, Derek Jack, MD ?  ondansetron [facility]) tablet 4 mg, 4 mg, Oral, Q8H PRN, Derek Jack, MD ?  pantoprazole (PROTONIX) injection 40 mg, 40 mg, Intravenous, QHS, Derek Jack, MD ?  senna-docusate (Senokot-S) tablet 1 tablet, 1 tablet, Oral, BID, Su Monks M,  MD, 1 tablet at 01/20/22 1617 ? ?Vitals  ? ?Vitals:  ? 01/20/22 1530 01/20/22 1600 01/20/22 1630 01/20/22 1652  ?BP: 138/77 (!) 142/80 116/86   ?Pulse: 81 88 93   ?Resp: 15 18 (!) 21   ?Temp:    98 ?F (36.7 ?C)  ?TempSrc:    Oral  ?SpO2: 94% 98% 98%   ?Weight:      ?Height:      ?  ? ?Body mass index is 22.79 kg/m?. ? ?Physical Exam  ? ?Physical Exam ?Gen: A&O x4, NAD ?HEENT: Atraumatic, normocephalic;mucous membranes moist; oropharynx clear, tongue without atrophy or fasciculations. ?Neck: Supple, trachea midline. ?Resp: CTAB, no w/r/r ?CV: RRR, no m/g/r; nml S1 and S2. 2+ symmetric peripheral pulses. ?Abd: soft/NT/ND; nabs x 4 quad ?Extrem: Nml bulk; no cyanosis, clubbing, or edema. ? ?Neuro: ?*MS: A&O x4. Follows multi-step commands.  ?*Speech: fluid, nondysarthric, able to name and repeat ?*CN:  ?  I: Deferred ?  II,III: PERRLA, VFF by confrontation, optic discs unable to be visualized 2/2 pupillary constriction ?  III,IV,VI: EOMI w/o nystagmus, no ptosis ?  V: Sensation intact from V1 to V3 to LT ?  VII: Eyelid closure was full.  Smile symmetric. ?  VIII: Hearing intact to voice ?  IX,X: Voice normal, palate elevates symmetrically  ?  XI: SCM/trap 5/5 bilat   ?XII: Tongue protrudes midline, no atrophy or fasciculations  ? ?*Motor:   Normal bulk.  No tremor, rigidity or bradykinesia. No pronator drift. ? ?  Strength: Dlt Bic Tri WrE WrF FgS Gr HF KnF KnE PlF DoF  ?  Left '5 5 5 5 5 5 5 5 5 5 5 5  '$ ?  Right '5 5 5 5 5 5 5 5 5 5 5 5   '$ ? ? ?*Sensory: Intact to light touch, pinprick, temperature vibration throughout. Symmetric. Propioception intact bilat.  No double-simultaneous extinction.  ?*Coordination:  no frank ataxia on FNF ?*

## 2022-01-20 NOTE — Telephone Encounter (Signed)
Triage nurse call and stated pt want some Zosran call in for nausea because she ran out of it that was given to her a the hospital over the weekend pt stated she didn't want to come in and refuse to go back to the hospital per Triage nurse. ?

## 2022-01-20 NOTE — Telephone Encounter (Signed)
Rx sent 

## 2022-01-20 NOTE — ED Triage Notes (Addendum)
Pt arrived via [facility] from home for c/o vertigo/dizzinessx10d. Pt states she has a hx of same. Pt states was seen at [facility] on Fri and Sunday for same and tx and D/C'd, but it never went away. Pt is A&Ox4. VSS. ?

## 2022-01-20 NOTE — Telephone Encounter (Signed)
See prior telephone encounter.

## 2022-01-20 NOTE — Consult Note (Deleted)
NEUROLOGY H&P   Date of service: Jan 20, 2022 Patient Name: Molly Patterson MRN:  [id number] DOB:  October 30, 1931 Reason for admission: R cerebellar ICH Requesting physician: Dr. 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  Approximately 10 days ago she developed severe dizziness/room spinning with nausea and vomiting and symptoms have persisted since that time.  She presented to [facility] on 2 separate occasions within the past week on April 30 and May 5.  We do not have actual images available from [facility] but a head CT on that first ED visit was interpreted as no acute intracranial process.  She did not have MRI imaging.  CT head performed at [facility] today shows a small right cerebellar intraparenchymal hemorrhage.  Patient states that she was told that she had some kind of venous anomaly which did bleed in the 1980s or 90s but she did not have to have surgery at that time and does not have any further specific information about this.  Her exam is nonfocal although she has persistent dizziness and nausea.  He has not been able to really eat or drink for the past several days due to nausea.  She is not on anticoagulation.  No history of trauma.  Imaging in ED:  CTA showed no LVO, aneurysm, or vascular malformation. Severe R P2 stenosis CT head wo showed new small acute IPH R cerebellum with ?surrounding hypodensity c/f subacute ischemic infarct  CNS imaging personally reviewed.    ROS   Per HPI: all other systems reviewed and are negative  Past History   I have reviewed the following:  Past Medical History:  Diagnosis Date   Allergy    hayfever   ANEMIA-IRON DEFICIENCY 08/02/2009   BURSITIS, HIP 08/02/2009   COVID-19    HYPERTENSION 08/02/2009   OSTEOARTHRITIS, GENERALIZED 08/12/2010   Raynaud's syndrome 08/12/2010    Transfusion history 09/15/1962   Past Surgical History:  Procedure Laterality Date   APPENDECTOMY     TONSILLECTOMY     Family History  Problem Relation Age of Onset   Arthritis Neg Hx        family   Hypertension Neg Hx        family   Diabetes Neg Hx        family   Prostate cancer Neg Hx        family   Social History   Socioeconomic History   Marital status: Married    Spouse name: Not on file   Number of children: Not on file   Years of education: Not on file   Highest education level: Not on file  Occupational History   Not on file  Tobacco Use   Smoking status: Never   Smokeless tobacco: Never  Vaping Use   Vaping Use: Never used  Substance and Sexual Activity   Alcohol use: No   Drug use: Never   Sexual activity: Not on file  Other Topics Concern   Not on file  Social History Narrative   Not on file   Social Determinants of Health   Financial Resource Strain: Not on file  Food Insecurity: Not on file  Transportation Needs: Not on file  Physical Activity: Not on file  Stress: Not on file  Social Connections: Not on file   Allergies  Allergen Reactions   Penicillins Shortness Of Breath and Swelling    REACTION: hives   Lidocaine-Epinephrine     REACTION: pulse rapic   Rofecoxib     Vioxx, itiching    Medications   Medications Prior to Admission  Medication Sig Dispense Refill Last Dose   amLODipine (NORVASC) 10 MG tablet TAKE ONE (1) TABLET EACH DAY (Patient taking differently: Take 10 mg by mouth daily.) 90 tablet 0 01/19/2022   azelastine (ASTELIN) 0.1 % nasal spray Place 1 spray into both nostrils 2 (two) times daily. Use in each nostril as directed (Patient taking differently: Place 1 spray into both nostrils daily as needed for rhinitis or allergies.) 30 mL 12 12/05/2021   benazepril (LOTENSIN) 10 MG tablet TAKE ONE (1) TABLET EACH DAY (Patient taking differently: Take 10 mg by mouth daily.) 90 tablet 0 01/19/2022   cholecalciferol (VITAMIN  D3) 25 MCG (1000 UNIT) tablet Take 1,000 Units by mouth daily.   Past Week   hydrochlorothiazide (MICROZIDE) 12.5 MG capsule TAKE ONE (1) CAPSULE EACH DAY (Patient taking differently: Take 12.5 mg by mouth daily.) 90 capsule 3 01/18/2022   meclizine (ANTIVERT) 12.5 MG tablet Take 1 tablet ever 6 hours as needed for vertigo. (Patient taking differently: Take 12.5 mg by mouth 3 (three) times daily as needed for dizziness or nausea.) 30 tablet 0 01/20/2022   Multiple Vitamin (MULTIVITAMIN WITH MINERALS) TABS tablet Take 1 tablet by mouth daily.   Past Week   ondansetron (ZOFRAN) 4 MG tablet Take 1 tablet every 8 hours as needed for nausea/vomiting (Patient taking differently: Take 4 mg by mouth every 8 (eight) hours as needed for nausea or vomiting.) 20 tablet 0 01/19/2022   traMADol (ULTRAM) 50 MG tablet TAKE 1-2 TABLETS EVERY 6 HOURS AS NEEDED FOR PAIN (Patient taking differently: Take 50 mg by mouth every 6 (six) hours as needed for moderate pain or severe pain.) 180 tablet 0 01/19/2022   VENTOLIN HFA 108 (90 Base) MCG/ACT inhaler USE 2 PUFFS EVERY 6 HOURS AS NEEDED (Patient taking differently: 2 puffs every 6 (six) hours as needed for wheezing or shortness of breath.) 18 g 1 Past Month   vitamin B-12 (CYANOCOBALAMIN) 1000 MCG tablet Take 1,000 mcg by mouth daily.   Past Week   diclofenac Sodium (VOLTAREN) 1 % GEL Apply 4 g topically 4 (four) times daily. (Patient not taking: Reported on 01/20/2022) 100 g 1 Not Taking   doxycycline (VIBRA-TABS) 100 MG tablet Take 1 tablet (100 mg total) by mouth 2 (two) times daily. (Patient not taking: Reported on 01/20/2022) 14 tablet 0 Not Taking      Current Facility-Administered Medications:     stroke: early stages of recovery book, , Does not apply, Once, Jefferson Fuel, MD   acetaminophen (TYLENOL) tablet 650 mg, 650 mg, Oral, Q4H PRN **OR** acetaminophen (TYLENOL) 160 MG/5ML solution 650 mg, 650 mg, Per Tube, Q4H PRN **OR** acetaminophen (TYLENOL) suppository 650 mg,  650 mg, Rectal, Q4H PRN, Jefferson Fuel, MD   clevidipine (CLEVIPREX) infusion 0.5 mg/mL, 0-21 mg/hr, Intravenous, Continuous, Jefferson Fuel, MD   meclizine (ANTIVERT) tablet 12.5 mg, 12.5 mg, Oral, TID PRN, Jefferson Fuel, MD   ondansetron [facility]) tablet 4 mg, 4 mg, Oral, Q8H PRN, Jefferson Fuel, MD   pantoprazole (PROTONIX) injection 40 mg, 40 mg, Intravenous, QHS, Jefferson Fuel, MD   senna-docusate (Senokot-S) tablet 1 tablet, 1 tablet, Oral, BID, Bing Neighbors M,  MD, 1 tablet at 01/20/22 1617  Vitals   Vitals:   01/20/22 1530 01/20/22 1600 01/20/22 1630 01/20/22 1652  BP: 138/77 (!) 142/80 116/86   Pulse: 81 88 93   Resp: 15 18 (!) 21   Temp:    98 F (36.7 C)  TempSrc:    Oral  SpO2: 94% 98% 98%   Weight:      Height:         Body mass index is 22.79 kg/m.  Physical Exam   Physical Exam Gen: A&O x4, NAD HEENT: Atraumatic, normocephalic;mucous membranes moist; oropharynx clear, tongue without atrophy or fasciculations. Neck: Supple, trachea midline. Resp: CTAB, no w/r/r CV: RRR, no m/g/r; nml S1 and S2. 2+ symmetric peripheral pulses. Abd: soft/NT/ND; nabs x 4 quad Extrem: Nml bulk; no cyanosis, clubbing, or edema.  Neuro: *MS: A&O x4. Follows multi-step commands.  *Speech: fluid, nondysarthric, able to name and repeat *CN:    I: Deferred   II,III: PERRLA, VFF by confrontation, optic discs unable to be visualized 2/2 pupillary constriction   III,IV,VI: EOMI w/o nystagmus, no ptosis   V: Sensation intact from V1 to V3 to LT   VII: Eyelid closure was full.  Smile symmetric.   VIII: Hearing intact to voice   IX,X: Voice normal, palate elevates symmetrically    XI: SCM/trap 5/5 bilat   XII: Tongue protrudes midline, no atrophy or fasciculations   *Motor:   Normal bulk.  No tremor, rigidity or bradykinesia. No pronator drift.    Strength: Dlt Bic Tri WrE WrF FgS Gr HF KnF KnE PlF DoF    Left 5 5 5 5 5 5 5 5 5 5 5 5     Right 5 5 5 5 5 5 5 5 5 5 5 5      *Sensory: Intact to light touch, pinprick, temperature vibration throughout. Symmetric. Propioception intact bilat.  No double-simultaneous extinction.  *Coordination:  no frank ataxia on FNF *Reflexes:  2+ and symmetric throughout without clonus; toes down-going bilat *Gait: deferred  NIHSS = 0   Premorbid mRS = 2   Labs   CBC:  Recent Labs  Lab 01/20/22 1359  WBC 9.6  NEUTROABS 7.6  HGB 15.9*  HCT 49.1*  MCV 89.8  PLT 311    Basic Metabolic Panel:  Lab Results  Component Value Date   NA 138 01/20/2022   K 4.1 01/20/2022   CO2 27 01/20/2022   GLUCOSE 119 (H) 01/20/2022   BUN 9 01/20/2022   CREATININE 0.62 01/20/2022   CALCIUM 9.2 01/20/2022   GFRNONAA >60 01/20/2022   Lipid Panel: No results found for: LDLCALC HgbA1c: No results found for: HGBA1C Urine Drug Screen: No results found for: LABOPIA, COCAINSCRNUR, LABBENZ, AMPHETMU, THCU, LABBARB  Alcohol Level No results found for: [facility]   Impression   88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 Based on CT head appearance I suspect this may have been an ischemic infarct initially that underwent hemorrhagic conversion. She does have hx some type of vascular malformation unspecified per patient but CTA did not show any abnl except severe R P2 stenosis.  Recommendations   - Admit to ICU under Dr. Selina Cooley - Clevidipine for goal SBP <150. Patient has multiple antihypertensives on her medication list but states she was only taking the amlodipine the last few days and may have thrown that up. He has  not taken any today. Since she is currently at goal SBP in 130s without taking most or all of her home antihypertensives and she will likely need fluids, will hold home antihypertensives for now and manage BP with labetalol IV or clevidipine gtt as needed. - Not on anticoagulation,  nothing to reverse - Platelets are normal - SCDs for DVT prophylaxis - Head CT q 6 hrs assess stability of ICH - MRI brain - TTE - LDL, A1c - NPO until passes swallow study - HOB elevated 30 degrees - PT/OT when able to participate - NSU following   Stroke team will assume care in AM.    This patient is critically ill and at significant risk of neurological worsening, death and care requires constant monitoring of vital signs, hemodynamics,respiratory and cardiac monitoring, neurological assessment, discussion with family, other specialists and medical decision making of high complexity. I spent 110 minutes of neurocritical care time  in the care of  this patient. This was time spent independent of any time provided by nurse practitioner or PA.   Bing Neighbors, MD [facility] [phone]   If 7pm- 7am, please page neurology on call as listed in AMION.

## 2022-01-21 ENCOUNTER — Inpatient Hospital Stay (HOSPITAL_COMMUNITY): Payer: Medicare Other

## 2022-01-21 DIAGNOSIS — I614 Nontraumatic intracerebral hemorrhage in cerebellum: Secondary | ICD-10-CM | POA: Diagnosis not present

## 2022-01-21 MED ORDER — BENAZEPRIL HCL 20 MG PO TABS
10.0000 mg | ORAL_TABLET | Freq: Every day | ORAL | Status: DC
  Administered 2022-01-21 – 2022-01-27 (×7): 10 mg via ORAL
  Filled 2022-01-21: qty 2
  Filled 2022-01-21 (×4): qty 1
  Filled 2022-01-21: qty 2
  Filled 2022-01-21: qty 1

## 2022-01-21 MED ORDER — PANTOPRAZOLE SODIUM 40 MG PO TBEC
40.0000 mg | DELAYED_RELEASE_TABLET | Freq: Every day | ORAL | Status: DC
  Administered 2022-01-21 – 2022-01-26 (×6): 40 mg via ORAL
  Filled 2022-01-21 (×6): qty 1

## 2022-01-21 MED ORDER — HYDROCHLOROTHIAZIDE 12.5 MG PO TABS
12.5000 mg | ORAL_TABLET | Freq: Every day | ORAL | Status: DC
  Administered 2022-01-21 – 2022-01-27 (×7): 12.5 mg via ORAL
  Filled 2022-01-21 (×7): qty 1

## 2022-01-21 MED ORDER — LABETALOL HCL 5 MG/ML IV SOLN: 10.0000 mg | INTRAVENOUS | Status: DC | PRN

## 2022-01-21 MED ORDER — TRAMADOL HCL 50 MG PO TABS: 50.0000 mg | ORAL_TABLET | Freq: Once | ORAL | Status: DC

## 2022-01-21 MED ORDER — TRAMADOL HCL 50 MG PO TABS
50.0000 mg | ORAL_TABLET | Freq: Four times a day (QID) | ORAL | Status: DC | PRN
Start: 2022-01-21 — End: 2022-01-27
  Administered 2022-01-21 – 2022-01-26 (×10): 50 mg via ORAL
  Filled 2022-01-21 (×10): qty 1

## 2022-01-21 MED ORDER — HYDRALAZINE HCL 20 MG/ML IJ SOLN
20.0000 mg | Freq: Four times a day (QID) | INTRAMUSCULAR | Status: DC | PRN
  Filled 2022-01-21: qty 1

## 2022-01-21 MED ORDER — AMLODIPINE BESYLATE 10 MG PO TABS
10.0000 mg | ORAL_TABLET | Freq: Every day | ORAL | Status: DC
  Administered 2022-01-21 – 2022-01-27 (×7): 10 mg via ORAL
  Filled 2022-01-21 (×7): qty 1

## 2022-01-21 MED ORDER — CHLORHEXIDINE GLUCONATE CLOTH 2 % EX PADS
6.0000 | MEDICATED_PAD | Freq: Every day | CUTANEOUS | Status: DC
  Administered 2022-01-21: 6 via TOPICAL

## 2022-01-21 NOTE — Progress Notes (Signed)
PT Cancellation Note ? ?Patient Details ?Name: Molly Patterson ?MRN: [id number] ?DOB: December 29, 1931 ? ? ?Cancelled Treatment:    Reason Eval/Treat Not Completed: Active bedrest order. Will follow as activity advanced.  ? ?Leighton Roach, PT  ?Acute Rehab Services ? Pager [phone] ?Office [phone] ? ? ? ?[place] ?01/21/2022, 8:18 AM ?

## 2022-01-21 NOTE — Progress Notes (Signed)
Inpatient Rehab Admissions Coordinator:  ? ?Per therapy recommendations,  patient was screened for CIR candidacy by Zelma Mazariego, MS, CCC-SLP. At this time, Pt. Appears to be a a potential candidate for CIR. I will place   order for rehab consult per protocol for full assessment. Please contact me any with questions. ? ?Leauna Sharber, MS, CCC-SLP ?Rehab Admissions Coordinator  ?[phone] (celll) ?[phone] (office) ? ?

## 2022-01-21 NOTE — Progress Notes (Addendum)
STROKE TEAM PROGRESS NOTE   INTERVAL HISTORY Patient is seen in her room with no family at the bedside.  She reports that she has had severe dizziness with nausea and vomiting for about ten days and was seen twice in the ED for this prior to this admission.  CT head performed on admission here shows a small right cerebellar IPH with only mild cytotoxic edema and no midline shift or intraventricular extension hydrocephalus.  She has required IV Cleviprex drip for blood pressure control.  Neurological exam is nonfocal  Vitals:   01/21/22 1000 01/21/22 1100 01/21/22 1151 01/21/22 1200  BP: (!) 142/82 131/70  (!) 130/105  Pulse: 95 81 (!) 106 (!) 105  Resp: 20 15 20 16   Temp:    98.8 F (37.1 C)  TempSrc:    Oral  SpO2: 97% 95% 96% 92%  Weight:      Height:       CBC:  Recent Labs  Lab 01/20/22 1359  WBC 9.6  NEUTROABS 7.6  HGB 15.9*  HCT 49.1*  MCV 89.8  PLT 311   Basic Metabolic Panel:  Recent Labs  Lab 01/20/22 1359  NA 138  K 4.1  CL 102  CO2 27  GLUCOSE 119*  BUN 9  CREATININE 0.62  CALCIUM 9.2   Lipid Panel: No results for input(s): CHOL, TRIG, HDL, CHOLHDL, VLDL, LDLCALC in the last 168 hours. HgbA1c: No results for input(s): HGBA1C in the last 168 hours. Urine Drug Screen: No results for input(s): LABOPIA, COCAINSCRNUR, LABBENZ, AMPHETMU, THCU, LABBARB in the last 168 hours.  Alcohol Level No results for input(s): ETH in the last 168 hours.  IMAGING past 24 hours CT ANGIO HEAD NECK W WO CM  Result Date: 01/20/2022 CLINICAL DATA:  Neuro deficit, acute, stroke suspected. Acute onset of dizziness. EXAM: CT ANGIOGRAPHY HEAD AND NECK TECHNIQUE: Multidetector CT imaging of the head and neck was performed using the standard protocol during bolus administration of intravenous contrast. Multiplanar CT image reconstructions and MIPs were obtained to evaluate the vascular anatomy. Carotid stenosis measurements (when applicable) are obtained utilizing NASCET criteria, using  the distal internal carotid diameter as the denominator. RADIATION DOSE REDUCTION: This exam was performed according to the departmental dose-optimization program which includes automated exposure control, adjustment of the mA and/or kV according to patient size and/or use of iterative reconstruction technique. CONTRAST:  75mL OMNIPAQUE IOHEXOL 350 MG/ML SOLN COMPARISON:  None Available. FINDINGS: CTA NECK FINDINGS Aortic arch: Standard 3 vessel aortic arch with a moderate amount of atherosclerotic plaque. Patent brachiocephalic and subclavian arteries without evidence of a significant stenosis. Right carotid system: Patent with minimal scattered atherosclerotic plaque. No evidence of a dissection or stenosis. Left carotid system: Patent with minimal scattered atherosclerotic plaque. No evidence of a dissection or stenosis. Partially retropharyngeal course of the mid cervical ICA. Vertebral arteries: Patent with scattered atherosclerotic plaque involving the left greater than right vertebral arteries. No evidence of a significant stenosis or dissection. Mildly dominant left vertebral artery. Skeleton: Advanced disc and facet degeneration in the cervical spine. Interbody and facet ankylosis at C2-3. Other neck: No evidence of cervical lymphadenopathy or mass. Upper chest: Mild biapical pleuroparenchymal lung scarring. Minimal dependent atelectasis bilaterally. Review of the MIP images confirms the above findings CTA HEAD FINDINGS Anterior circulation: The internal carotid arteries are patent from skull base to carotid termini with a small to moderate amount of calcified plaque bilaterally not resulting in significant stenosis. ACAs and MCAs are patent with mild atherosclerotic  irregularity but no evidence of a proximal branch occlusion or flow limiting proximal stenosis. No aneurysm or vascular malformation is identified. Posterior circulation: The intracranial vertebral arteries are widely patent to the basilar.  Patent PICA, AICA, and SCA origins are seen bilaterally. The basilar artery is widely patent. Posterior communicating arteries are diminutive or absent. Both PCAs are patent without evidence of a significant proximal stenosis on the left. There is a severe mid to distal right P2 stenosis. No aneurysm or vascular malformation is identified. Venous sinuses: As permitted by contrast timing, patent. Anatomic variants: None. Review of the MIP images confirms the above findings IMPRESSION: 1. No large vessel occlusion, aneurysm, or vascular malformation. 2. Intracranial atherosclerosis with a severe right P2 stenosis. 3. Widely patent cervical carotid and vertebral arteries. 4. Aortic Atherosclerosis (ICD10-I70.0). Electronically Signed   By: Sebastian Ache M.D.   On: 01/20/2022 17:03   CT Head Wo Contrast  Result Date: 01/20/2022 CLINICAL DATA:  Dizziness. EXAM: CT HEAD WITHOUT CONTRAST TECHNIQUE: Contiguous axial images were obtained from the base of the skull through the vertex without intravenous contrast. RADIATION DOSE REDUCTION: This exam was performed according to the departmental dose-optimization program which includes automated exposure control, adjustment of the mA and/or kV according to patient size and/or use of iterative reconstruction technique. COMPARISON:  CT head dated January 12, 2022. FINDINGS: Brain: New small acute intraparenchymal hemorrhage in the right cerebellum (series 3, image 7). No intraventricular extension. No evidence of acute infarction, hydrocephalus, extra-axial collection or mass lesion/mass effect. Stable mild atrophy and chronic microvascular ischemic changes, within normal limits for age. Vascular: Atherosclerotic vascular calcification of the carotid siphons. No hyperdense vessel. Skull: Normal. Negative for fracture or focal lesion. Sinuses/Orbits: No acute finding. Other: None. IMPRESSION: 1. New small acute intraparenchymal hemorrhage in the right cerebellum. Critical  Value/emergent results were called by telephone at the time of interpretation on 01/20/2022 at 2:25 pm to provider [facility], who verbally acknowledged these results. Electronically Signed   By: Obie Dredge M.D.   On: 01/20/2022 14:25   MR BRAIN WO CONTRAST  Result Date: 01/20/2022 CLINICAL DATA:  Initial evaluation for acute dizziness. EXAM: MRI HEAD WITHOUT CONTRAST TECHNIQUE: Multiplanar, multiecho pulse sequences of the brain and surrounding structures were obtained without intravenous contrast. COMPARISON:  Prior CT from earlier the same day. FINDINGS: Brain: Cerebral volume within normal limits for age. Mild chronic microvascular ischemic disease noted involving the periventricular and deep white matter both cerebral hemispheres. Previously identified acute intraparenchymal hemorrhage involving the right cerebellum again seen, grossly stable in size and morphology as compared to previous. No visible underlying lesion on this noncontrast examination. No significant regional mass effect or midline shift. Adjacent fourth ventricle remains widely patent. No other evidence for acute or chronic intracranial hemorrhage. No other acute large vessel territory infarct. No mass lesion or midline shift. No hydrocephalus or extra-axial fluid collection. Pituitary gland and suprasellar region within normal limits. Midline structures intact. Vascular: Major intracranial vascular flow voids are maintained. Skull and upper cervical spine: Craniocervical junction within normal limits. Bone marrow signal intensity nor heterogeneous but overall within normal limits. No scalp soft tissue abnormality. Sinuses/Orbits: Globes and orbital soft tissues within normal limits. Paranasal sinuses and mastoid air cells are largely clear. Other: None. IMPRESSION: 1. Grossly stable size and morphology of acute hemorrhage involving the right cerebellum. No visible underlying lesion on this noncontrast examination. No significant  regional mass effect or midline shift. 2. No other acute intracranial abnormality. 3. Mild chronic  microvascular ischemic disease for age. Electronically Signed   By: Rise Mu M.D.   On: 01/20/2022 22:17    PHYSICAL EXAM General:  Alert, thin-appearing elderly patient in no acute distress Respiratory:  Regular, unlabored respirations on room air  NEURO:  Mental Status: AA&Ox3  Speech/Language: speech is without dysarthria or aphasia.  Fluency, and comprehension intact.  Cranial Nerves:  II: PERRL. Visual fields full.  III, IV, VI: EOMI with left sided dysmetric saccades. Eyelids elevate symmetrically.  V: Sensation is intact to light touch and symmetrical to face.  VII: Smile is symmetrical.  VIII: hearing intact to voice. IX, X: Phonation is normal.  WG:NFAOZHYQ shrug 5/5. XII: tongue is midline without fasciculations. Motor: 5/5 strength to all muscle groups tested.  Sensation- Intact to light touch bilaterally.  Coordination: FTN intact bilaterally, HKS: no ataxia in BLE.No drift.  Gait- deferred   ASSESSMENT/PLAN Molly Patterson is a 88 y.o. female with history of HTN and osteoarthritis presenting with  severe dizziness with nausea and vomiting for about ten days for which she was seen twice in the ED prior to this admission.  CT head performed on admission here shows a small right cerebellar IPH.  IPH:  right cerebellar IPH in setting of uncontrolled hypertension CT head Small acute IPH in right cerebellum CTA head & neck No LVO, aneurysm or vascular malformation, severe right P2 stenosis MRI  stable right cerebellar IPH, mild chronic microvascular disease 2D Echo pending LDL No results found for requested labs within last 65784 hours. HgbA1c No results found for requested labs within last 69629 hours. VTE prophylaxis - SCDs    Diet   Diet Heart Room service appropriate? Yes; Fluid consistency: Thin   No antithrombotic prior to admission, now on No  antithrombotic secondary to IPH Therapy recommendations:  CIR Disposition:  pending  Hypertension Home meds:  HCTZ 12.5 mg daily Stable, no longer requiring Cleviprex PRN labetalol and hydralazine Keep SBP 130-150 for first 24 hours, then <160 Long-term BP goal normotensive  Hyperlipidemia Home meds:  none LDL No results found for requested labs within last 52841 hours., goal < 70 Add statin at discharge if indicated  Continue statin at discharge  Risk for Diabetes type II  Home meds:  none HgbA1c pending., goal < 7.0 CBGs No results for input(s): GLUCAP in the last 72 hours.  SSI  Other Stroke Risk Factors Advanced Age >/= 17   Other Active Problems none  Hospital day # 1  Molly Patterson , MSN, AGACNP-BC [facility] See Amion for schedule and pager information 01/21/2022 2:11 PM   STROKE MD NOTE :   I have personally obtained history,examined this patient, reviewed notes, independently viewed imaging studies, participated in medical decision making and plan of care.ROS completed by me personally and pertinent positives fully documented  I have made any additions or clarifications directly to the above note. Agree with note above.  Patient presented with several days of nausea vomiting and was seen in the ER twice in ED with negative brain imaging CT scan yesterday showed a small right cerebellar parenchymal hemorrhage and MRI scan this morning shows stable appearance of the hemorrhage without any underlying structural or vascular lesions.  Neurological exam is fairly nonfocal.  Recommend close neurological observation with strict blood pressure control with systolic goal 130-150 for the first 24 hours and then below 160 thereafter.  Resume home blood pressure medications and use as needed IV labetalol and hydralazine and wean  off Cleviprex drip as tolerated.  Mobilize out of bed and therapy consults.  May need repeat MRI scan with and without contrast in 2 to  3 months rule out any underlying structural or vascular lesion.  No family available at the bedside for discussion.This patient is critically ill and at significant risk of neurological worsening, death and care requires constant monitoring of vital signs, hemodynamics,respiratory and cardiac monitoring, extensive review of multiple databases, frequent neurological assessment, discussion with family, other specialists and medical decision making of high complexity.I have made any additions or clarifications directly to the above note.This critical care time does not reflect procedure time, or teaching time or supervisory time of PA/NP/Med Resident etc but could involve care discussion time.  I spent 35 minutes of neurocritical care time  in the care of  this patient.      Delia Heady, MD Medical Director [facility] Pager: [phone] 01/21/2022 3:44 PM  To contact Stroke Continuity provider, please refer to [email or web address]. After hours, contact General Neurology

## 2022-01-21 NOTE — Progress Notes (Signed)
?  Transition of Care (TOC) Screening Note ? ? ?Patient Details  ?Name: Molly Patterson ?Date of Birth: 08/28/1932 ? ? ?Transition of Care (TOC) CM/SW Contact:    ?Benard Halsted, LCSW ?Phone Number: ?01/21/2022, 9:22 AM ? ? ? ?Transition of Care Department [facility]) has reviewed patient and no TOC needs have been identified at this time. We will continue to monitor patient advancement through interdisciplinary progression rounds. If new patient transition needs arise, please place a TOC consult. ? ? ?

## 2022-01-21 NOTE — Progress Notes (Signed)
OT Cancellation Note ? ?Patient Details ?Name: Molly Patterson ?MRN: [id number] ?DOB: 11-28-1931 ? ? ?Cancelled Treatment:    Reason Eval/Treat Not Completed: Active bedrest order ? ?Molly Patterson ?01/21/2022, 7:33 AM ?

## 2022-01-21 NOTE — Evaluation (Signed)
Physical Therapy Evaluation ?Patient Details ?Name: Molly Patterson ?MRN: [id number] ?DOB: 30-Apr-1932 ?Today's Date: 01/21/2022 ? ?History of Present Illness ? Pt is 88 yo female who has been having dizziness and nausea with vomiting. She presented to [facility] 2x with first CT neg. CT at [facility] showed small acute IPH R cerebellum. PMH: HTN, OA, Raynaud's.  ?Clinical Impression ? Pt admitted with above diagnosis. Pt from home with husband where she was independent and driving. Eval limited by pt anxiety of becoming dizzy again as episodes yesterday were so bad for her. Pt had been premedicated and with slow mvmts with gaze stabilization pt was able to come to EOB with mod A +2 and avoidance of rolling. Pt with generalized weakness from past week of minimal OOB mobility. Pt required mod A +2 to transfer to recliner with posterior lean and very narrow BOS. Have concerns for LE ataxia with gait progression but did not attempt today with her anxiety. At this time recommending AIR to return to independence.  Pt currently with functional limitations due to the deficits listed below (see PT Problem List). Pt will benefit from skilled PT to increase their independence and safety with mobility to allow discharge to the venue listed below.   ?   ?   ? ?Recommendations for follow up therapy are one component of a multi-disciplinary discharge planning process, led by the attending physician.  Recommendations may be updated based on patient status, additional functional criteria and insurance authorization. ? ?Follow Up Recommendations Acute inpatient rehab (3hours/day) ? ?  ?Assistance Recommended at Discharge Frequent or constant Supervision/Assistance  ?Patient can return home with the following ? A lot of help with walking and/or transfers;A lot of help with bathing/dressing/bathroom;Assistance with cooking/housework;Assist for transportation;Help with stairs or ramp for entrance ? ?  ?Equipment Recommendations Other  (comment) (TBD)  ?Recommendations for Other Services ? Rehab consult  ?  ?Functional Status Assessment Patient has had a recent decline in their functional status and demonstrates the ability to make significant improvements in function in a reasonable and predictable amount of time.  ? ?  ?Precautions / Restrictions Precautions ?Precautions: Fall ?Precaution Comments: dizzy +nausea ?Restrictions ?Weight Bearing Restrictions: No  ? ?  ? ?Mobility ? Bed Mobility ?Overal bed mobility: Needs Assistance ?Bed Mobility: Supine to Sit ?  ?  ?Supine to sit: Mod assist, +2 for physical assistance ?  ?  ?General bed mobility comments: cues for gaze stabilization. avoided rolling ?  ? ?Transfers ?Overall transfer level: Needs assistance ?Equipment used: 2 person hand held assist ?Transfers: Sit to/from Stand, Bed to chair/wheelchair/BSC ?Sit to Stand: Mod assist, +2 safety/equipment ?  ?Step pivot transfers: Mod assist, +2 safety/equipment ?  ?  ?  ?General transfer comment: cues for gaze stabilization + incr time ?  ? ?Ambulation/Gait ?  ?  ?  ?  ?  ?  ?  ?General Gait Details: did not progress ambulation today due to pt anxiety at getting up at all and fear of falling because of the vertigo sensations that she had yesterday. Did march in place at edge of chair and pt with poorly coordinated stepping and posterior lean, needing mod A +2 for wt shift and stability ? ?Stairs ?  ?  ?  ?  ?  ? ?Wheelchair Mobility ?  ? ?Modified Rankin (Stroke Patients Only) ?Modified Rankin (Stroke Patients Only) ?Pre-Morbid Rankin Score: No symptoms ?Modified Rankin: Moderately severe disability ? ?  ? ?Balance Overall balance assessment: Needs assistance ?Sitting-balance  support: Feet supported ?Sitting balance-Leahy Scale: Poor ?Sitting balance - Comments: initally needed mod A due to posterior lean, progressed to close min guard ?Postural control: Posterior lean ?Standing balance support: Bilateral upper extremity supported, During  functional activity ?Standing balance-Leahy Scale: Poor ?Standing balance comment: posterior lean ?  ?  ?  ?  ?  ?  ?  ?  ?  ?  ?  ?   ? ? ? ?Pertinent Vitals/Pain Pain Assessment ?Pain Assessment: Faces ?Faces Pain Scale: No hurt ?Pain Intervention(s): Monitored during session  ? ? ?Home Living Family/patient expects to be discharged to:: Private residence ?Living Arrangements: Spouse/significant other ?Available Help at Discharge: Family ?Type of Home: House ?Home Access: Stairs to enter ?  ?Entrance Stairs-Number of Steps: 7 ?Alternate Level Stairs-Number of Steps: 7 ?Home Layout: Multi-level ?Home Equipment: Grab bars - toilet;Grab bars - tub/shower;Hand held shower head ?   ?  ?Prior Function Prior Level of Function : Independent/Modified Independent;Driving ?  ?  ?  ?  ?  ?  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  ? ? ?Hand Dominance  ? Dominant Hand: Right ? ?  ?Extremity/Trunk Assessment  ? Upper Extremity Assessment ?Upper Extremity Assessment: Defer to OT evaluation ?  ? ?Lower Extremity Assessment ?Lower Extremity Assessment: Generalized weakness ?  ? ?Cervical / Trunk Assessment ?Cervical / Trunk Assessment: Normal  ?Communication  ? Communication: HOH  ?Cognition Arousal/Alertness: Awake/alert ?Behavior During Therapy: Anxious, WFL for tasks assessed/performed ?Overall Cognitive Status: Within Functional Limits for tasks assessed ?  ?  ?  ?  ?  ?  ?  ?  ?  ?  ?  ?  ?  ?  ?  ?  ?General Comments: anxious initially due to fear of mobility exacerbating dizziness and nausea - overall WFL ?  ?  ? ?  ?General Comments General comments (skin integrity, edema, etc.): VSS. No dizziness on eval and once OOB pt very relieved to have been able to mobilize ? ?  ?Exercises    ? ?Assessment/Plan  ?  ?PT Assessment Patient needs continued PT services  ?PT Problem List Decreased strength;Decreased activity tolerance;Decreased balance;Decreased mobility;Decreased  coordination;Decreased knowledge of use of DME;Decreased knowledge of precautions ? ?   ?  ?PT Treatment Interventions DME instruction;Gait training;Stair training;Functional mobility training;Therapeutic activities;Therapeutic exercise;Balance training;Neuromuscular re-education;Patient/family education   ? ?PT Goals (Current goals can be found in the Care Plan section)  ?Acute Rehab PT Goals ?Patient Stated Goal: no dizziness, return home ?PT Goal Formulation: With patient ?Time For Goal Achievement: 02/04/22 ?Potential to Achieve Goals: Good ? ?  ?Frequency Min 4X/week ?  ? ? ?Co-evaluation PT/OT/SLP Co-Evaluation/Treatment: Yes ?Reason for Co-Treatment: Complexity of the patient's impairments (multi-system involvement);Necessary to address cognition/behavior during functional activity ?  ?OT goals addressed during session: ADL's and self-care ?  ? ? ?  ?AM-PAC PT "6 Clicks" Mobility  ?Outcome Measure Help needed turning from your back to your side while in a flat bed without using bedrails?: Total ?Help needed moving from lying on your back to sitting on the side of a flat bed without using bedrails?: Total ?Help needed moving to and from a bed to a chair (including a wheelchair)?: Total ?Help needed standing up from a chair using your arms (e.g., wheelchair or bedside chair)?: Total ?Help needed to walk in hospital room?: Total ?Help needed climbing 3-5 steps with a railing? : Total ?6 Click Score: 6 ? ?  ?  End of Session   ?Activity Tolerance: Patient tolerated treatment well ?Patient left: in chair;with call bell/phone within reach;with chair alarm set ?Nurse Communication: Mobility status ?PT Visit Diagnosis: Unsteadiness on feet (R26.81);Difficulty in walking, not elsewhere classified (R26.2);Dizziness and giddiness (R42) ?  ? ?Time: 7998-7215 ?PT Time Calculation (min) (ACUTE ONLY): 33 min ? ? ?Charges:   PT Evaluation ?$PT Eval Moderate Complexity: 1 Mod ?  ?  ?   ? ? ?Leighton Roach, PT  ?Acute Rehab  Services ? Pager [phone] ?Office [phone] ? ? ?[place] ?01/21/2022, 1:53 PM ? ?

## 2022-01-21 NOTE — Progress Notes (Signed)
Echo attempted. Patient requested that echo be attempted later. ?

## 2022-01-21 NOTE — Evaluation (Signed)
Speech Language Pathology Evaluation ?Patient Details ?Name: Molly Patterson ?MRN: [id number] ?DOB: 27-Apr-1932 ?Today's Date: 01/21/2022 ?Time: 2122-4825 ?SLP Time Calculation (min) (ACUTE ONLY): 23 min ? ?Problem List:  ?Patient Active Problem List  ? Diagnosis Date Noted  ? ICH (intracerebral hemorrhage) ([place]) 01/20/2022  ? RAYNAUD'S SYNDROME 08/12/2010  ? Osteoarthritis, multiple sites 08/12/2010  ? ANEMIA-IRON DEFICIENCY 08/02/2009  ? Essential hypertension 08/02/2009  ? BURSITIS, HIP 08/02/2009  ? ?Past Medical History:  ?Past Medical History:  ?Diagnosis Date  ? Allergy   ? hayfever  ? ANEMIA-IRON DEFICIENCY 08/02/2009  ? BURSITIS, HIP 08/02/2009  ? COVID-19   ? HYPERTENSION 08/02/2009  ? OSTEOARTHRITIS, GENERALIZED 08/12/2010  ? Raynaud's syndrome 08/12/2010  ? Transfusion history 09/15/1962  ? ?Past Surgical History:  ?Past Surgical History:  ?Procedure Laterality Date  ? APPENDECTOMY    ? TONSILLECTOMY    ? 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  ? ?Assessment / Plan / Recommendation ?Clinical Impression ? SLP administered portions of the Cerebellar Cognitive Affective Schmamann Syndrome Scale. Pt somewhat frustrated by some tasks and reported fatgue, but was able to particiapte and complete alternating attention tasks with adequate ability. She did need some extra explanation for the most complex task and also struggled a bit with storing information in short term memory - 3/5 words correct. 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 Likely this pt is at her baseline, perhaps a bit fatigued today. No SLP f/u needed as pt is independent and will have a large support system at home with husband and  children nearby. ?   ?SLP Assessment ? SLP Recommendation/Assessment: Patient does not need any further [place] [facility]  ?  ?Recommendations for follow up therapy are one component of a multi-disciplinary discharge planning process, led by the attending physician.  Recommendations may be updated based on patient status, additional functional criteria and insurance authorization. ?   ?Follow Up Recommendations ? No SLP follow up  ?  ?Assistance Recommended at Discharge ?    ?Functional Status Assessment    ?Frequency and Duration    ?  ?  ?   ?SLP Evaluation ?Cognition ? Overall Cognitive Status: Within Functional Limits for tasks assessed ?Orientation Level: Oriented to person;Oriented to place;Oriented to time;Disoriented to place  ?  ?   ?Comprehension ? Auditory Comprehension ?Overall Auditory Comprehension: Appears within functional limits for tasks assessed  ?  ?Expression Verbal Expression ?Overall Verbal Expression: Appears within functional limits for tasks assessed   ?Oral / Motor ? Oral Motor/Sensory Function ?Overall Oral Motor/Sensory Function: Within functional limits ?Motor Speech ?Overall Motor Speech: Appears within functional limits for tasks assessed   ?        ? ?Braidan Ricciardi, Katherene Ponto ?01/21/2022, 11:24 AM ? ?

## 2022-01-21 NOTE — TOC CAGE-AID Note (Signed)
Transition of Care (TOC) - CAGE-AID Screening ? ? ?Patient Details  ?Name: Molly Patterson ?MRN: [id number] ?Date of Birth: 05/12/1932 ? ?Transition of Care (TOC) CM/SW Contact:    ?Muhammadali Ries C Tarpley-Carter, LCSWA ?Phone Number: ?01/21/2022, 2:35 PM ? ? ?Clinical Narrative: ?Pt participated in [place].  Pt stated she does not use substance or ETOH.  Pt was not offered resources, due to no usage of substance or ETOH.   ? ?Passenger transport manager, MSW, LCSW-A ?Pronouns:  She/Her/Hers ?[facility] ?Clinical Social Worker ?Direct Number:  [phone] ?[email or web address] ? ?CAGE-AID Screening: ?  ? ?Have You Ever Felt You Ought to Cut Down on Your Drinking or Drug Use?: No ?Have People Annoyed You By Critizing Your Drinking Or Drug Use?: No ?Have You Felt Bad Or Guilty About Your Drinking Or Drug Use?: No ?Have You Ever Had a Drink or Used Drugs First Thing In The Morning to Steady Your Nerves or to Get Rid of a Hangover?: No ?CAGE-AID Score: 0 ? ?Substance Abuse Education Offered: No ? ?  ? ? ? ? ? ? ?

## 2022-01-21 NOTE — Evaluation (Signed)
Occupational Therapy Evaluation ?Patient Details ?Name: Molly Patterson ?MRN: [id number] ?DOB: 19-Feb-1932 ?Today's Date: 01/21/2022 ? ? ?History of Present Illness Pt is 88 yo female who has been having dizziness and nausea with vomiting. She presented to [facility] 2x with first CT neg. [facility] showed small acute IPH R cerebellum. PMH: HTN, OA, Raynaud's.  ? ?Clinical Impression ?  ?Molly Patterson was evaluated s/p the above admission list, she is indep at baseline including driving. She lives in a split level home with her husband and other supportive family near by. Pt is a retired Therapist, sports. Upon evaluation pt ws extremely anxious that mobility would exacerbate her dizziness and nausea. +2 given throughout to allow pt to feel more stable. Educated on gaze stabilization, and cues given throughout to maintain strategy. Pt required mod A +2 for bed mobility, standing and to take pivotal steps to the chair. Pt has posterior bias in standing. No report of dizziness or nausea. Due to symptoms, and general weakness from being in the bed, pt requires up to max A for ADLs. She will benefit from OT acutely. Recommend d/c to AIR to progress to indep baseline prior to going home.  ?   ? ?Recommendations for follow up therapy are one component of a multi-disciplinary discharge planning process, led by the attending physician.  Recommendations may be updated based on patient status, additional functional criteria and insurance authorization.  ? ?Follow Up Recommendations ? Acute inpatient rehab (3hours/day)  ?  ?Assistance Recommended at Discharge Frequent or constant Supervision/Assistance  ?Patient can return home with the following A little help with walking and/or transfers ? ?  ?Functional Status Assessment ? Patient has had a recent decline in their functional status and demonstrates the ability to make significant improvements in function in a reasonable and predictable amount of time.  ?Equipment Recommendations ? Other  (comment) (pending pt progression)  ?  ?Recommendations for Other Services Rehab consult ? ? ?  ?Precautions / Restrictions Precautions ?Precautions: Fall ?Precaution Comments: dizzy +nausea ?Restrictions ?Weight Bearing Restrictions: No  ? ?  ? ?Mobility Bed Mobility ?Overal bed mobility: Needs Assistance ?Bed Mobility: Supine to Sit ?  ?  ?Supine to sit: Mod assist, +2 for physical assistance ?  ?  ?General bed mobility comments: cues for gaze stabilization. avoided rolling ?  ? ?Transfers ?Overall transfer level: Needs assistance ?Equipment used: 2 person hand held assist ?Transfers: Sit to/from Stand, Bed to chair/wheelchair/BSC ?Sit to Stand: Mod assist, +2 safety/equipment ?  ?  ?Step pivot transfers: Mod assist, +2 safety/equipment ?  ?  ?General transfer comment: cues for gaze stabilization + incr time ?  ? ?  ?Balance Overall balance assessment: Needs assistance ?Sitting-balance support: Feet supported ?Sitting balance-Leahy Scale: Fair ?  ?  ?Standing balance support: Bilateral upper extremity supported, During functional activity ?Standing balance-Leahy Scale: Poor ?Standing balance comment: posterior lean ?  ?  ?  ?  ?  ?  ?  ?  ?  ?  ?  ?   ? ?ADL either performed or assessed with clinical judgement  ? ?ADL Overall ADL's : Needs assistance/impaired ?Eating/Feeding: Independent;Sitting ?  ?Grooming: Set up;Sitting ?  ?Upper Body Bathing: Minimal assistance;Sitting ?  ?Lower Body Bathing: Maximal assistance;+2 for safety/equipment;Sit to/from stand ?  ?Upper Body Dressing : Set up;Sitting ?  ?Lower Body Dressing: Maximal assistance;+2 for safety/equipment;Sit to/from stand ?  ?Toilet Transfer: Moderate assistance;+2 for safety/equipment;Ambulation ?  ?Toileting- Clothing Manipulation and Hygiene: Min guard;Sitting/lateral lean ?  ?  ?  ?  Functional mobility during ADLs: Moderate assistance;+2 for safety/equipment ?General ADL Comments: significantly increased time required, cues fro gaze stabilization  throughout  ? ? ? ?Vision Baseline Vision/History: 0 No visual deficits ?Ability to See in Adequate Light: 0 Adequate ?Patient Visual Report: No change from baseline ?Vision Assessment?: No apparent visual deficits  ?   ?Perception   ?  ?Praxis   ?  ? ?Pertinent Vitals/Pain Pain Assessment ?Pain Assessment: Faces ?Faces Pain Scale: No hurt ?Pain Intervention(s): Monitored during session  ? ? ? ?Hand Dominance Right ?  ?Extremity/Trunk Assessment Upper Extremity Assessment ?Upper Extremity Assessment: Generalized weakness ?  ?Lower Extremity Assessment ?Lower Extremity Assessment: Defer to PT evaluation ?  ?Cervical / Trunk Assessment ?Cervical / Trunk Assessment: Normal ?  ?Communication Communication ?Communication: HOH ?  ?Cognition Arousal/Alertness: Awake/alert ?Behavior During Therapy: Anxious, WFL for tasks assessed/performed ?Overall Cognitive Status: Within Functional Limits for tasks assessed ?  ?  ?  ?  ?  ?  ?  ?  ?  ?  ?  ?  ?  ?  ?  ?  ?General Comments: anxious initially due to fear of mobility exacerbating dizziness and nausea - overall WFL ?  ?  ?General Comments  VSS on RA, no exacerbation of dizziness this date ? ?  ?Exercises   ?  ?Shoulder Instructions    ? ? ?Home Living Family/patient expects to be discharged to:: Private residence ?Living Arrangements: Spouse/significant other ?Available Help at Discharge: Family ?Type of Home: House ?Home Access: Stairs to enter ?Entrance Stairs-Number of Steps: 7 ?  ?Home Layout: Multi-level ?Alternate Level Stairs-Number of Steps: 7 ?Alternate Level Stairs-Rails: Right;Left ?Bathroom Shower/Tub: Tub/shower unit ?  ?Bathroom Toilet: Standard ?  ?  ?Home Equipment: Grab bars - toilet;Grab bars - tub/shower;Hand held shower head ?  ?  ? Lives With: Spouse ? ?  ?Prior Functioning/Environment Prior Level of Function : Independent/Modified Independent;Driving ?  ?  ?  ?  ?  ?  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   ? ?  ?  ?OT Problem List: Decreased strength;Decreased range of motion;Decreased activity tolerance;Impaired balance (sitting and/or standing) ?  ?   ?OT Treatment/Interventions: Self-care/ADL training;Therapeutic exercise;DME and/or AE instruction;Therapeutic activities;Patient/family education;Balance training  ?  ?OT Goals(Current goals can be found in the care plan section) Acute Rehab OT Goals ?Patient Stated Goal: not to fall ?OT Goal Formulation: With patient ?Time For Goal Achievement: 02/04/22 ?Potential to Achieve Goals: Good ?ADL Goals ?Pt Will Perform Grooming: with supervision;standing ?Pt Will Perform Lower Body Bathing: with supervision;sit to/from stand ?Pt Will Perform Lower Body Dressing: with supervision;sit to/from stand ?Pt Will Transfer to Toilet: with supervision;ambulating ?Pt Will Perform Tub/Shower Transfer: with min guard assist;ambulating ?Additional ADL Goal #1: Pt will indep complete bed mobility as a precursor to OOB ADLs  ?OT Frequency: Min 2X/week ?  ? ?Co-evaluation PT/OT/SLP Co-Evaluation/Treatment: Yes ?Reason for Co-Treatment: For patient/therapist safety;To address functional/ADL transfers ?  ?OT goals addressed during session: ADL's and self-care ?  ? ?  ?AM-PAC OT "6 Clicks" Daily Activity     ?Outcome Measure Help from another person eating meals?: None ?Help from another person taking care of personal grooming?: A Little ?Help from another person toileting, which includes using toliet, bedpan, or urinal?: A Lot ?Help from another person bathing (including washing, rinsing, drying)?: A Lot ?Help from another person to put on and taking off regular upper body clothing?: A Little ?Help from another  person to put on and taking off regular lower body clothing?: A Lot ?6 Click Score: 16 ?  ?End of Session Nurse Communication: Mobility status ? ?Activity Tolerance: Patient tolerated treatment well ?Patient left: in chair;with call bell/phone within reach;with chair alarm set ? ?OT  Visit Diagnosis: Unsteadiness on feet (R26.81);Other abnormalities of gait and mobility (R26.89);Muscle weakness (generalized) (M62.81);Dizziness and giddiness (R42)  ?              ?Time: 1937-9024 ?OT Time Ca

## 2022-01-21 NOTE — Progress Notes (Signed)
Subjective: ?The patient is mildly somnolent, but by report she was up all evening.  She has no complaints. ? ?Objective: ?Vital signs in last 24 hours: ?Temp:  [97.4 ?F (36.3 ?C)-98.4 ?F (36.9 ?C)] 98.3 ?F (36.8 ?C) (05/09 0400) ?Pulse Rate:  [68-95] 72 (05/09 0730) ?Resp:  [9-22] 15 (05/09 0730) ?BP: (116-149)/(64-91) 140/81 (05/09 0730) ?SpO2:  [92 %-98 %] 96 % (05/09 0730) ?Weight:  [57.9 kg] 57.9 kg (05/08 1331) ?Estimated body mass index is 22.79 kg/m? as calculated from the following: ?  Height as of this encounter: 5' 2.75" (1.594 m). ?  Weight as of this encounter: 57.9 kg. ? ? ?Intake/Output from previous day: ?05/08 0701 - 05/09 0700 ?In: 22.4 [I.V.:22.4] ?Out: 450 [Urine:450] ?Intake/Output this shift: ?No intake/output data recorded. ? ?Physical exam the patient is somnolent but easily arousable.  She will answer simple questions.  She is moving all 4 extremities well. ? ?I have reviewed the patient's brain MRI performed last evening.  She has a small right cerebellar hemorrhage without significant mass effect, hydrocephalus, etc. ? ?Lab Results: ?Recent Labs  ?  01/20/22 ?1359  ?WBC 9.6  ?HGB 15.9*  ?HCT 49.1*  ?PLT 311  ? ?BMET ?Recent Labs  ?  01/20/22 ?1359  ?NA 138  ?K 4.1  ?CL 102  ?CO2 27  ?GLUCOSE 119*  ?BUN 9  ?CREATININE 0.62  ?CALCIUM 9.2  ? ? ?Studies/Results: ?CT ANGIO HEAD NECK W WO CM ? ?Result Date: 01/20/2022 ?CLINICAL DATA:  Neuro deficit, acute, stroke suspected. Acute onset of dizziness. EXAM: CT ANGIOGRAPHY HEAD AND NECK TECHNIQUE: Multidetector CT imaging of the head and neck was performed using the standard protocol during bolus administration of intravenous contrast. Multiplanar CT image reconstructions and MIPs were obtained to evaluate the vascular anatomy. Carotid stenosis measurements (when applicable) are obtained utilizing NASCET criteria, using the distal internal carotid diameter as the denominator. RADIATION DOSE REDUCTION: This exam was performed according to the  departmental dose-optimization program which includes automated exposure control, adjustment of the mA and/or kV according to patient size and/or use of iterative reconstruction technique. CONTRAST:  98m OMNIPAQUE IOHEXOL 350 MG/ML SOLN COMPARISON:  None Available. FINDINGS: CTA NECK FINDINGS Aortic arch: Standard 3 vessel aortic arch with a moderate amount of atherosclerotic plaque. Patent brachiocephalic and subclavian arteries without evidence of a significant stenosis. Right carotid system: Patent with minimal scattered atherosclerotic plaque. No evidence of a dissection or stenosis. Left carotid system: Patent with minimal scattered atherosclerotic plaque. No evidence of a dissection or stenosis. Partially retropharyngeal course of the mid cervical ICA. Vertebral arteries: Patent with scattered atherosclerotic plaque involving the left greater than right vertebral arteries. No evidence of a significant stenosis or dissection. Mildly dominant left vertebral artery. Skeleton: Advanced disc and facet degeneration in the cervical spine. Interbody and facet ankylosis at C2-3. Other neck: No evidence of cervical lymphadenopathy or mass. Upper chest: Mild biapical pleuroparenchymal lung scarring. Minimal dependent atelectasis bilaterally. Review of the MIP images confirms the above findings CTA HEAD FINDINGS Anterior circulation: The internal carotid arteries are patent from skull base to carotid termini with a small to moderate amount of calcified plaque bilaterally not resulting in significant stenosis. ACAs and MCAs are patent with mild atherosclerotic irregularity but no evidence of a proximal branch occlusion or flow limiting proximal stenosis. No aneurysm or vascular malformation is identified. Posterior circulation: The intracranial vertebral arteries are widely patent to the basilar. Patent PICA, AICA, and SCA origins are seen bilaterally. The basilar artery is widely  patent. Posterior communicating arteries  are diminutive or absent. Both PCAs are patent without evidence of a significant proximal stenosis on the left. There is a severe mid to distal right P2 stenosis. No aneurysm or vascular malformation is identified. Venous sinuses: As permitted by contrast timing, patent. Anatomic variants: None. Review of the MIP images confirms the above findings IMPRESSION: 1. No large vessel occlusion, aneurysm, or vascular malformation. 2. Intracranial atherosclerosis with a severe right P2 stenosis. 3. Widely patent cervical carotid and vertebral arteries. 4. Aortic Atherosclerosis (ICD10-I70.0). Electronically Signed   By: Logan Bores M.D.   On: 01/20/2022 17:03  ? ?CT Head Wo Contrast ? ?Result Date: 01/20/2022 ?CLINICAL DATA:  Dizziness. EXAM: CT HEAD WITHOUT CONTRAST TECHNIQUE: Contiguous axial images were obtained from the base of the skull through the vertex without intravenous contrast. RADIATION DOSE REDUCTION: This exam was performed according to the departmental dose-optimization program which includes automated exposure control, adjustment of the mA and/or kV according to patient size and/or use of iterative reconstruction technique. COMPARISON:  CT head dated January 12, 2022. FINDINGS: Brain: New small acute intraparenchymal hemorrhage in the right cerebellum (series 3, image 7). No intraventricular extension. No evidence of acute infarction, hydrocephalus, extra-axial collection or mass lesion/mass effect. Stable mild atrophy and chronic microvascular ischemic changes, within normal limits for age. Vascular: Atherosclerotic vascular calcification of the carotid siphons. No hyperdense vessel. Skull: Normal. Negative for fracture or focal lesion. Sinuses/Orbits: No acute finding. Other: None. IMPRESSION: 1. New small acute intraparenchymal hemorrhage in the right cerebellum. Critical Value/emergent results were called by telephone at the time of interpretation on 01/20/2022 at 2:25 pm to provider [facility], who  verbally acknowledged these results. Electronically Signed   By: Titus Dubin M.D.   On: 01/20/2022 14:25  ? ?MR BRAIN WO CONTRAST ? ?Result Date: 01/20/2022 ?CLINICAL DATA:  Initial evaluation for acute dizziness. EXAM: MRI HEAD WITHOUT CONTRAST TECHNIQUE: Multiplanar, multiecho pulse sequences of the brain and surrounding structures were obtained without intravenous contrast. COMPARISON:  Prior CT from earlier the same day. FINDINGS: Brain: Cerebral volume within normal limits for age. Mild chronic microvascular ischemic disease noted involving the periventricular and deep white matter both cerebral hemispheres. Previously identified acute intraparenchymal hemorrhage involving the right cerebellum again seen, grossly stable in size and morphology as compared to previous. No visible underlying lesion on this noncontrast examination. No significant regional mass effect or midline shift. Adjacent fourth ventricle remains widely patent. No other evidence for acute or chronic intracranial hemorrhage. No other acute large vessel territory infarct. No mass lesion or midline shift. No hydrocephalus or extra-axial fluid collection. Pituitary gland and suprasellar region within normal limits. Midline structures intact. Vascular: Major intracranial vascular flow voids are maintained. Skull and upper cervical spine: Craniocervical junction within normal limits. Bone marrow signal intensity nor heterogeneous but overall within normal limits. No scalp soft tissue abnormality. Sinuses/Orbits: Globes and orbital soft tissues within normal limits. Paranasal sinuses and mastoid air cells are largely clear. Other: None. IMPRESSION: 1. Grossly stable size and morphology of acute hemorrhage involving the right cerebellum. No visible underlying lesion on this noncontrast examination. No significant regional mass effect or midline shift. 2. No other acute intracranial abnormality. 3. Mild chronic microvascular ischemic disease for age.  Electronically Signed   By: Jeannine Boga M.D.   On: 01/20/2022 22:17   ? ?Assessment/Plan: ?Right cerebellar hemorrhage: The patient is stable clinically.  Observation is warranted. ? LOS: 1 day  ? ? ? ?J

## 2022-01-22 ENCOUNTER — Inpatient Hospital Stay (HOSPITAL_COMMUNITY): Payer: Medicare Other

## 2022-01-22 DIAGNOSIS — I6389 Other cerebral infarction: Secondary | ICD-10-CM

## 2022-01-22 DIAGNOSIS — I614 Nontraumatic intracerebral hemorrhage in cerebellum: Secondary | ICD-10-CM | POA: Diagnosis not present

## 2022-01-22 LAB — ECHOCARDIOGRAM COMPLETE
AR max vel: 1.44 cm2
AV Area VTI: 1.54 cm2
AV Area mean vel: 1.45 cm2
AV Mean grad: 6 mmHg
AV Peak grad: 10.8 mmHg
Ao pk vel: 1.64 m/s
Area-P 1/2: 8.25 cm2
Height: 62.75 in
S' Lateral: 3.5 cm
Weight: 2042.34 oz

## 2022-01-22 LAB — LIPID PANEL
Cholesterol: 187 mg/dL (ref 0–200)
HDL: 45 mg/dL (ref >40)
LDL Cholesterol: 125 mg/dL — ABNORMAL HIGH (ref 0–99)
Total CHOL/HDL Ratio: 4.2 RATIO
Triglycerides: 83 mg/dL (ref <150)
VLDL: 17 mg/dL (ref 0–40)

## 2022-01-22 LAB — HEMOGLOBIN A1C
Hgb A1c MFr Bld: 5.9 % — ABNORMAL HIGH (ref 4.8–5.6)
Mean Plasma Glucose: 122.63 mg/dL

## 2022-01-22 MED ORDER — POLYETHYLENE GLYCOL 3350 17 G PO PACK
17.0000 g | PACK | Freq: Every day | ORAL | Status: DC | PRN
  Administered 2022-01-22 – 2022-01-26 (×3): 17 g via ORAL
  Filled 2022-01-22 (×5): qty 1

## 2022-01-22 NOTE — Progress Notes (Signed)
Inpatient Rehabilitation Admissions Coordinator  ? ?I met with patient , spouse, 2 sons and a daughter at bedside. We discussed goals and expectations of a possible CIR admit. Patient asks that we see how she progresses over the next couple of days, but is in agreement for me to begin Auth with [facility] in case she requires admit. I will begin Auth once I get updated therapy notes from today. ? ?Danne Baxter, RN, MSN ?Rehab Admissions Coordinator ?[phone] ?01/22/2022 12:08 PM ? ?

## 2022-01-22 NOTE — Progress Notes (Signed)
Patient packed up and helped into the wheelchair for transport to [facility]. Belongings in her bag. Attempted to call her family contact in the chart but there was no answer. Pt given PRNs before trip for dizziness and nausea. Pt denies pain. Tadao Emig, Rande Brunt, RN  ?

## 2022-01-22 NOTE — PMR Pre-admission (Signed)
PMR Admission Coordinator Pre-Admission Assessment ? ?Patient: Molly Patterson is an 88 y.o., female ?MRN: [id number] ?DOB: 1932-04-15 ?Height: 5' 2.75" (159.4 cm) ?Weight: 57.9 kg ? ?Insurance Information ?HMO: yes    PPO:      PCP:      IPA:      80/20:      OTHER:  ?PRIMARY: Blue Medicare      Policy#: [id number]      Subscriber: pt ?CM Name: Larene Beach      Phone#: [phone]     Fax#: [phone] ?Pre-Cert#: TBD approved for 7 days      Employer:  ?Benefits:  Phone #: [phone]     Name: 5/10 ?Eff. Date: 09/15/21     Deduct: none      Out of Pocket Max: $3600      Life Max: none ?CIR: $335 co pay per day days 1 until 6      SNF: no c co pay days 1 until 20; $196 co co pay per day days 21 until 60; no c pay days 61 until 100 ?Outpatient: $10 per visit     Co-Pay: visits per medical necessity ?Home Health: 100%      Co-Pay: visits per medical neccesity ?DME: 80%     Co-Pay: 20% ?Providers: in network ? ?SECONDARY: none ? ?Financial Counselor:       Phone#:  ? ?The ?Data Collection Information Summary? for patients in Inpatient Rehabilitation Facilities with attached ?Privacy Act [place] Records? was provided and verbally reviewed with: Patient and Family ? ?Emergency Contact Information ?Contact Information   ? ? Name Relation Home Work Mobile  ? Danisa, Ko[place] Spouse [phone]    ? ?  ? ?Current Medical History  ?Patient Admitting Diagnosis: ICH ? ?History of Present Illness: 88 year old female with history of  DM type II, HTN and osteoarthritis. 10 days prior to admit developed severe dizziness, spinning with N/V. Presented twice to [place] on 01/20/22 with CT head showed a right cerebellar intraparenchymal hemorrhage. CTA showed no LVO, aneurysm or vascular malformation. Severe R P2 stenosis.  ? ?Neurology consulted. Felt IPH in setting of uncontrolled HTN. MRI stable right cerebellar IPH, mild chronic microvascular disease. VTE Prophylaxis SCDs.  No antithrombotic pta  and none secondary to IPH. Home meds of HCTZ. Initially placed on Cleviprex and using labetalol and hydralazine.  ? ?Complete NIHSS TOTAL: 0 ? ?Patient's medical record from [facility] has been reviewed by the rehabilitation admission coordinator and physician. ? ?Past Medical History  ?Past Medical History:  ?Diagnosis Date  ? Allergy   ? hayfever  ? ANEMIA-IRON DEFICIENCY 08/02/2009  ? BURSITIS, HIP 08/02/2009  ? COVID-19   ? HYPERTENSION 08/02/2009  ? OSTEOARTHRITIS, GENERALIZED 08/12/2010  ? Raynaud's syndrome 08/12/2010  ? Transfusion history 09/15/1962  ? ?Has the patient had major surgery during 100 days prior to admission? No ? ?Family History   ?family history is not on file. ? ?Current Medications ? ?Current Facility-Administered Medications:  ?  acetaminophen (TYLENOL) tablet 650 mg, 650 mg, Oral, Q4H PRN, 650 mg at 01/21/22 0354 **OR** [DISCONTINUED] acetaminophen (TYLENOL) 160 MG/5ML solution 650 mg, 650 mg, Per Tube, Q4H PRN **OR** [DISCONTINUED] acetaminophen (TYLENOL) suppository 650 mg, 650 mg, Rectal, Q4H PRN, Derek Jack, MD ?  amLODipine (NORVASC) tablet 10 mg, 10 mg, Oral, Daily, de Yolanda Manges, [place] E, NP, 10 mg at 01/27/22 0855 ?  benazepril (LOTENSIN) tablet 10 mg, 10 mg, Oral, Daily, de Yolanda Manges, [place] E, NP,  10 mg at 01/27/22 0855 ?  cholecalciferol (VITAMIN D3) tablet 1,000 Units, 1,000 Units, Oral, Daily, Cherene Altes, MD, 1,000 Units at 01/27/22 0855 ?  feeding supplement (ENSURE ENLIVE / ENSURE PLUS) liquid 237 mL, 237 mL, Oral, BID BM, Derek Jack, MD, 237 mL at 01/27/22 7782 ?  hydrALAZINE (APRESOLINE) injection 20 mg, 20 mg, Intravenous, Q6H PRN, de Yolanda Manges, [place] E, NP ?  hydrochlorothiazide (HYDRODIURIL) tablet 12.5 mg, 12.5 mg, Oral, Daily, de [place], [place] E, NP, 12.5 mg at 01/27/22 4235 ?  labetalol (NORMODYNE) injection 10 mg, 10 mg, Intravenous, Q2H PRN, de Yolanda Manges, [place]ney E, NP ?  meclizine (ANTIVERT) tablet 12.5 mg, 12.5 mg, Oral, TID PRN,  Derek Jack, MD, 12.5 mg at 01/27/22 0423 ?  multivitamin with minerals tablet 1 tablet, 1 tablet, Oral, Daily, Cherene Altes, MD, 1 tablet at 01/27/22 0855 ?  ondansetron (ZOFRAN) injection 4 mg, 4 mg, Intravenous, Q6H PRN, Shela Leff, MD, 4 mg at 01/27/22 0418 ?  pantoprazole (PROTONIX) EC tablet 40 mg, 40 mg, Oral, QHS, Garvin Fila, MD, 40 mg at 01/26/22 2003 ?  polyethylene glycol (MIRALAX / GLYCOLAX) packet 17 g, 17 g, Oral, Daily PRN, Janine Ores, NP, 17 g at 01/26/22 2045 ?  rosuvastatin (CRESTOR) tablet 10 mg, 10 mg, Oral, Daily, Rosalin Hawking, MD, 10 mg at 01/27/22 0855 ?  traMADol (ULTRAM) tablet 50 mg, 50 mg, Oral, Q6H PRN, Donnetta Simpers, MD, 50 mg at 01/26/22 2003 ?  vitamin B-12 (CYANOCOBALAMIN) tablet 1,000 mcg, 1,000 mcg, Oral, Daily, Cherene Altes, MD, 1,000 mcg at 01/27/22 0855 ? ?Patients Current Diet:  ?Diet Order   ? ?       ?  Diet regular Room service appropriate? Yes; Fluid consistency: Thin  Diet effective now       ?  ? ?  ?  ? ?  ? ?Precautions / Restrictions ?Precautions ?Precautions: Fall ?Precaution Comments: no nausea or dizziness today ?Restrictions ?Weight Bearing Restrictions: Yes  ? ?Has the patient had 2 or more falls or a fall with injury in the past year? No ? ?Prior Activity Level ?Community (5-7x/wk): independent, active and driving ? ?Prior Functional Level ?Self Care: Did the patient need help bathing, dressing, using the toilet or eating? Independent ? ?Indoor Mobility: Did the patient need assistance with walking from room to room (with or without device)? Independent ? ?Stairs: Did the patient need assistance with internal or external stairs (with or without device)? Independent ? ?Functional Cognition: Did the patient need help planning regular tasks such as shopping or remembering to take medications? Independent ? ?Patient Information ?Are you of Hispanic, Latino/a,or Spanish origin?: A. No, not of Hispanic, Latino/a, or Spanish  origin ?What is your race?: A. White ?Do you need or want an interpreter to communicate with a doctor or health care staff?: 0. No ? ?Patient's Response To:  ?Health Literacy and Transportation ?Is the patient able to respond to health literacy and transportation needs?: Yes ?Health Literacy - How often do you need to have someone help you when you read instructions, pamphlets, or other written material from your doctor or pharmacy?: Never ?In the past 12 months, has lack of transportation kept you from medical appointments or from getting medications?: No ?In the past 12 months, has lack of transportation kept you from meetings, work, or from getting things needed for daily living?: No ? ?Home Assistive Devices / Equipment ?Home Assistive Devices/Equipment: Eyeglasses, Hearing aid ?Home Equipment: Grab  bars - toilet, Grab bars - tub/shower, Hand held shower head ? ?Prior Device Use: Indicate devices/aids used by the patient prior to current illness, exacerbation or injury? None of the above ? ?Current Functional Level ?Cognition ? Overall Cognitive Status: Within Functional Limits for tasks assessed ?Orientation Level: Oriented X4 ?General Comments: anxious initially due to fear of mobility exacerbating dizziness and nausea - overall WFL ?   ?Extremity Assessment ?(includes Sensation/Coordination) ? Upper Extremity Assessment: Generalized weakness  ?Lower Extremity Assessment: Defer to PT evaluation  ?  ?ADLs ? Overall ADL's : Needs assistance/impaired ?Eating/Feeding: Independent, Sitting ?Grooming: Min guard, Dance movement psychotherapist, Standing, Wash/dry hands ?Upper Body Bathing: Minimal assistance, Sitting ?Lower Body Bathing: Maximal assistance, +2 for safety/equipment, Sit to/from stand ?Upper Body Dressing : Set up, Sitting ?Lower Body Dressing: Maximal assistance, +2 for safety/equipment, Sit to/from stand ?Toilet Transfer: Min guard, Ambulation, Rolling walker (2 wheels), Grab bars, Regular Toilet ?Toileting- Clothing  Manipulation and Hygiene: Minimal assistance, Sit to/from stand ?Toileting - Clothing Manipulation Details (indicate cue type and reason): Min A for balance and managing gown ?Functional mobility during ADLs:

## 2022-01-22 NOTE — Progress Notes (Signed)
?  Echocardiogram ?2D Echocardiogram has been performed. ? ?Shaylyn Bawa  Romeo Rabon ?01/22/2022, 11:57 AM ?

## 2022-01-22 NOTE — Progress Notes (Signed)
STROKE TEAM PROGRESS NOTE  ? ?INTERVAL HISTORY ?Patient is seen in her room with no family at the bedside.  She reports no complaints today.   Neurological exam is nonfocal.  Therapist recommend inpatient rehab.  Blood pressure adequately controlled ? ?Vitals:  ? 01/22/22 1100 01/22/22 1200 01/22/22 1300 01/22/22 1400  ?BP: 125/76   118/77  ?Pulse: 81 90 84 93  ?Resp: '14 19 14 17  '$ ?Temp:      ?TempSrc:      ?SpO2: 98% 96% 95% 96%  ?Weight:      ?Height:      ? ?CBC:  ?Recent Labs  ?Lab 01/20/22 ?1359  ?WBC 9.6  ?NEUTROABS 7.6  ?HGB 15.9*  ?HCT 49.1*  ?MCV 89.8  ?PLT 311  ? ?Basic Metabolic Panel:  ?Recent Labs  ?Lab 01/20/22 ?1359  ?NA 138  ?K 4.1  ?CL 102  ?CO2 27  ?GLUCOSE 119*  ?BUN 9  ?CREATININE 0.62  ?CALCIUM 9.2  ? ?Lipid Panel:  ?Recent Labs  ?Lab 01/22/22 ?0128  ?CHOL 187  ?TRIG 83  ?HDL 45  ?CHOLHDL 4.2  ?VLDL 17  ?LDLCALC 125*  ? ?HgbA1c:  ?Recent Labs  ?Lab 01/22/22 ?0128  ?HGBA1C 5.9*  ? ?Urine Drug Screen: No results for input(s): LABOPIA, COCAINSCRNUR, LABBENZ, AMPHETMU, THCU, LABBARB in the last 168 hours.  ?Alcohol Level No results for input(s): ETH in the last 168 hours. ? ?IMAGING past 24 hours ?ECHOCARDIOGRAM COMPLETE ? ?Result Date: 01/22/2022 ?   ECHOCARDIOGRAM REPORT   Patient Name:   Molly Patterson Date of Exam: 01/22/2022 Medical Rec #:  [id number]          Height:       62.8 in Accession #:    [id number]         Weight:       127.6 lb Date of Birth:  July 11, 1932          BSA:          1.593 m? Patient Age:    88 years           BP:           112/68 mmHg Patient Gender: F                  HR:           84 bpm. Exam Location:  Inpatient Procedure: 2D Echo, Color Doppler and Limited Color Doppler Indications:    Stroke  History:        Patient has no prior history of Echocardiogram examinations.  Sonographer:    Joette Catching RCS Referring Phys: [id number] Molly Patterson  1. Left ventricular ejection fraction, by estimation, is 60 to 65%. The left ventricle has normal  function. The left ventricle has no regional wall motion abnormalities. Left ventricular diastolic parameters are consistent with Grade I diastolic dysfunction (impaired relaxation).  2. Right ventricular systolic function is normal. The right ventricular size is normal.  3. The mitral valve is normal in structure. Trivial mitral valve regurgitation.  4. The aortic valve is tricuspid. There is mild calcification of the aortic valve. There is mild thickening of the aortic valve. Aortic valve regurgitation is trivial. Aortic valve sclerosis/calcification is present, without any evidence of aortic stenosis.  5. The inferior vena cava is normal in size with greater than 50% respiratory variability, suggesting right atrial pressure of 3 mmHg. Comparison(s): No prior Echocardiogram. Conclusion(s)/Recommendation(s): No intracardiac source of embolism detected on this  transthoracic study. Consider a transesophageal echocardiogram to exclude cardiac source of embolism if clinically indicated. FINDINGS  Left Ventricle: Left ventricular ejection fraction, by estimation, is 60 to 65%. The left ventricle has normal function. The left ventricle has no regional wall motion abnormalities. The left ventricular internal cavity size was normal in size. There is  no left ventricular hypertrophy. Left ventricular diastolic parameters are consistent with Grade I diastolic dysfunction (impaired relaxation). Right Ventricle: The right ventricular size is normal. No increase in right ventricular wall thickness. Right ventricular systolic function is normal. Left Atrium: Left atrial size was normal in size. Right Atrium: Right atrial size was normal in size. Pericardium: There is no evidence of pericardial effusion. Mitral Valve: The mitral valve is normal in structure. Trivial mitral valve regurgitation. Tricuspid Valve: The tricuspid valve is normal in structure. Tricuspid valve regurgitation is trivial. Aortic Valve: The aortic valve is  tricuspid. There is mild calcification of the aortic valve. There is mild thickening of the aortic valve. Aortic valve regurgitation is trivial. Aortic valve sclerosis/calcification is present, without any evidence of aortic stenosis. Aortic valve mean gradient measures 6.0 mmHg. Aortic valve peak gradient measures 10.8 mmHg. Aortic valve area, by VTI measures 1.54 cm?. Pulmonic Valve: The pulmonic valve was not well visualized. Pulmonic valve regurgitation is trivial. Aorta: The aortic root and ascending aorta are structurally normal, with no evidence of dilitation. Venous: The inferior vena cava is normal in size with greater than 50% respiratory variability, suggesting right atrial pressure of 3 mmHg. IAS/Shunts: The atrial septum is grossly normal.  LEFT VENTRICLE PLAX 2D LVIDd:         5.20 cm   Diastology LVIDs:         3.50 cm   LV e' medial:    4.35 cm/s LV PW:         0.70 cm   LV E/e' medial:  11.2 LV IVS:        0.60 cm   LV e' lateral:   7.18 cm/s LVOT diam:     1.70 cm   LV E/e' lateral: 6.8 LV SV:         47 LV SV Index:   30 LVOT Area:     2.27 cm?  RIGHT VENTRICLE             IVC RV Basal diam:  3.90 cm     IVC diam: 1.40 cm RV Mid diam:    3.00 cm RV S prime:     21.90 cm/s TAPSE (M-mode): 2.2 cm LEFT ATRIUM             Index        RIGHT ATRIUM           Index LA diam:        3.10 cm 1.95 cm/m?   RA Area:     12.10 cm? LA Vol (A2C):   25.1 ml 15.75 ml/m?  RA Volume:   26.40 ml  16.57 ml/m? LA Vol (A4C):   38.3 ml 24.04 ml/m? LA Biplane Vol: 32.0 ml 20.08 ml/m?  AORTIC VALVE                     PULMONIC VALVE AV Area (Vmax):    1.44 cm?      PV Vmax:       0.84 m/s AV Area (Vmean):   1.45 cm?      PV Peak grad:  2.8 mmHg AV Area (VTI):  1.54 cm? AV Vmax:           164.00 cm/s AV Vmean:          121.000 cm/s AV VTI:            0.306 m AV Peak Grad:      10.8 mmHg AV Mean Grad:      6.0 mmHg LVOT Vmax:         104.00 cm/s LVOT Vmean:        77.100 cm/s LVOT VTI:          0.208 m LVOT/AV VTI ratio:  0.68  AORTA Ao Root diam: 3.10 cm Ao Asc diam:  3.20 cm MITRAL VALVE               TRICUSPID VALVE MV Area (PHT): 8.25 cm?    TR Peak grad:   27.2 mmHg MV Decel Time: 92 msec     TR Vmax:        261.00 cm/s MV E velocity: 48.80 cm/s MV A velocity: 83.60 cm/s  SHUNTS MV E/A ratio:  0.58        Systemic VTI:  0.21 m                            Systemic Diam: 1.70 cm Gwyndolyn Kaufman MD Electronically signed by Gwyndolyn Kaufman MD Signature Date/Time: 01/22/2022/12:41:22 PM    Final    ? ?PHYSICAL EXAM ?General:  Alert, thin-appearing elderly patient in no acute distress ?Respiratory:  Regular, unlabored respirations on room air ? ?NEURO:  ?Mental Status: AA&Ox3  ?Speech/Language: speech is without dysarthria or aphasia.  Fluency, and comprehension intact. ? ?Cranial Nerves:  ?II: PERRL. Visual fields full.  ?III, IV, VI: EOMI with left sided dysmetric saccades. Eyelids elevate symmetrically.  ?V: Sensation is intact to light touch and symmetrical to face.  ?VII: Smile is symmetrical.  ?VIII: hearing intact to voice. ?IX, X: Phonation is normal.  ?[id number] shrug 5/5. ?XII: tongue is midline without fasciculations. ?Motor: 5/5 strength to all muscle groups tested.  ?Sensation- Intact to light touch bilaterally.  ?Coordination: FTN intact bilaterally, HKS: no ataxia in BLE.No drift.  ?Gait- deferred ? ? ?ASSESSMENT/PLAN ?Molly Patterson is a 88 y.o. female with history of HTN and osteoarthritis presenting with  severe dizziness with nausea and vomiting for about ten days for which she was seen twice in the ED prior to this admission.  CT head performed on admission here shows a small right cerebellar IPH. ? ?IPH:  right cerebellar IPH in setting of uncontrolled hypertension ?CT head Small acute IPH in right cerebellum ?CTA head & neck No LVO, aneurysm or vascular malformation, severe right P2 stenosis ?MRI  stable right cerebellar IPH, mild chronic microvascular disease ?2D Echo pending ?LDL No results found  for requested labs within last 26280 hours. ?HgbA1c No results found for requested labs within last 26280 hours. ?VTE prophylaxis - SCDs ?   ?Diet  ? Diet Heart Room service appropriate? Yes; Fluid consistency:

## 2022-01-22 NOTE — Progress Notes (Signed)
Physical Therapy Treatment ?Patient Details ?Name: Molly Patterson ?MRN: [id number] ?DOB: Apr 30, 1932 ?Today's Date: 01/22/2022 ? ? ?History of Present Illness Pt is 88 yo female who has been having dizziness and nausea with vomiting. She presented to [facility]x with first CT neg. CT at [facility] showed small acute IPH R cerebellum. PMH: HTN, OA, Raynaud's. ? ?  ?PT Comments  ? ? Pt tolerates treatment well, reporting wooziness with mobility rather than dizziness at this time. Pt demonstrates impaired balance intermittently with one posterior loss of balance prior to stand to sit and one when ambulating with UE support of walker. PT continues to reinforce gaze stabilization education. Pt will benefit from continued aggressive mobilization in an effort to reduce falls risk. PT continues to recommend AIR admission as the pt was very independent and active prior to admission, continuing to work as a Therapist, sports and gardening.   ?Recommendations for follow up therapy are one component of a multi-disciplinary discharge planning process, led by the attending physician.  Recommendations may be updated based on patient status, additional functional criteria and insurance authorization. ? ?Follow Up Recommendations ? Acute inpatient rehab (3hours/day) ?  ?  ?Assistance Recommended at Discharge Frequent or constant Supervision/Assistance  ?Patient can return home with the following A lot of help with walking and/or transfers;A lot of help with bathing/dressing/bathroom;Assistance with cooking/housework;Assist for transportation;Help with stairs or ramp for entrance ?  ?Equipment Recommendations ? Rolling walker (2 wheels)  ?  ?Recommendations for Other Services   ? ? ?  ?Precautions / Restrictions Precautions ?Precautions: Fall ?Precaution Comments: dizzy +nausea ?Restrictions ?Weight Bearing Restrictions: No  ?  ? ?Mobility ? Bed Mobility ?Overal bed mobility: Needs Assistance ?Bed Mobility: Supine to Sit ?  ?  ?Supine to sit: Min  assist, HOB elevated ?  ?  ?General bed mobility comments: hand hold, cues for gaze stabilization ?  ? ?Transfers ?Overall transfer level: Needs assistance ?Equipment used: 1 person hand held assist, Rolling walker (2 wheels) ?Transfers: Sit to/from Stand, Bed to chair/wheelchair/BSC ?Sit to Stand: Min assist, Min guard ?  ?Step pivot transfers: Min assist ?  ?  ?  ?General transfer comment: minG for sit to stand with RW< verbal cues for hand placement. ?  ? ?Ambulation/Gait ?Ambulation/Gait assistance: Min assist ?Gait Distance (Feet): 20 Feet ?Assistive device: Rolling walker (2 wheels) ?Gait Pattern/deviations: Step-to pattern ?Gait velocity: reduced ?Gait velocity interpretation: <1.8 ft/sec, indicate of risk for recurrent falls ?  ?General Gait Details: pt with slowed step-to gait, cues for gait stabilization. Pt with one posterior loss of balance when turning during gait ? ? ?Stairs ?  ?  ?  ?  ?  ? ? ?Wheelchair Mobility ?  ? ?Modified Rankin (Stroke Patients Only) ?Modified Rankin (Stroke Patients Only) ?Pre-Morbid Rankin Score: No symptoms ?Modified Rankin: Moderately severe disability ? ? ?  ?Balance Overall balance assessment: Needs assistance ?Sitting-balance support: No upper extremity supported, Feet supported ?Sitting balance-Leahy Scale: Fair ?  ?  ?Standing balance support: Bilateral upper extremity supported, Reliant on assistive device for balance ?Standing balance-Leahy Scale: Poor ?Standing balance comment: posterior lean ?  ?  ?  ?  ?  ?  ?  ?  ?  ?  ?  ?  ? ?  ?Cognition Arousal/Alertness: Awake/alert ?Behavior During Therapy: [facility] for tasks assessed/performed ?Overall Cognitive Status: Within Functional Limits for tasks assessed ?  ?  ?  ?  ?  ?  ?  ?  ?  ?  ?  ?  ?  ?  ?  ?  ?  ?  ?  ? ?  ?  Exercises   ? ?  ?General Comments General comments (skin integrity, edema, etc.): VSS, pt reports wooziness and feeling weak but no acute dizziness. Slowed pursuits and pt with difficulty maintaining focus  for VOR assessment ?  ?  ? ?Pertinent Vitals/Pain Pain Assessment ?Pain Assessment: No/denies pain  ? ? ?Home Living   ?  ?  ?  ?  ?  ?  ?  ?  ?  ?   ?  ?Prior Function    ?  ?  ?   ? ?PT Goals (current goals can now be found in the care plan section) Acute Rehab PT Goals ?Patient Stated Goal: no dizziness, return home ?Progress towards PT goals: Progressing toward goals ? ?  ?Frequency ? ? ? Min 4X/week ? ? ? ?  ?PT Plan Current plan remains appropriate  ? ? ?Co-evaluation   ?  ?  ?  ?  ? ?  ?AM-PAC PT "6 Clicks" Mobility   ?Outcome Measure ? Help needed turning from your back to your side while in a flat bed without using bedrails?: A Little ?Help needed moving from lying on your back to sitting on the side of a flat bed without using bedrails?: A Little ?Help needed moving to and from a bed to a chair (including a wheelchair)?: A Little ?Help needed standing up from a chair using your arms (e.g., wheelchair or bedside chair)?: A Little ?Help needed to walk in hospital room?: A Little ?Help needed climbing 3-5 steps with a railing? : Total ?6 Click Score: 16 ? ?  ?End of Session   ?Activity Tolerance: Patient tolerated treatment well ?Patient left: in chair;with call bell/phone within reach;with chair alarm set ?Nurse Communication: Mobility status ?PT Visit Diagnosis: Unsteadiness on feet (R26.81);Difficulty in walking, not elsewhere classified (R26.2);Dizziness and giddiness (R42) ?  ? ? ?Time: 6659-9357 ?PT Time Calculation (min) (ACUTE ONLY): 29 min ? ?Charges:  $Gait Training: 8-22 mins ?$Therapeutic Activity: 8-22 mins          ?          ? ?Zenaida Niece, PT, DPT ?Acute Rehabilitation ?Pager: [phone] ?Office [phone] ? ? ? ?Zenaida Niece ?01/22/2022, 2:08 PM ? ?

## 2022-01-22 NOTE — Progress Notes (Signed)
Subjective: ?The patient is alert and pleasant.  She complains of nausea and vomiting. ? ?Objective: ?Vital signs in last 24 hours: ?Temp:  [97 ?F (36.1 ?C)-99.1 ?F (37.3 ?C)] 97 ?F (36.1 ?C) (05/10 0800) ?Pulse Rate:  [71-106] 92 (05/10 0900) ?Resp:  [10-22] 17 (05/10 0900) ?BP: (113-142)/(70-105) 141/82 (05/10 0900) ?SpO2:  [92 %-96 %] 95 % (05/10 0900) ?Estimated body mass index is 22.79 kg/m? as calculated from the following: ?  Height as of this encounter: 5' 2.75" (1.594 m). ?  Weight as of this encounter: 57.9 kg. ? ? ?Intake/Output from previous day: ?05/09 0701 - 05/10 0700 ?In: 262 [P.O.:260; I.V.:2] ?Out: 1050 [Urine:1050] ?Intake/Output this shift: ?No intake/output data recorded. ? ?Physical exam the patient is alert and oriented x3.  She is moving all 4 extremities well. ? ?Lab Results: ?Recent Labs  ?  01/20/22 ?6712  ?WBC 9.6  ?HGB 15.9*  ?HCT 49.1*  ?PLT 311  ? ?BMET ?Recent Labs  ?  01/20/22 ?1359  ?NA 138  ?K 4.1  ?CL 102  ?CO2 27  ?GLUCOSE 119*  ?BUN 9  ?CREATININE 0.62  ?CALCIUM 9.2  ? ? ?Studies/Results: ?CT ANGIO HEAD NECK W WO CM ? ?Result Date: 01/20/2022 ?CLINICAL DATA:  Neuro deficit, acute, stroke suspected. Acute onset of dizziness. EXAM: CT ANGIOGRAPHY HEAD AND NECK TECHNIQUE: Multidetector CT imaging of the head and neck was performed using the standard protocol during bolus administration of intravenous contrast. Multiplanar CT image reconstructions and MIPs were obtained to evaluate the vascular anatomy. Carotid stenosis measurements (when applicable) are obtained utilizing NASCET criteria, using the distal internal carotid diameter as the denominator. RADIATION DOSE REDUCTION: This exam was performed according to the departmental dose-optimization program which includes automated exposure control, adjustment of the mA and/or kV according to patient size and/or use of iterative reconstruction technique. CONTRAST:  62m OMNIPAQUE IOHEXOL 350 MG/ML SOLN COMPARISON:  None Available.  FINDINGS: CTA NECK FINDINGS Aortic arch: Standard 3 vessel aortic arch with a moderate amount of atherosclerotic plaque. Patent brachiocephalic and subclavian arteries without evidence of a significant stenosis. Right carotid system: Patent with minimal scattered atherosclerotic plaque. No evidence of a dissection or stenosis. Left carotid system: Patent with minimal scattered atherosclerotic plaque. No evidence of a dissection or stenosis. Partially retropharyngeal course of the mid cervical ICA. Vertebral arteries: Patent with scattered atherosclerotic plaque involving the left greater than right vertebral arteries. No evidence of a significant stenosis or dissection. Mildly dominant left vertebral artery. Skeleton: Advanced disc and facet degeneration in the cervical spine. Interbody and facet ankylosis at C2-3. Other neck: No evidence of cervical lymphadenopathy or mass. Upper chest: Mild biapical pleuroparenchymal lung scarring. Minimal dependent atelectasis bilaterally. Review of the MIP images confirms the above findings CTA HEAD FINDINGS Anterior circulation: The internal carotid arteries are patent from skull base to carotid termini with a small to moderate amount of calcified plaque bilaterally not resulting in significant stenosis. ACAs and MCAs are patent with mild atherosclerotic irregularity but no evidence of a proximal branch occlusion or flow limiting proximal stenosis. No aneurysm or vascular malformation is identified. Posterior circulation: The intracranial vertebral arteries are widely patent to the basilar. Patent PICA, AICA, and SCA origins are seen bilaterally. The basilar artery is widely patent. Posterior communicating arteries are diminutive or absent. Both PCAs are patent without evidence of a significant proximal stenosis on the left. There is a severe mid to distal right P2 stenosis. No aneurysm or vascular malformation is identified. Venous sinuses: As permitted  by contrast timing,  patent. Anatomic variants: None. Review of the MIP images confirms the above findings IMPRESSION: 1. No large vessel occlusion, aneurysm, or vascular malformation. 2. Intracranial atherosclerosis with a severe right P2 stenosis. 3. Widely patent cervical carotid and vertebral arteries. 4. Aortic Atherosclerosis (ICD10-I70.0). Electronically Signed   By: Logan Bores M.D.   On: 01/20/2022 17:03  ? ?CT Head Wo Contrast ? ?Result Date: 01/20/2022 ?CLINICAL DATA:  Dizziness. EXAM: CT HEAD WITHOUT CONTRAST TECHNIQUE: Contiguous axial images were obtained from the base of the skull through the vertex without intravenous contrast. RADIATION DOSE REDUCTION: This exam was performed according to the departmental dose-optimization program which includes automated exposure control, adjustment of the mA and/or kV according to patient size and/or use of iterative reconstruction technique. COMPARISON:  CT head dated January 12, 2022. FINDINGS: Brain: New small acute intraparenchymal hemorrhage in the right cerebellum (series 3, image 7). No intraventricular extension. No evidence of acute infarction, hydrocephalus, extra-axial collection or mass lesion/mass effect. Stable mild atrophy and chronic microvascular ischemic changes, within normal limits for age. Vascular: Atherosclerotic vascular calcification of the carotid siphons. No hyperdense vessel. Skull: Normal. Negative for fracture or focal lesion. Sinuses/Orbits: No acute finding. Other: None. IMPRESSION: 1. New small acute intraparenchymal hemorrhage in the right cerebellum. Critical Value/emergent results were called by telephone at the time of interpretation on 01/20/2022 at 2:25 pm to provider [facility], who verbally acknowledged these results. Electronically Signed   By: Titus Dubin M.D.   On: 01/20/2022 14:25  ? ?MR BRAIN WO CONTRAST ? ?Result Date: 01/20/2022 ?CLINICAL DATA:  Initial evaluation for acute dizziness. EXAM: MRI HEAD WITHOUT CONTRAST TECHNIQUE:  Multiplanar, multiecho pulse sequences of the brain and surrounding structures were obtained without intravenous contrast. COMPARISON:  Prior CT from earlier the same day. FINDINGS: Brain: Cerebral volume within normal limits for age. Mild chronic microvascular ischemic disease noted involving the periventricular and deep white matter both cerebral hemispheres. Previously identified acute intraparenchymal hemorrhage involving the right cerebellum again seen, grossly stable in size and morphology as compared to previous. No visible underlying lesion on this noncontrast examination. No significant regional mass effect or midline shift. Adjacent fourth ventricle remains widely patent. No other evidence for acute or chronic intracranial hemorrhage. No other acute large vessel territory infarct. No mass lesion or midline shift. No hydrocephalus or extra-axial fluid collection. Pituitary gland and suprasellar region within normal limits. Midline structures intact. Vascular: Major intracranial vascular flow voids are maintained. Skull and upper cervical spine: Craniocervical junction within normal limits. Bone marrow signal intensity nor heterogeneous but overall within normal limits. No scalp soft tissue abnormality. Sinuses/Orbits: Globes and orbital soft tissues within normal limits. Paranasal sinuses and mastoid air cells are largely clear. Other: None. IMPRESSION: 1. Grossly stable size and morphology of acute hemorrhage involving the right cerebellum. No visible underlying lesion on this noncontrast examination. No significant regional mass effect or midline shift. 2. No other acute intracranial abnormality. 3. Mild chronic microvascular ischemic disease for age. Electronically Signed   By: Jeannine Boga M.D.   On: 01/20/2022 22:17   ? ?Assessment/Plan: ?Cerebellar hemorrhage: Continue observation. ? LOS: 2 days  ? ? ? ?Ophelia Charter ?01/22/2022, 10:26 AM ? ? ? ? ?Patient ID: Molly Patterson, female    DOB: 1931-11-23, 88 y.o.   MRN: [id number] ? ?

## 2022-01-23 DIAGNOSIS — I614 Nontraumatic intracerebral hemorrhage in cerebellum: Secondary | ICD-10-CM | POA: Diagnosis not present

## 2022-01-23 MED ORDER — VITAMIN B-12 1000 MCG PO TABS
1000.0000 ug | ORAL_TABLET | Freq: Every day | ORAL | Status: DC
  Administered 2022-01-23 – 2022-01-27 (×5): 1000 ug via ORAL
  Filled 2022-01-23 (×5): qty 1

## 2022-01-23 MED ORDER — ADULT MULTIVITAMIN W/MINERALS CH
1.0000 | ORAL_TABLET | Freq: Every day | ORAL | Status: DC
  Administered 2022-01-23 – 2022-01-27 (×5): 1 via ORAL
  Filled 2022-01-23 (×5): qty 1

## 2022-01-23 MED ORDER — VITAMIN D 25 MCG (1000 UNIT) PO TABS
1000.0000 [IU] | ORAL_TABLET | Freq: Every day | ORAL | Status: DC
  Administered 2022-01-23 – 2022-01-27 (×5): 1000 [IU] via ORAL
  Filled 2022-01-23 (×5): qty 1

## 2022-01-23 NOTE — Progress Notes (Signed)
?  Inpatient Rehabilitation Admissions Coordinator  ? ?I met with patient and son at bedside. I have insurance approval for CIR admit , but no bed available today. They are in agreement to admit and prefer CIr if bed were to become available. If no bed, they would hope they she progresses to go home with OP in several days. ? ?Danne Baxter, RN, MSN ?Rehab Admissions Coordinator ?[phone] ?01/23/2022 12:07 PM ?

## 2022-01-23 NOTE — Progress Notes (Signed)
Initial Nutrition Assessment ? ?DOCUMENTATION CODES:  ? ?Not applicable ? ?INTERVENTION:  ? ?Encourage PO intake ? ?Ensure Enlive po BID, each supplement provides 350 kcal and 20 grams of protein. ? ? ?NUTRITION DIAGNOSIS:  ? ?Increased nutrient needs related to  (rehab therapies) as evidenced by estimated needs. ? ?GOAL:  ? ?Patient will meet greater than or equal to 90% of their needs ? ?MONITOR:  ? ?PO intake, Supplement acceptance ? ?REASON FOR ASSESSMENT:  ? ?Malnutrition Screening Tool ?  ? ?ASSESSMENT:  ? ?Pt with hx of HTN, osteoporosis, and anemia presented to ED with dizziness accompanied  by nausea and vomiting for about 10 days prior to admission. Imaging in ED showed a small right cerebellar intraparenchymal hemorrhage. ? ?Pt working with therapy at time of visits to patient.  ?Pt consuming 100% of her meals. She has ensure ordered between meals which she received this morning.  ?Therapy has recommended inpatient rehab.  ? ? ?Medications reviewed and include: Vitamin D 1000 IU daily, MVI with minerals daily, protonix, vitamin B12 1000 mcg daily ? ?Labs reviewed:  ?A1C: 5.9 ? ? ? ? ?Diet Order:   ?Diet Order   ? ?       ?  Diet Heart Room service appropriate? Yes; Fluid consistency: Thin  Diet effective now       ?  ? ?  ?  ? ?  ? ? ?EDUCATION NEEDS:  ? ?Not appropriate for education at this time ? ?Skin:  Skin Assessment: Reviewed RN Assessment ? ?Last BM:  5/8 ? ?Height:  ? ?Ht Readings from Last 1 Encounters:  ?01/20/22 5' 2.75" (1.594 m)  ? ? ?Weight:  ? ?Wt Readings from Last 1 Encounters:  ?01/20/22 57.9 kg  ? ? ?Ideal Body Weight:  52.3 kg ? ?BMI:  Body mass index is 22.79 kg/m?. ? ?Estimated Nutritional Needs:  ? ?Kcal:  1500-1700 kcal/d ? ?Protein:  75-90 g/d ? ?Fluid:  1.5-1.8 L/d ? ?Lockie Pares., RD, LDN, CNSC ?See AMiON for contact information  ? ?

## 2022-01-23 NOTE — Plan of Care (Signed)
Pt is alert oriented x 4. Ambulatory with walker to bedside commode when not having dizzy spells. Pt c/o dizziness, prn antivert given with effective results. Pt c/o chronic pain to hips prn tramadol given. Pt stated she did not eat much supper due to dizzy spells. Pt given snack. No distress noted. Call button within reach. ? ? ?Problem: Clinical Measurements: ?Goal: Ability to maintain clinical measurements within normal limits will improve ?Outcome: Progressing ?Goal: Will remain free from infection ?Outcome: Progressing ?Goal: Diagnostic test results will improve ?Outcome: Progressing ?Goal: Respiratory complications will improve ?Outcome: Progressing ?Goal: Cardiovascular complication will be avoided ?Outcome: Progressing ?  ?Problem: Activity: ?Goal: Risk for activity intolerance will decrease ?Outcome: Progressing ?  ?Problem: Nutrition: ?Goal: Adequate nutrition will be maintained ?Outcome: Progressing ?  ?Problem: Education: ?Goal: Knowledge of disease or condition will improve ?Outcome: Progressing ?Goal: Knowledge of secondary prevention will improve (SELECT ALL) ?Outcome: Progressing ?Goal: Knowledge of patient specific risk factors will improve (INDIVIDUALIZE FOR PATIENT) ?Outcome: Progressing ?Goal: Individualized Educational Video(s) ?Outcome: Progressing ?  ?Problem: Coping: ?Goal: Will verbalize positive feelings about self ?Outcome: Progressing ?Goal: Will identify appropriate support needs ?Outcome: Progressing ?  ?Problem: Self-Care: ?Goal: Ability to participate in self-care as condition permits will improve ?Outcome: Progressing ?Goal: Verbalization of feelings and concerns over difficulty with self-care will improve ?Outcome: Progressing ?Goal: Ability to communicate needs accurately will improve ?Outcome: Progressing ?  ?Problem: Nutrition: ?Goal: Risk of aspiration will decrease ?Outcome: Progressing ?Goal: Dietary intake will improve ?Outcome: Progressing ?  ?Problem: Intracerebral  Hemorrhage Tissue Perfusion: ?Goal: Complications of Intracerebral Hemorrhage will be minimized ?Outcome: Progressing ?  ?Problem: Ischemic Stroke/TIA Tissue Perfusion: ?Goal: Complications of ischemic stroke/TIA will be minimized ?Outcome: Progressing ?  ?Problem: Spontaneous Subarachnoid Hemorrhage Tissue Perfusion: ?Goal: Complications of Spontaneous Subarachnoid Hemorrhage will be minimized ?Outcome: Progressing ?  ?

## 2022-01-23 NOTE — Progress Notes (Signed)
Physical Therapy Treatment ?Patient Details ?Name: Molly Patterson ?MRN: [id number] ?DOB: 1932/06/11 ?Today's Date: 01/23/2022 ? ? ?History of Present Illness Pt is 88 yo female who has been having dizziness and nausea with vomiting. She presented to [facility] 2x with first CT neg. CT at [facility] showed small acute IPH R cerebellum. PMH: HTN, OA, Raynaud's. ? ?  ?PT Comments  ? ? Pt was seen for progression of gait on RW to get through her room and use stationary objects for steadying balance.  Pt has been successful in maintaining control on RW with min guard mainly, very little extra help needed.  Pt is also demonstrating good control of descent to bed with initially a rough control to sit, but thereafter was more aware and followed through on the instructions.  Pt will recover her mobility on RW with CIR plan, which is still appropriate given available help at dc and her depth of  needs that will benefit from the intensity of CIR.  Follow along as planned for inpt tx.  ?Recommendations for follow up therapy are one component of a multi-disciplinary discharge planning process, led by the attending physician.  Recommendations may be updated based on patient status, additional functional criteria and insurance authorization. ? ?Follow Up Recommendations ? Acute inpatient rehab (3hours/day) ?  ?  ?Assistance Recommended at Discharge Frequent or constant Supervision/Assistance  ?Patient can return home with the following A lot of help with walking and/or transfers;A lot of help with bathing/dressing/bathroom;Assistance with cooking/housework;Assist for transportation;Help with stairs or ramp for entrance ?  ?Equipment Recommendations ? Rolling walker (2 wheels)  ?  ?Recommendations for Other Services Rehab consult ? ? ?  ?Precautions / Restrictions Precautions ?Precautions: Fall ?Precaution Comments: dizzy but nausea was not occurring today ?Restrictions ?Weight Bearing Restrictions: No  ?  ? ?Mobility ? Bed  Mobility ?Overal bed mobility: Needs Assistance ?Bed Mobility: Supine to Sit, Sit to Supine ?  ?  ?Supine to sit: Min guard, Min assist ?Sit to supine: Min guard, Min assist ?  ?  ?  ? ?Transfers ?Overall transfer level: Needs assistance ?Equipment used: 1 person hand held assist ?Transfers: Sit to/from Stand ?Sit to Stand: Min guard ?  ?  ?  ?  ?  ?  ?  ? ?Ambulation/Gait ?Ambulation/Gait assistance: Min guard, Min assist ?Gait Distance (Feet): 30 Feet ?Assistive device: Rolling walker (2 wheels) ?Gait Pattern/deviations: Step-through pattern, Wide base of support, Knee flexed in stance - right, Knee flexed in stance - left ?Gait velocity: reduced ?Gait velocity interpretation: <1.31 ft/sec, indicative of household ambulator ?  ?General Gait Details: requires supervision for safety ? ? ?Stairs ?  ?  ?  ?  ?  ? ? ?Wheelchair Mobility ?  ? ?Modified Rankin (Stroke Patients Only) ?  ? ? ?  ?Balance Overall balance assessment: Needs assistance ?Sitting-balance support: Feet supported ?Sitting balance-Leahy Scale: Fair ?  ?  ?Standing balance support: Bilateral upper extremity supported, During functional activity ?Standing balance-Leahy Scale: Poor ?  ?  ?  ?  ?  ?  ?  ?  ?  ?  ?  ?  ?  ? ?  ?Cognition Arousal/Alertness: Awake/alert ?Behavior During Therapy: [facility] for tasks assessed/performed ?Overall Cognitive Status: Within Functional Limits for tasks assessed ?  ?  ?  ?  ?  ?  ?  ?  ?  ?  ?  ?  ?  ?  ?  ?  ?General Comments: anxious initially due to fear  of mobility exacerbating dizziness and nausea - overall WFL ?  ?  ? ?  ?Exercises   ? ?  ?General Comments General comments (skin integrity, edema, etc.): pt is able to walk with care, very motivated and using visual cues for stabilization given stroke in cerebellum with some success ?  ?  ? ?Pertinent Vitals/Pain Pain Assessment ?Pain Assessment: No/denies pain ?Faces Pain Scale: No hurt  ? ? ?Home Living   ?  ?  ?  ?  ?  ?  ?  ?  ?  ?   ?  ?Prior Function    ?  ?  ?    ? ?PT Goals (current goals can now be found in the care plan section) Acute Rehab PT Goals ?Patient Stated Goal: no dizziness, return home ?Progress towards PT goals: Progressing toward goals ? ?  ?Frequency ? ? ? Min 4X/week ? ? ? ?  ?PT Plan Current plan remains appropriate  ? ? ?Co-evaluation   ?  ?  ?  ?  ? ?  ?AM-PAC PT "6 Clicks" Mobility   ?Outcome Measure ? Help needed turning from your back to your side while in a flat bed without using bedrails?: A Little ?Help needed moving from lying on your back to sitting on the side of a flat bed without using bedrails?: A Little ?Help needed moving to and from a bed to a chair (including a wheelchair)?: A Little ?Help needed standing up from a chair using your arms (e.g., wheelchair or bedside chair)?: A Little ?Help needed to walk in hospital room?: A Little ?Help needed climbing 3-5 steps with a railing? : A Lot ?6 Click Score: 17 ? ?  ?End of Session Equipment Utilized During Treatment: Gait belt ?Activity Tolerance: Patient tolerated treatment well ?Patient left: in bed;with call bell/phone within reach;with bed alarm set ?Nurse Communication: Mobility status ?PT Visit Diagnosis: Unsteadiness on feet (R26.81);Difficulty in walking, not elsewhere classified (R26.2);Dizziness and giddiness (R42) ?  ? ? ?Time: 3614-4315 ?PT Time Calculation (min) (ACUTE ONLY): 20 min ? ?Charges:  $Gait Training: 8-22 mins  ?Ramond Dial ?01/23/2022, 9:33 PM ? ?Mee Hives, PT PhD ?Acute Rehab Dept. Number: [facility] [phone] and [place] [phone] ? ? ?

## 2022-01-23 NOTE — Care Management Important Message (Signed)
Important Message ? ?Patient Details  ?Name: Molly Patterson ?MRN: [id number] ?Date of Birth: [birth year] ? ? ?Medicare Important Message Given:  Yes ? ? ? ? ?Danely Bayliss ?01/23/2022, 2:54 PM ?

## 2022-01-23 NOTE — Plan of Care (Signed)
?  Problem: Clinical Measurements: ?Goal: Ability to maintain clinical measurements within normal limits will improve ?Outcome: Progressing ?Goal: Will remain free from infection ?Outcome: Progressing ?Goal: Diagnostic test results will improve ?Outcome: Progressing ?Goal: Respiratory complications will improve ?Outcome: Progressing ?Goal: Cardiovascular complication will be avoided ?Outcome: Progressing ?  ?Problem: Activity: ?Goal: Risk for activity intolerance will decrease ?Outcome: Progressing ?  ?Problem: Nutrition: ?Goal: Adequate nutrition will be maintained ?Outcome: Progressing ?  ?Problem: Education: ?Goal: Knowledge of disease or condition will improve ?Outcome: Progressing ?Goal: Knowledge of secondary prevention will improve (SELECT ALL) ?Outcome: Progressing ?Goal: Knowledge of patient specific risk factors will improve (INDIVIDUALIZE FOR PATIENT) ?Outcome: Progressing ?Goal: Individualized Educational Video(s) ?Outcome: Progressing ?  ?Problem: Coping: ?Goal: Will verbalize positive feelings about self ?Outcome: Progressing ?Goal: Will identify appropriate support needs ?Outcome: Progressing ?  ?Problem: Self-Care: ?Goal: Ability to participate in self-care as condition permits will improve ?Outcome: Progressing ?Goal: Verbalization of feelings and concerns over difficulty with self-care will improve ?Outcome: Progressing ?Goal: Ability to communicate needs accurately will improve ?Outcome: Progressing ?  ?Problem: Nutrition: ?Goal: Risk of aspiration will decrease ?Outcome: Progressing ?Goal: Dietary intake will improve ?Outcome: Progressing ?  ?Problem: Intracerebral Hemorrhage Tissue Perfusion: ?Goal: Complications of Intracerebral Hemorrhage will be minimized ?Outcome: Progressing ?  ?Problem: Ischemic Stroke/TIA Tissue Perfusion: ?Goal: Complications of ischemic stroke/TIA will be minimized ?Outcome: Progressing ?  ?Problem: Spontaneous Subarachnoid Hemorrhage Tissue Perfusion: ?Goal:  Complications of Spontaneous Subarachnoid Hemorrhage will be minimized ?Outcome: Progressing ?  ?

## 2022-01-23 NOTE — Progress Notes (Signed)
Subjective: ?The patient is alert and pleasant.  She looks and feels better.  She still has some dizziness. ? ?Objective: ?Vital signs in last 24 hours: ?Temp:  [97 ?F (36.1 ?C)-98.5 ?F (36.9 ?C)] 97.7 ?F (36.5 ?C) (05/11 0747) ?Pulse Rate:  [75-93] 90 (05/11 0747) ?Resp:  [14-19] 18 (05/11 0747) ?BP: (108-141)/(60-82) 115/74 (05/11 0747) ?SpO2:  [93 %-99 %] 99 % (05/11 0747) ?Estimated body mass index is 22.79 kg/m? as calculated from the following: ?  Height as of this encounter: 5' 2.75" (1.594 m). ?  Weight as of this encounter: 57.9 kg. ? ? ?Intake/Output from previous day: ?05/10 0701 - 05/11 0700 ?In: 480 [P.O.:480] ?Out: 400 [Urine:400] ?Intake/Output this shift: ?No intake/output data recorded. ? ?Physical exam the patient is alert and oriented.  Her speech and strength is normal. ? ?Lab Results: ?Recent Labs  ?  01/20/22 ?1359  ?WBC 9.6  ?HGB 15.9*  ?HCT 49.1*  ?PLT 311  ? ?BMET ?Recent Labs  ?  01/20/22 ?1359  ?NA 138  ?K 4.1  ?CL 102  ?CO2 27  ?GLUCOSE 119*  ?BUN 9  ?CREATININE 0.62  ?CALCIUM 9.2  ? ? ?Studies/Results: ?ECHOCARDIOGRAM COMPLETE ? ?Result Date: 01/22/2022 ?   ECHOCARDIOGRAM REPORT   Patient Name:   Molly Patterson Date of Exam: 01/22/2022 Medical Rec #:  [id number]          Height:       62.8 in Accession #:    [id number]         Weight:       127.6 lb Date of Birth:  1932/08/29          BSA:          1.593 m? Patient Age:    88 years           BP:           112/68 mmHg Patient Gender: F                  HR:           84 bpm. Exam Location:  Inpatient Procedure: 2D Echo, Color Doppler and Limited Color Doppler Indications:    Stroke  History:        Patient has no prior history of Echocardiogram examinations.  Sonographer:    Joette Catching RCS Referring Phys: [id number] Parker E DE LA [place]  1. Left ventricular ejection fraction, by estimation, is 60 to 65%. The left ventricle has normal function. The left ventricle has no regional wall motion abnormalities. Left ventricular  diastolic parameters are consistent with Grade I diastolic dysfunction (impaired relaxation).  2. Right ventricular systolic function is normal. The right ventricular size is normal.  3. The mitral valve is normal in structure. Trivial mitral valve regurgitation.  4. The aortic valve is tricuspid. There is mild calcification of the aortic valve. There is mild thickening of the aortic valve. Aortic valve regurgitation is trivial. Aortic valve sclerosis/calcification is present, without any evidence of aortic stenosis.  5. The inferior vena cava is normal in size with greater than 50% respiratory variability, suggesting right atrial pressure of 3 mmHg. Comparison(s): No prior Echocardiogram. Conclusion(s)/Recommendation(s): No intracardiac source of embolism detected on this transthoracic study. Consider a transesophageal echocardiogram to exclude cardiac source of embolism if clinically indicated. FINDINGS  Left Ventricle: Left ventricular ejection fraction, by estimation, is 60 to 65%. The left ventricle has normal function. The left ventricle has no regional wall motion abnormalities. The  left ventricular internal cavity size was normal in size. There is  no left ventricular hypertrophy. Left ventricular diastolic parameters are consistent with Grade I diastolic dysfunction (impaired relaxation). Right Ventricle: The right ventricular size is normal. No increase in right ventricular wall thickness. Right ventricular systolic function is normal. Left Atrium: Left atrial size was normal in size. Right Atrium: Right atrial size was normal in size. Pericardium: There is no evidence of pericardial effusion. Mitral Valve: The mitral valve is normal in structure. Trivial mitral valve regurgitation. Tricuspid Valve: The tricuspid valve is normal in structure. Tricuspid valve regurgitation is trivial. Aortic Valve: The aortic valve is tricuspid. There is mild calcification of the aortic valve. There is mild thickening of  the aortic valve. Aortic valve regurgitation is trivial. Aortic valve sclerosis/calcification is present, without any evidence of aortic stenosis. Aortic valve mean gradient measures 6.0 mmHg. Aortic valve peak gradient measures 10.8 mmHg. Aortic valve area, by VTI measures 1.54 cm?. Pulmonic Valve: The pulmonic valve was not well visualized. Pulmonic valve regurgitation is trivial. Aorta: The aortic root and ascending aorta are structurally normal, with no evidence of dilitation. Venous: The inferior vena cava is normal in size with greater than 50% respiratory variability, suggesting right atrial pressure of 3 mmHg. IAS/Shunts: The atrial septum is grossly normal.  LEFT VENTRICLE PLAX 2D LVIDd:         5.20 cm   Diastology LVIDs:         3.50 cm   LV e' medial:    4.35 cm/s LV PW:         0.70 cm   LV E/e' medial:  11.2 LV IVS:        0.60 cm   LV e' lateral:   7.18 cm/s LVOT diam:     1.70 cm   LV E/e' lateral: 6.8 LV SV:         47 LV SV Index:   30 LVOT Area:     2.27 cm?  RIGHT VENTRICLE             IVC RV Basal diam:  3.90 cm     IVC diam: 1.40 cm RV Mid diam:    3.00 cm RV S prime:     21.90 cm/s TAPSE (M-mode): 2.2 cm LEFT ATRIUM             Index        RIGHT ATRIUM           Index LA diam:        3.10 cm 1.95 cm/m?   RA Area:     12.10 cm? LA Vol (A2C):   25.1 ml 15.75 ml/m?  RA Volume:   26.40 ml  16.57 ml/m? LA Vol (A4C):   38.3 ml 24.04 ml/m? LA Biplane Vol: 32.0 ml 20.08 ml/m?  AORTIC VALVE                     PULMONIC VALVE AV Area (Vmax):    1.44 cm?      PV Vmax:       0.84 m/s AV Area (Vmean):   1.45 cm?      PV Peak grad:  2.8 mmHg AV Area (VTI):     1.54 cm? AV Vmax:           164.00 cm/s AV Vmean:          121.000 cm/s AV VTI:  0.306 m AV Peak Grad:      10.8 mmHg AV Mean Grad:      6.0 mmHg LVOT Vmax:         104.00 cm/s LVOT Vmean:        77.100 cm/s LVOT VTI:          0.208 m LVOT/AV VTI ratio: 0.68  AORTA Ao Root diam: 3.10 cm Ao Asc diam:  3.20 cm MITRAL VALVE                TRICUSPID VALVE MV Area (PHT): 8.25 cm?    TR Peak grad:   27.2 mmHg MV Decel Time: 92 msec     TR Vmax:        261.00 cm/s MV E velocity: 48.80 cm/s MV A velocity: 83.60 cm/s  SHUNTS MV E/A ratio:  0.58        Systemic VTI:  0.21 m                            Systemic Diam: 1.70 cm Gwyndolyn Kaufman MD Electronically signed by Gwyndolyn Kaufman MD Signature Date/Time: 01/22/2022/12:41:22 PM    Final    ? ?Assessment/Plan: ?Cerebellar hemorrhage: The patient is improving clinically. ? LOS: 3 days  ? ? ? ?Molly Patterson ?01/23/2022, 7:54 AM ? ? ? ? ?Patient ID: Molly Patterson, female   DOB: 1932-05-07, 88 y.o.   MRN: [id number] ? ?

## 2022-01-23 NOTE — Progress Notes (Signed)
? ?Molly Patterson  GEZ:[id number] DOB: 08/02/1932 DOA: 01/20/2022 ?PCP: Eulas Post, MD   ? 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  She had been evaluated at [facility] ED on 2 separate occasions with a head CT on the first visit interpreted as revealing no acute intracranial process.  She did not have MRI imaging.  Due to persistence of her symptoms she presented to [facility] 5/8 at which time CT of the head revealed a small right cerebellar intraparenchymal hemorrhage.  ?She ?Consultants:  ?Neurology ?Neurosurgery ? ?Goals of Care:  Code Status: Full Code  ? ?DVT prophylaxis: ?SCDs ? ?Interim Hx: ?Blood pressure well controlled.  Vital signs otherwise stable.  I am assuming care of this patient from the stroke team as of today.  She is resting comfortably in bed.  She is in good spirits.  She has no new complaints.  She denies chest pain or shortness of breath.  She states she does still feel dizzy and somewhat unstable on her feet. ? ?Assessment & Plan: ? ?Right cerebellar intraparenchymal hemorrhage in setting of uncontrolled hypertension ?CT head small acute IPH in right cerebellum ?CTA head & neck No LVO, aneurysm or vascular malformation, severe right P2 stenosis ?MRI  stable right cerebellar IPH, mild chronic microvascular disease ?2D Echo noted EF 60-65% with normal LV function and no WMA with grade 1 DD and no evidence of an intracardiac source of embolism ?LDL 125 ?HgbA1c 5.9 ?Consider repeat MRI without contrast in 2-3 months to rule out underlying structural or vascular lesions ? ?Uncontrolled hypertension ?Was previously on HCTZ alone -required Cleviprex during initial portion of hospital stay in setting of IPH -goal is strict blood pressure control with systolics less than 650 -blood pressure presently at goal ? ?HLD ?Goal is LDL less than 70 -  continue statin at discharge ? ?Family Communication: Spoke with husband and one of her sons at bedside at length ?Disposition: Awaiting CIR bed ? ? ?Objective: ?Blood pressure 115/74, pulse 90, temperature 97.7 ?F (36.5 ?C), temperature source Oral, resp. rate 18, height 5' 2.75" (1.594 m), weight 57.9 kg, SpO2 99 %. ? ?Intake/Output Summary (Last 24 hours) at 01/23/2022 0951 ?Last data filed at 01/22/2022 2344 ?Gross per 24 hour  ?Intake 480 ml  ?Output 400 ml  ?Net 80 ml  ? ?Filed Weights  ? 01/20/22 1331  ?Weight: 57.9 kg  ? ? ?Examination: ?General: No acute respiratory distress ?Lungs: Clear to auscultation bilaterally without wheezes or crackles ?Cardiovascular: Regular rate and rhythm without murmur gallop or rub normal S1 and S2 ?Abdomen: Nontender, nondistended, soft, bowel sounds positive, no rebound, no ascites, no appreciable mass ?Extremities: No significant cyanosis, clubbing, or edema bilateral lower extremities ? ?CBC: ?Recent Labs  ?Lab 01/20/22 ?1359  ?WBC 9.6  ?NEUTROABS 7.6  ?HGB 15.9*  ?HCT 49.1*  ?MCV 89.8  ?PLT 311  ? ?Basic Metabolic Panel: ?Recent Labs  ?Lab 01/20/22 ?1359  ?NA 138  ?K 4.1  ?CL 102  ?CO2 27  ?GLUCOSE 119*  ?BUN 9  ?CREATININE 0.62  ?CALCIUM 9.2  ? ?GFR: ?Estimated Creatinine Clearance: 39.7 mL/min (by C-G formula based on SCr of 0.62 mg/dL). ? ?Liver Function Tests: ?Recent Labs  ?Lab 01/20/22 ?1359  ?AST 17  ?ALT 15  ?ALKPHOS 70  ?BILITOT 0.6  ?PROT 6.7  ?ALBUMIN 3.6  ? ? ?HbA1C: ?Hgb A1c MFr  Bld  ?Date/Time Value Ref Range Status  ?01/22/2022 01:28 AM 5.9 (H) 4.8 - 5.6 % Final  ?  Comment:  ?  (NOTE) ?Pre diabetes:          5.7%-6.4% ? ?Diabetes:              >6.4% ? ?Glycemic control for   <7.0% ?adults with diabetes ?  ? ? ?Scheduled Meds: ? amLODipine  10 mg Oral Daily  ? benazepril  10 mg Oral Daily  ? feeding supplement  237 mL Oral BID BM  ? hydrochlorothiazide  12.5 mg Oral Daily  ? pantoprazole  40 mg Oral QHS  ? ? ? LOS: 3 days  ? ?Cherene Altes, MD ?[facility] ?Office  [phone] ?Pager - Text Page per Shea Evans ? ?If 7PM-7AM, please contact night-coverage per Amion ?01/23/2022, 9:51 AM ? ? ? ? ?

## 2022-01-23 NOTE — Progress Notes (Signed)
STROKE TEAM PROGRESS NOTE  ? ?INTERVAL HISTORY ?Patient is seen in her room with her family at the bedside.  She reports no complaints today.   Neurological exam is nonfocal.  Therapist recommend inpatient rehab.  Blood pressure adequately controlled.  Patient is still unsteady when she is trying to walk. ? ?Vitals:  ? 01/22/22 2344 01/23/22 0412 01/23/22 0747 01/23/22 1206  ?BP: 112/65 108/63 115/74 110/79  ?Pulse: 85 79 90 97  ?Resp: '18 18 18 15  '$ ?Temp: 98.5 ?F (36.9 ?C) 98.1 ?F (36.7 ?C) 97.7 ?F (36.5 ?C) 97.7 ?F (36.5 ?C)  ?TempSrc: Oral Oral Oral Axillary  ?SpO2: 98% 97% 99% 93%  ?Weight:      ?Height:      ? ?CBC:  ?Recent Labs  ?Lab 01/20/22 ?1359  ?WBC 9.6  ?NEUTROABS 7.6  ?HGB 15.9*  ?HCT 49.1*  ?MCV 89.8  ?PLT 311  ? ?Basic Metabolic Panel:  ?Recent Labs  ?Lab 01/20/22 ?1359  ?NA 138  ?K 4.1  ?CL 102  ?CO2 27  ?GLUCOSE 119*  ?BUN 9  ?CREATININE 0.62  ?CALCIUM 9.2  ? ?Lipid Panel:  ?Recent Labs  ?Lab 01/22/22 ?0128  ?CHOL 187  ?TRIG 83  ?HDL 45  ?CHOLHDL 4.2  ?VLDL 17  ?LDLCALC 125*  ? ?HgbA1c:  ?Recent Labs  ?Lab 01/22/22 ?0128  ?HGBA1C 5.9*  ? ?Urine Drug Screen: No results for input(s): LABOPIA, COCAINSCRNUR, LABBENZ, AMPHETMU, THCU, LABBARB in the last 168 hours.  ?Alcohol Level No results for input(s): ETH in the last 168 hours. ? ?IMAGING past 24 hours ?No results found. ? ?PHYSICAL EXAM ?General:  Alert, thin-appearing elderly patient in no acute distress ?Respiratory:  Regular, unlabored respirations on room air ? ?NEURO:  ?Mental Status: AA&Ox3  ?Speech/Language: speech is without dysarthria or aphasia.  Fluency, and comprehension intact. ? ?Cranial Nerves:  ?II: PERRL. Visual fields full.  ?III, IV, VI: EOMI with left sided dysmetric saccades. Eyelids elevate symmetrically.  ?V: Sensation is intact to light touch and symmetrical to face.  ?VII: Smile is symmetrical.  ?VIII: hearing intact to voice. ?IX, X: Phonation is normal.  ?XL:KGMWNUUV shrug 5/5. ?XII: tongue is midline without  fasciculations. ?Motor: 5/5 strength to all muscle groups tested.  ?Sensation- Intact to light touch bilaterally.  ?Coordination: FTN intact bilaterally, HKS: no ataxia in BLE.No drift.  ?Gait- deferred ? ? ?ASSESSMENT/PLAN ?Ms. JOCI DRESS is a 88 y.o. female with history of HTN and osteoarthritis presenting with  severe dizziness with nausea and vomiting for about ten days for which she was seen twice in the ED prior to this admission.  CT head performed on admission here shows a small right cerebellar IPH. ? ?IPH:  right cerebellar IPH in setting of uncontrolled hypertension ?CT head Small acute IPH in right cerebellum ?CTA head & neck No LVO, aneurysm or vascular malformation, severe right P2 stenosis ?MRI  stable right cerebellar IPH, mild chronic microvascular disease ?2D Echo pending ?LDL No results found for requested labs within last 26280 hours. ?HgbA1c No results found for requested labs within last 26280 hours. ?VTE prophylaxis - SCDs ?   ?Diet  ? Diet Heart Room service appropriate? Yes; Fluid consistency: Thin  ? ?No antithrombotic prior to admission, now on No antithrombotic secondary to IPH ?Therapy recommendations:  CIR ?Disposition:  pending ? ?Hypertension ?Home meds:  HCTZ 12.5 mg daily ?Stable, no longer requiring Cleviprex ?PRN labetalol and hydralazine ?Keep SBP 130-150 for first 24 hours, then <160 ?Long-term BP goal normotensive ? ?Hyperlipidemia ?  Home meds:  none ?LDL No results found for requested labs within last 26280 hours., goal < 70 ?Add statin at discharge if indicated  ?Continue statin at discharge ? ?Risk for Diabetes type II  ?Home meds:  none ?HgbA1c pending., goal < 7.0 ?CBGs ?No results for input(s): GLUCAP in the last 72 hours.  ?SSI ? ?Other Stroke Risk Factors ?Advanced Age >/= 47  ? ?Other Active Problems ?none ? ?Hospital day # 3 ? ?Patient presented with several days of nausea vomiting and was seen in the ER twice in ED with negative brain imaging CT scan yesterday  showed a small right cerebellar parenchymal hemorrhage and MRI scan this morning shows stable appearance of the hemorrhage without any underlying structural or vascular lesions.  Neurological exam is fairly nonfocal.  Recommend mobilize out of bed.  Transfer to neurology floor bed when available.  Will ask medical hospitalist team to resume care after she leaves ICU.  Continue strict blood pressure control with systolic goal below 098.  Resumed home blood pressure medications and use as needed IV labetalol and hydralazine   May need repeat MRI scan with and without contrast in 2 to 3 months rule out any underlying structural or vascular lesion.  Discussed with patient and family at the bedside and answered questions.  Transferred to inpatient rehab when bed available hopefully in the next few days.  Greater than 50% time during the 35-minute visit was spent on counseling and coordination of care about cerebellar hemorrhage and gait imbalance and answering questions. ?  ? ? ?Antony Contras, MD ?Medical Director ?[facility] ?Pager: [phone] ?01/23/2022 1:31 PM ? ?To contact Stroke Continuity provider, please refer to [email or web address] ?After hours, contact General Neurology  ?

## 2022-01-24 ENCOUNTER — Telehealth: Payer: Self-pay | Admitting: Family Medicine

## 2022-01-24 DIAGNOSIS — I629 Nontraumatic intracranial hemorrhage, unspecified: Secondary | ICD-10-CM | POA: Diagnosis not present

## 2022-01-24 DIAGNOSIS — R42 Dizziness and giddiness: Secondary | ICD-10-CM | POA: Diagnosis not present

## 2022-01-24 DIAGNOSIS — I614 Nontraumatic intracerebral hemorrhage in cerebellum: Secondary | ICD-10-CM | POA: Diagnosis not present

## 2022-01-24 MED ORDER — ROSUVASTATIN CALCIUM 5 MG PO TABS
10.0000 mg | ORAL_TABLET | Freq: Every day | ORAL | Status: DC
  Administered 2022-01-27: 10 mg via ORAL
  Filled 2022-01-24 (×4): qty 2

## 2022-01-24 NOTE — Progress Notes (Signed)
Occupational Therapy Treatment ?Patient Details ?Patterson: Molly Patterson ?MRN: [id number] ?DOB: 07-19-1932 ?Today's Date: 01/24/2022 ? ? ?History of present illness 88 yo female who has been having dizziness and nausea with vomiting. She presented to [facility] 2x with first CT neg. CT at [facility] showed small acute IPH R cerebellum. PMH: HTN, OA, Raynaud's. ?  ?OT comments ? Pt progressing towards established OT goals. Continues to present with dizziness with positional changes and decreased balance. Pt performing two "laps" from bed to door in room with Min Guard-Min A and RW. Pt then performing grooming at sink and toileting with Min guard A-Min A. Pt very motivated to participate in therapy. Continues to required standing rest breaks to gain balance and locate new gaze stabilizing spot. Family arriving at end of session and confirm increased support. Continue to recommend dc to AIR and will continue to follow acutely as admitted.   ? ?Recommendations for follow up therapy are one component of a multi-disciplinary discharge planning process, led by the attending physician.  Recommendations may be updated based on patient status, additional functional criteria and insurance authorization. ?   ?Follow Up Recommendations ? Acute inpatient rehab (3hours/day)  ?  ?Assistance Recommended at Discharge Frequent or constant Supervision/Assistance  ?Patient can return home with the following ? A little help with walking and/or transfers ?  ?Equipment Recommendations ? Other (comment) (Pending progress)  ?  ?Recommendations for Other Services Rehab consult ? ?  ?Precautions / Restrictions Precautions ?Precautions: Fall ?Precaution Comments: dizzy but nausea was not occurring today  ? ? ?  ? ?Mobility Bed Mobility ?Overal bed mobility: Needs Assistance ?Bed Mobility: Supine to Sit, Sit to Supine ?  ?  ?Supine to sit: Min guard ?Sit to supine: Min guard ?  ?General bed mobility comments: Min Guard A for safety and increased  time ?  ? ?Transfers ?Overall transfer level: Needs assistance ?Equipment used: Rolling walker (2 wheels) ?Transfers: Sit to/from Stand ?Sit to Stand: Min guard ?  ?  ?  ?  ?  ?General transfer comment: Min Guard A for safety ?  ?  ?Balance Overall balance assessment: Needs assistance ?Sitting-balance support: No upper extremity supported, Feet supported ?Sitting balance-Leahy Scale: Fair ?  ?  ?Standing balance support: Bilateral upper extremity supported, During functional activity ?Standing balance-Leahy Scale: Poor ?Standing balance comment: reliant on UE support ?  ?  ?  ?  ?  ?  ?  ?  ?  ?  ?  ?   ? ?ADL either performed or assessed with clinical judgement  ? ?ADL Overall ADL's : Needs assistance/impaired ?  ?  ?Grooming: Min guard;Wash/dry face;Standing;Wash/dry hands ?  ?  ?  ?  ?  ?  ?  ?  ?  ?Toilet Transfer: Min guard;Ambulation;Rolling walker (2 wheels);Grab bars;Regular Toilet ?  ?Toileting- Clothing Manipulation and Hygiene: Minimal assistance;Sit to/from stand ?Toileting - Clothing Manipulation Details (indicate cue type and reason): Min A for balance and managing gown ?  ?  ?Functional mobility during ADLs: Min guard;Rolling walker (2 wheels);Minimal assistance ?General ADL Comments: Pt continues to present with decreased balance due to dizziness. Very motivated. Pt performing two "laps" in room, grooming, and toileting. Requiring standing breaks to gain her balance and find a spot to focus on for dizziness ?  ? ?Extremity/Trunk Assessment Upper Extremity Assessment ?Upper Extremity Assessment: Generalized weakness ?  ?Lower Extremity Assessment ?Lower Extremity Assessment: Defer to PT evaluation ?  ?  ?  ? ?Vision   ?  ?  ?  Perception   ?  ?Praxis   ?  ? ?Cognition Arousal/Alertness: Awake/alert ?Behavior During Therapy: [facility] for tasks assessed/performed ?Overall Cognitive Status: Within Functional Limits for tasks assessed ?  ?  ?  ?  ?  ?  ?  ?  ?  ?  ?  ?  ?  ?  ?  ?  ?  ?  ?  ?   ?Exercises   ? ?   ?Shoulder Instructions   ? ? ?  ?General Comments VSS  ? ? ?Pertinent Vitals/ Pain       Pain Assessment ?Pain Assessment: Faces ?Faces Pain Scale: No hurt ?Pain Intervention(s): Monitored during session, Limited activity within patient's tolerance, Repositioned ? ?Home Living   ?  ?  ?  ?  ?  ?  ?  ?  ?  ?  ?  ?  ?  ?  ?  ?  ?  ?  ? ?  ?Prior Functioning/Environment    ?  ?  ?  ?   ? ?Frequency ? Min 2X/week  ? ? ? ? ?  ?Progress Toward Goals ? ?OT Goals(current goals can now be found in the care plan section) ? Progress towards OT goals: Progressing toward goals ? ?Acute Rehab OT Goals ?OT Goal Formulation: With patient ?Time For Goal Achievement: 02/04/22 ?Potential to Achieve Goals: Good ?ADL Goals ?Pt Will Perform Grooming: with supervision;standing ?Pt Will Perform Lower Body Bathing: with supervision;sit to/from stand ?Pt Will Perform Lower Body Dressing: with supervision;sit to/from stand ?Pt Will Transfer to Toilet: with supervision;ambulating ?Pt Will Perform Tub/Shower Transfer: with min guard assist;ambulating ?Additional ADL Goal #1: Pt will indep complete bed mobility as a precursor to OOB ADLs  ?Plan Discharge plan remains appropriate   ? ?Co-evaluation ? ? ?   ?  ?  ?  ?  ? ?  ?AM-PAC OT "6 Clicks" Daily Activity     ?Outcome Measure ? ? Help from another person eating meals?: None ?Help from another person taking care of personal grooming?: A Little ?Help from another person toileting, which includes using toliet, bedpan, or urinal?: A Lot ?Help from another person bathing (including washing, rinsing, drying)?: A Lot ?Help from another person to put on and taking off regular upper body clothing?: A Little ?Help from another person to put on and taking off regular lower body clothing?: A Lot ?6 Click Score: 16 ? ?  ?End of Session Equipment Utilized During Treatment: Rolling walker (2 wheels);Gait belt ? ?OT Visit Diagnosis: Unsteadiness on feet (R26.81);Other abnormalities of gait and mobility  (R26.89);Muscle weakness (generalized) (M62.81);Dizziness and giddiness (R42) ?  ?Activity Tolerance Patient tolerated treatment well ?  ?Patient Left in bed;with call bell/phone within reach;with bed alarm set;with family/visitor present ?  ?Nurse Communication Mobility status ?  ? ?   ? ?Time: 4680-3212 ?OT Time Calculation (min): 36 min ? ?Charges: OT General Charges ?$OT Visit: 1 Visit ?OT Treatments ?$Self Care/Home Management : 23-37 mins ? ?Minnetta Sandora MSOT, OTR/L ?Acute Rehab ?Pager: [phone] ?Office: [phone] ? ?Thomasenia Dowse M Natonya Finstad ?01/24/2022, 1:40 PM ? ? ?

## 2022-01-24 NOTE — Progress Notes (Signed)
Inpatient Rehabilitation Admissions Coordinator  ? ?I do not have a CIR bed to admit her to today. I met with patient , spouse and son at bedside and they are aware. TOC made aware that son is asking about other rehab venues beds. ? ?Danne Baxter, RN, MSN ?Rehab Admissions Coordinator ?[phone] ?01/24/2022 12:02 PM ? ?

## 2022-01-24 NOTE — TOC Initial Note (Signed)
Transition of Care (TOC) - Initial/Assessment Note  ? ? ?Patient Details  ?Name: Molly Patterson ?MRN: [id number] ?Date of Birth: [birth year] ? ?Transition of Care (TOC) CM/SW Contact:    ?Pollie Friar, RN ?Phone Number: ?01/24/2022, 4:05 PM ? ?Clinical Narrative:                 ?Patient approved for CIR and has insurance auth. CIR wont have a bed over the weekend. CM was updated from CIR that family was interested in seeing if another IR had a bed. CM called [facility] and [facility] and they also do not have bed availability until next week. CM has updated the patient. CM attempted to call the spouse without an answer.  ?CM has updated CIR.  ?TOC following. ? ?Expected Discharge Plan: [place] ?Barriers to Discharge: Continued Medical Work up ? ? ?Patient Goals and CMS Choice ?  ?CMS Medicare.gov Compare Post Acute Care list provided to:: Patient ?Choice offered to / list presented to : Patient, Spouse, Adult Children ? ?Expected Discharge Plan and Services ?Expected Discharge Plan: [place] ?  ?Discharge Planning Services: CM Consult ?Post Acute Care Choice: IP Rehab ?Living arrangements for the past 2 months: [place] ?                ?  ?  ?  ?  ?  ?  ?  ?  ?  ?  ? ?Prior Living Arrangements/Services ?Living arrangements for the past 2 months: [place] ?Lives with:: Spouse ?Patient language and need for interpreter reviewed:: Yes ?Do you feel safe going back to the place where you live?: Yes      ?  ?Care giver support system in place?: Yes (comment) ?  ?Criminal Activity/Legal Involvement Pertinent to Current Situation/Hospitalization: No - Comment as needed ? ?Activities of Daily Living ?Home Assistive Devices/Equipment: Eyeglasses, Hearing aid ?ADL Screening (condition at time of admission) ?Patient's cognitive ability adequate to safely complete daily activities?: Yes ?Is the patient deaf or have difficulty hearing?: Yes ?Does the patient have difficulty seeing, even when  wearing glasses/contacts?: No ?Does the patient have difficulty concentrating, remembering, or making decisions?: No ?Patient able to express need for assistance with ADLs?: Yes ?Does the patient have difficulty dressing or bathing?: No ?Independently performs ADLs?: Yes (appropriate for developmental age) ?Does the patient have difficulty walking or climbing stairs?: No ?Weakness of Legs: None ?Weakness of Arms/Hands: None ? ?Permission Sought/Granted ?  ?  ?   ?   ?   ?   ? ?Emotional Assessment ?Appearance:: Appears stated age ?Attitude/Demeanor/Rapport: Engaged ?Affect (typically observed): Accepting ?Orientation: : Oriented to Self, Oriented to Place, Oriented to  Time, Oriented to Situation ?  ?Psych Involvement: No (comment) ? ?Admission diagnosis:  Vertigo [R42] ?Intracranial hemorrhage ([place]) [I62.9] ?ICH (intracerebral hemorrhage) ([place]) [I61.9] ?Patient Active Problem List  ? Diagnosis Date Noted  ? ICH (intracerebral hemorrhage) ([place]) 01/20/2022  ? RAYNAUD'S SYNDROME 08/12/2010  ? Osteoarthritis, multiple sites 08/12/2010  ? ANEMIA-IRON DEFICIENCY 08/02/2009  ? Essential hypertension 08/02/2009  ? BURSITIS, HIP 08/02/2009  ? ?PCP:  Eulas Post, MD ?Pharmacy:   ?[facility] - [place], [place] ?[address] ?[place] [address] ?Phone: [phone] Fax: [phone] ? ? ? ? ?Social Determinants of Health (SDOH) Interventions ?  ? ?Readmission Risk Interventions ?   ? View : No data to display.  ?  ?  ?  ? ? ? ?

## 2022-01-24 NOTE — Progress Notes (Addendum)
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  She had been evaluated at [facility] ED on 2 separate occasions with a head CT on the first visit interpreted as revealing no acute intracranial process.  She did not have MRI imaging.  Due to persistence of her symptoms she presented to [facility] 5/8 at which time CT of the head revealed a small right cerebellar intraparenchymal hemorrhage.  She Consultants:  Neurology Neurosurgery  Goals of Care:  Code Status: Full Code   DVT prophylaxis: SCDs  Interim Hx: Patient is experiencing ongoing dizzy spells causing poor appetite and some gait instability.  Blood pressure is controlled.  Vitals are otherwise stable.  No other new complaints.  Remains motivated to participate in rehab.  Denies chest pain or shortness of breath  Assessment & Plan:  Right cerebellar intraparenchymal hemorrhage in setting of uncontrolled hypertension CT head small acute IPH in right cerebellum CTA head & neck No LVO, aneurysm or vascular malformation, severe right P2 stenosis MRI  stable right cerebellar IPH, mild chronic microvascular disease 2D Echo noted EF 60-65% with normal LV function and no WMA with grade 1 DD and no evidence of an intracardiac source of embolism LDL 125 HgbA1c 5.9 Consider repeat MRI without contrast in 2-3 months to rule out underlying structural or vascular lesions  Uncontrolled hypertension - Malignant HTN (end organ damage) Was previously on HCTZ alone -required Cleviprex during initial portion of hospital stay in setting of IPH -goal is strict blood pressure control with systolics less than 371 -blood pressure presently at goal  HLD Goal is LDL less than 70 - continue statin at discharge  Family  Communication: No family present at time of exam today Disposition: Awaiting CIR bed -medically stable for discharge when bed identified   Objective: Blood pressure 113/71, pulse 75, temperature 98.7 F (37.1 C), temperature source Oral, resp. rate 16, height 5' 2.75" (1.594 m), weight 57.9 kg, SpO2 95 %.  Intake/Output Summary (Last 24 hours) at 01/24/2022 0802 Last data filed at 01/24/2022 0626 Gross per 24 hour  Intake --  Output 825 ml  Net -825 ml    Filed Weights   01/20/22 1331  Weight: 57.9 kg    Examination: General: No acute respiratory distress Lungs: Clear to auscultation bilaterally without wheezes or crackles Cardiovascular: RRR without murmur Abdomen: NT/ND, soft, BS positive Extremities: No significant cyanosis, clubbing, or edema B LE  CBC: Recent Labs  Lab 01/20/22 1359  WBC 9.6  NEUTROABS 7.6  HGB 15.9*  HCT 49.1*  MCV 89.8  PLT 948    Basic Metabolic Panel: Recent Labs  Lab 01/20/22 1359  NA 138  K 4.1  CL 102  CO2 27  GLUCOSE 119*  BUN 9  CREATININE 0.62  CALCIUM 9.2    GFR: Estimated Creatinine Clearance: 39.7 mL/min (by C-G formula based on SCr of 0.62 mg/dL).  Liver Function Tests: Recent Labs  Lab 01/20/22 1359  AST 17  ALT 15  ALKPHOS 70  BILITOT 0.6  PROT 6.7  ALBUMIN 3.6    Scheduled Meds:  amLODipine  10 mg Oral Daily   benazepril  10 mg Oral Daily   cholecalciferol  1,000 Units Oral Daily   feeding supplement  237 mL Oral BID BM  hydrochlorothiazide  12.5 mg Oral Daily   multivitamin with minerals  1 tablet Oral Daily   pantoprazole  40 mg Oral QHS   vitamin B-12  1,000 mcg Oral Daily     LOS: 4 days   Cherene Altes, MD [facility] Office  [phone] Pager - Text Page per Shea Evans  If 7PM-7AM, please contact night-coverage per Amion 01/24/2022, 8:02 AM

## 2022-01-24 NOTE — Telephone Encounter (Signed)
Pt husband called stating that pt has been admitted to ICU and wanted the Dr. to be aware.  ? ?FYI. ?

## 2022-01-24 NOTE — Progress Notes (Signed)
Physical Therapy Treatment ?Patient Details ?Name: Molly Patterson ?MRN: [id number] ?DOB: [birth year] ?Today's Date: 01/24/2022 ? ? ?History of Present Illness 88 yo female who has been having dizziness and nausea with vomiting. She presented to [facility] 2x with first CT neg. CT at [facility] showed small acute IPH R cerebellum. PMH: HTN, OA, Raynaud's. ? ?  ?PT Comments  ? ? Pt was seen for progression of therapy goals, with pt agreeing to do bed ex due to fatigue from being up to walk mult times today.  Pt is still minimally dizzy but not nauseated again.  Pt is mainly concerning for her balance in standing, with visual stabilization done with objects in the room.  Pt is also using RW vs no AD, and is still requiring assist regardless.  Follow her for balance and gait work, focusing on tstrategies to stabilize, with correction with integration of motor control techniques. Increase her awareness of the balance mistakes to improve motor learning.  ?Recommendations for follow up therapy are one component of a multi-disciplinary discharge planning process, led by the attending physician.  Recommendations may be updated based on patient status, additional functional criteria and insurance authorization. ? ?Follow Up Recommendations ? Acute inpatient rehab (3hours/day) ?  ?  ?Assistance Recommended at Discharge Frequent or constant Supervision/Assistance  ?Patient can return home with the following A lot of help with walking and/or transfers;A little help with bathing/dressing/bathroom;Assistance with cooking/housework;Assist for transportation;Help with stairs or ramp for entrance ?  ?Equipment Recommendations ? Rolling walker (2 wheels)  ?  ?Recommendations for Other Services Rehab consult ? ? ?  ?Precautions / Restrictions Precautions ?Precautions: Fall ?Precaution Comments: no nausea or dizziness today  ?  ? ?Mobility ? Bed Mobility ?Overal bed mobility: Needs Assistance ?Bed Mobility: Rolling (scooting up  bed) ?Rolling: Supervision ?  ?  ?  ?  ?General bed mobility comments: declined OOB ?  ? ?Transfers ?  ?  ?  ?  ?  ?  ?  ?  ?  ?  ?  ? ?Ambulation/Gait ?  ?  ?  ?  ?  ?  ?  ?  ? ? ?Stairs ?  ?  ?  ?  ?  ? ? ?Wheelchair Mobility ?  ? ?Modified Rankin (Stroke Patients Only) ?  ? ? ?  ?Balance   ?  ?  ?  ?  ?  ?  ?  ?  ?  ?  ?  ?  ?  ?  ?  ?  ?  ?  ?  ? ?  ?Cognition Arousal/Alertness: Awake/alert ?Behavior During Therapy: [facility] for tasks assessed/performed ?Overall Cognitive Status: Within Functional Limits for tasks assessed ?  ?  ?  ?  ?  ?  ?  ?  ?  ?  ?  ?  ?  ?  ?  ?  ?  ?  ?  ? ?  ?Exercises General Exercises - Lower Extremity ?Ankle Circles/Pumps: AROM, AAROM, 5 reps ?Quad Sets: AROM, 10 reps ?Gluteal Sets: AROM, 10 reps ?Long Arc Quad: AROM, 10 reps ?Heel Slides: AROM, 10 reps ?Hip ABduction/ADduction: AROM, 10 reps ?Straight Leg Raises: AROM, 10 reps ? ?  ?General Comments General comments (skin integrity, edema, etc.): no concerns for HR or sats wtih exercises ?  ?  ? ?Pertinent Vitals/Pain Pain Assessment ?Pain Assessment: Faces ?Pain Score: 0-No pain ?Faces Pain Scale: No hurt  ? ? ?Home Living   ?  ?  ?  ?  ?  ?  ?  ?  ?  ?   ?  ?  Prior Function    ?  ?  ?   ? ?PT Goals (current goals can now be found in the care plan section) Acute Rehab PT Goals ?Patient Stated Goal: no dizziness, return home ?Progress towards PT goals: Progressing toward goals ? ?  ?Frequency ? ? ? Min 4X/week ? ? ? ?  ?PT Plan Current plan remains appropriate  ? ? ?Co-evaluation   ?  ?  ?  ?  ? ?  ?AM-PAC PT "6 Clicks" Mobility   ?Outcome Measure ? Help needed turning from your back to your side while in a flat bed without using bedrails?: A Little ?Help needed moving from lying on your back to sitting on the side of a flat bed without using bedrails?: A Little ?Help needed moving to and from a bed to a chair (including a wheelchair)?: A Little ?Help needed standing up from a chair using your arms (e.g., wheelchair or bedside chair)?: A  Little ?Help needed to walk in hospital room?: A Little ?Help needed climbing 3-5 steps with a railing? : A Lot ?6 Click Score: 17 ? ?  ?End of Session   ?Activity Tolerance: Patient tolerated treatment well ?Patient left: in bed;with call bell/phone within reach;with bed alarm set ?Nurse Communication: Mobility status ?PT Visit Diagnosis: Unsteadiness on feet (R26.81);Difficulty in walking, not elsewhere classified (R26.2);Dizziness and giddiness (R42) ?  ? ? ?Time: 0254-2706 ?PT Time Calculation (min) (ACUTE ONLY): 27 min ? ?Charges:  $Therapeutic Exercise: 8-22 mins ?$Therapeutic Activity: 8-22 mins  ?Ramond Dial ?01/24/2022, 4:53 PM ? ?Mee Hives, PT PhD ?Acute Rehab Dept. Number: [facility] [phone] and [place] [phone] ? ? ?

## 2022-01-24 NOTE — Progress Notes (Addendum)
STROKE TEAM PROGRESS NOTE  ? ?INTERVAL HISTORY ?Patient is seen in her room with no family at the bedside.  She reports no complaints today and had no acute events overnight.  Her vital signs and neurological exam are stable.  She is working with physical therapy and feels her gait and balance are improving.  She is awaiting rehab bed and insurance approval ? ?Vitals:  ? 01/23/22 2325 01/24/22 0355 01/24/22 0725 01/24/22 1233  ?BP: 110/73 97/62 113/71   ?Pulse: 96 74 75   ?Resp: '17 14 16   '$ ?Temp: 98.4 ?F (36.9 ?C) 98.4 ?F (36.9 ?C) 98.7 ?F (37.1 ?C) 98.5 ?F (36.9 ?C)  ?TempSrc: Oral Oral Oral Oral  ?SpO2: 98% 93% 95%   ?Weight:      ?Height:      ? ?CBC:  ?Recent Labs  ?Lab 01/20/22 ?1359  ?WBC 9.6  ?NEUTROABS 7.6  ?HGB 15.9*  ?HCT 49.1*  ?MCV 89.8  ?PLT 311  ? ? ?Basic Metabolic Panel:  ?Recent Labs  ?Lab 01/20/22 ?1359  ?NA 138  ?K 4.1  ?CL 102  ?CO2 27  ?GLUCOSE 119*  ?BUN 9  ?CREATININE 0.62  ?CALCIUM 9.2  ? ? ?Lipid Panel:  ?Recent Labs  ?Lab 01/22/22 ?0128  ?CHOL 187  ?TRIG 83  ?HDL 45  ?CHOLHDL 4.2  ?VLDL 17  ?Two Strike 125*  ? ? ?HgbA1c:  ?Recent Labs  ?Lab 01/22/22 ?0128  ?HGBA1C 5.9*  ? ? ?Urine Drug Screen: No results for input(s): LABOPIA, COCAINSCRNUR, LABBENZ, AMPHETMU, THCU, LABBARB in the last 168 hours.  ?Alcohol Level No results for input(s): ETH in the last 168 hours. ? ?IMAGING past 24 hours ?No results found. ? ?PHYSICAL EXAM ?General:  Alert, thin-appearing elderly patient in no acute distress ?Respiratory:  Regular, unlabored respirations on room air ? ?NEURO:  ?Mental Status: AA&Ox3  ?Speech/Language: speech is without dysarthria or aphasia.  Fluency, and comprehension intact. ? ?Cranial Nerves:  ?II: PERRL. Visual fields full.  ?III, IV, VI: EOMI with left sided dysmetric saccades. Eyelids elevate symmetrically.  ?V: Sensation is intact to light touch and symmetrical to face.  ?VII: Smile is symmetrical.  ?VIII: hearing intact to voice. ?IX, X: Phonation is normal.  ?IH:[id number] shrug  5/5. ?XII: tongue is midline without fasciculations. ?Motor: 5/5 strength to all muscle groups tested.  ?Sensation- Intact to light touch bilaterally.  ?Coordination: FTN intact bilaterally, HKS: no ataxia in BLE.No drift.  ?Gait- deferred ? ? ?ASSESSMENT/PLAN ?Ms. RALYNN SAN is a 88 y.o. female with history of HTN and osteoarthritis presenting with  severe dizziness with nausea and vomiting for about ten days for which she was seen twice in the ED prior to this admission.  CT head performed on admission here shows a small right cerebellar IPH. ? ?IPH:  right cerebellar IPH in setting of uncontrolled hypertension ?CT head Small acute IPH in right cerebellum ?CTA head & neck No LVO, aneurysm or vascular malformation, severe right P2 stenosis ?MRI  stable right cerebellar IPH, mild chronic microvascular disease ?2D Echo EF 38-75%, grade 1 diastolic dysfunction, normal atrial septum ?LDL 125 ?HgbA1c 5.9 ?VTE prophylaxis - SCDs ?   ?Diet  ? Diet Heart Room service appropriate? Yes; Fluid consistency: Thin  ? ?No antithrombotic prior to admission, now on No antithrombotic secondary to IPH ?Therapy recommendations:  CIR ?Disposition:  pending ? ?Hypertension ?Home meds:  HCTZ 12.5 mg daily ?Stable, no longer requiring Cleviprex ?PRN labetalol and hydralazine ?Keep SBP 130-150 for first 24 hours, then <160 ?  Long-term BP goal normotensive ? ?Hyperlipidemia ?Home meds:  none ?LDL 125, goal < 70 ?Add statin at discharge ? ?Other Stroke Risk Factors ?Advanced Age >/= 69  ? ?Other Active Problems ?none ? ?Hospital day # 4 ? ?Orick , MSN, AGACNP-BC ?[facility] ?See Amion for schedule and pager information ?01/24/2022 1:23 PM ? I have personally obtained history,examined this patient, reviewed notes, independently viewed imaging studies, participated in medical decision making and plan of care.ROS completed by me personally and pertinent positives fully documented  I have made any additions or  clarifications directly to the above note. Agree with note above.   ? ?Antony Contras, MD ?Medical Director ?[facility] ?Pager: [phone] ?01/24/2022 4:05 PM ? ? ?To contact Stroke Continuity provider, please refer to [email or web address] ?After hours, contact General Neurology  ?

## 2022-01-24 NOTE — Progress Notes (Signed)
? ?Providing Compassionate, Quality Care - Together ? ? ?Subjective: ?Patient reports she feels a little "groggy" since she got some pain medication around 7 this morning. ? ?Objective: ?Vital signs in last 24 hours: ?Temp:  [97.7 ?F (36.5 ?C)-98.7 ?F (37.1 ?C)] 98.7 ?F (37.1 ?C) (05/12 0725) ?Pulse Rate:  [74-97] 75 (05/12 0725) ?Resp:  [14-19] 16 (05/12 0725) ?BP: (97-113)/(62-79) 113/71 (05/12 0725) ?SpO2:  [93 %-98 %] 95 % (05/12 0725) ? ?Intake/Output from previous day: ?05/11 0701 - 05/12 0700 ?In: -  ?Out: 825 [Urine:825] ?Intake/Output this shift: ?No intake/output data recorded. ? ?Alert and oriented x 4 ?PERRLA ?CN II-XII grossly intact ?MAE, Strength and sensation intact ? ? ?Lab Results: ?No results for input(s): WBC, HGB, HCT, PLT in the last 72 hours. ?BMET ?No results for input(s): NA, K, CL, CO2, GLUCOSE, BUN, CREATININE, CALCIUM in the last 72 hours. ? ?Studies/Results: ?ECHOCARDIOGRAM COMPLETE ? ?Result Date: 01/22/2022 ?   ECHOCARDIOGRAM REPORT   Patient Name:   Molly Patterson Date of Exam: 01/22/2022 Medical Rec #:  [id number]          Height:       62.8 in Accession #:    [id number]         Weight:       127.6 lb Date of Birth:  06-Mar-1932          BSA:          1.593 m? Patient Age:    88 years           BP:           112/68 mmHg Patient Gender: F                  HR:           84 bpm. Exam Location:  Inpatient Procedure: 2D Echo, Color Doppler and Limited Color Doppler Indications:    Stroke  History:        Patient has no prior history of Echocardiogram examinations.  Sonographer:    Joette Catching RCS Referring Phys: [id number] Rodman E DE LA Felsenthal  1. Left ventricular ejection fraction, by estimation, is 60 to 65%. The left ventricle has normal function. The left ventricle has no regional wall motion abnormalities. Left ventricular diastolic parameters are consistent with Grade I diastolic dysfunction (impaired relaxation).  2. Right ventricular systolic function is normal. The  right ventricular size is normal.  3. The mitral valve is normal in structure. Trivial mitral valve regurgitation.  4. The aortic valve is tricuspid. There is mild calcification of the aortic valve. There is mild thickening of the aortic valve. Aortic valve regurgitation is trivial. Aortic valve sclerosis/calcification is present, without any evidence of aortic stenosis.  5. The inferior vena cava is normal in size with greater than 50% respiratory variability, suggesting right atrial pressure of 3 mmHg. Comparison(s): No prior Echocardiogram. Conclusion(s)/Recommendation(s): No intracardiac source of embolism detected on this transthoracic study. Consider a transesophageal echocardiogram to exclude cardiac source of embolism if clinically indicated. FINDINGS  Left Ventricle: Left ventricular ejection fraction, by estimation, is 60 to 65%. The left ventricle has normal function. The left ventricle has no regional wall motion abnormalities. The left ventricular internal cavity size was normal in size. There is  no left ventricular hypertrophy. Left ventricular diastolic parameters are consistent with Grade I diastolic dysfunction (impaired relaxation). Right Ventricle: The right ventricular size is normal. No increase in right ventricular wall thickness. Right ventricular systolic function  is normal. Left Atrium: Left atrial size was normal in size. Right Atrium: Right atrial size was normal in size. Pericardium: There is no evidence of pericardial effusion. Mitral Valve: The mitral valve is normal in structure. Trivial mitral valve regurgitation. Tricuspid Valve: The tricuspid valve is normal in structure. Tricuspid valve regurgitation is trivial. Aortic Valve: The aortic valve is tricuspid. There is mild calcification of the aortic valve. There is mild thickening of the aortic valve. Aortic valve regurgitation is trivial. Aortic valve sclerosis/calcification is present, without any evidence of aortic stenosis.  Aortic valve mean gradient measures 6.0 mmHg. Aortic valve peak gradient measures 10.8 mmHg. Aortic valve area, by VTI measures 1.54 cm?. Pulmonic Valve: The pulmonic valve was not well visualized. Pulmonic valve regurgitation is trivial. Aorta: The aortic root and ascending aorta are structurally normal, with no evidence of dilitation. Venous: The inferior vena cava is normal in size with greater than 50% respiratory variability, suggesting right atrial pressure of 3 mmHg. IAS/Shunts: The atrial septum is grossly normal.  LEFT VENTRICLE PLAX 2D LVIDd:         5.20 cm   Diastology LVIDs:         3.50 cm   LV e' medial:    4.35 cm/s LV PW:         0.70 cm   LV E/e' medial:  11.2 LV IVS:        0.60 cm   LV e' lateral:   7.18 cm/s LVOT diam:     1.70 cm   LV E/e' lateral: 6.8 LV SV:         47 LV SV Index:   30 LVOT Area:     2.27 cm?  RIGHT VENTRICLE             IVC RV Basal diam:  3.90 cm     IVC diam: 1.40 cm RV Mid diam:    3.00 cm RV S prime:     21.90 cm/s TAPSE (M-mode): 2.2 cm LEFT ATRIUM             Index        RIGHT ATRIUM           Index LA diam:        3.10 cm 1.95 cm/m?   RA Area:     12.10 cm? LA Vol (A2C):   25.1 ml 15.75 ml/m?  RA Volume:   26.40 ml  16.57 ml/m? LA Vol (A4C):   38.3 ml 24.04 ml/m? LA Biplane Vol: 32.0 ml 20.08 ml/m?  AORTIC VALVE                     PULMONIC VALVE AV Area (Vmax):    1.44 cm?      PV Vmax:       0.84 m/s AV Area (Vmean):   1.45 cm?      PV Peak grad:  2.8 mmHg AV Area (VTI):     1.54 cm? AV Vmax:           164.00 cm/s AV Vmean:          121.000 cm/s AV VTI:            0.306 m AV Peak Grad:      10.8 mmHg AV Mean Grad:      6.0 mmHg LVOT Vmax:         104.00 cm/s LVOT Vmean:        77.100 cm/s LVOT VTI:  0.208 m LVOT/AV VTI ratio: 0.68  AORTA Ao Root diam: 3.10 cm Ao Asc diam:  3.20 cm MITRAL VALVE               TRICUSPID VALVE MV Area (PHT): 8.25 cm?    TR Peak grad:   27.2 mmHg MV Decel Time: 92 msec     TR Vmax:        261.00 cm/s MV E velocity: 48.80 cm/s MV  A velocity: 83.60 cm/s  SHUNTS MV E/A ratio:  0.58        Systemic VTI:  0.21 m                            Systemic Diam: 1.70 cm Gwyndolyn Kaufman MD Electronically signed by Gwyndolyn Kaufman MD Signature Date/Time: 01/22/2022/12:41:22 PM    Final    ? ?Assessment/Plan: ?Patient with a cerebellar hemorrhage. ? ? LOS: 4 days  ? ?-Plan is for CIR if bed becomes available. Patient will discharge home with [facility] PT/OT following further inpatient stay if no beds become available in CIR. ?-Continue therapies and efforts at mobilization. ? ? ?Viona Gilmore, DNP, AGNP-C ?Nurse Practitioner ? ?[facility] ?[address], [place],  [address] ?P: [phone]    F: [phone] ? ?01/24/2022, 9:29 AM ? ? ? ? ?

## 2022-01-24 NOTE — Plan of Care (Signed)
In good spirits.  No complaints.   ?

## 2022-01-25 DIAGNOSIS — R42 Dizziness and giddiness: Secondary | ICD-10-CM | POA: Diagnosis not present

## 2022-01-25 DIAGNOSIS — I614 Nontraumatic intracerebral hemorrhage in cerebellum: Secondary | ICD-10-CM | POA: Diagnosis not present

## 2022-01-25 LAB — CBC
HCT: 41.7 % (ref 36.0–46.0)
Hemoglobin: 13.4 g/dL (ref 12.0–15.0)
MCH: 28.9 pg (ref 26.0–34.0)
MCHC: 32.1 g/dL (ref 30.0–36.0)
MCV: 90.1 fL (ref 80.0–100.0)
Platelets: 263 10*3/uL (ref 150–400)
RBC: 4.63 MIL/uL (ref 3.87–5.11)
RDW: 15.6 % — ABNORMAL HIGH (ref 11.5–15.5)
WBC: 12.8 10*3/uL — ABNORMAL HIGH (ref 4.0–10.5)
nRBC: 0 % (ref 0.0–0.2)

## 2022-01-25 LAB — BASIC METABOLIC PANEL
Anion gap: 7 (ref 5–15)
BUN: 22 mg/dL (ref 8–23)
CO2: 29 mmol/L (ref 22–32)
Calcium: 8.9 mg/dL (ref 8.9–10.3)
Chloride: 101 mmol/L (ref 98–111)
Creatinine, Ser: 0.87 mg/dL (ref 0.44–1.00)
GFR, Estimated: >60 mL/min (ref >60)
Glucose, Bld: 116 mg/dL — ABNORMAL HIGH (ref 70–99)
Potassium: 4 mmol/L (ref 3.5–5.1)
Sodium: 137 mmol/L (ref 135–145)

## 2022-01-25 NOTE — Progress Notes (Addendum)
STROKE TEAM PROGRESS NOTE  ? ?INTERVAL HISTORY ?Patient is seen in her room with no family at the bedside.  She reports only occasional dizziness and states that she hopes to go to CIR soon.  Her vital signs are stable and she had no acute events overnight. ? ?Vitals:  ? 01/24/22 1905 01/24/22 2335 01/25/22 0335 01/25/22 0745  ?BP: 101/90 109/75 110/74 125/67  ?Pulse: 99  79 78  ?Resp: '19 15 13 12  '$ ?Temp: 98.6 ?F (37 ?C) 98.4 ?F (36.9 ?C) 98.4 ?F (36.9 ?C) 97.7 ?F (36.5 ?C)  ?TempSrc: Oral Oral Oral Oral  ?SpO2: 93%  95% 100%  ?Weight:      ?Height:      ? ?CBC:  ?Recent Labs  ?Lab 01/20/22 ?1359 01/25/22 ?0105  ?WBC 9.6 12.8*  ?NEUTROABS 7.6  --   ?HGB 15.9* 13.4  ?HCT 49.1* 41.7  ?MCV 89.8 90.1  ?PLT 311 263  ? ? ?Basic Metabolic Panel:  ?Recent Labs  ?Lab 01/20/22 ?1359 01/25/22 ?0105  ?NA 138 137  ?K 4.1 4.0  ?CL 102 101  ?CO2 27 29  ?GLUCOSE 119* 116*  ?BUN 9 22  ?CREATININE 0.62 0.87  ?CALCIUM 9.2 8.9  ? ? ?Lipid Panel:  ?Recent Labs  ?Lab 01/22/22 ?0128  ?CHOL 187  ?TRIG 83  ?HDL 45  ?CHOLHDL 4.2  ?VLDL 17  ?[place] 125*  ? ? ?HgbA1c:  ?Recent Labs  ?Lab 01/22/22 ?0128  ?HGBA1C 5.9*  ? ? ?Urine Drug Screen: No results for input(s): LABOPIA, COCAINSCRNUR, LABBENZ, AMPHETMU, THCU, LABBARB in the last 168 hours.  ?Alcohol Level No results for input(s): ETH in the last 168 hours. ? ?IMAGING past 24 hours ?No results found. ? ?PHYSICAL EXAM ?General:  Alert, thin-appearing elderly patient in no acute distress ?Respiratory:  Regular, unlabored respirations on room air ? ?NEURO:  ?Mental Status: AA&Ox3  ?Speech/Language: speech is without dysarthria or aphasia.  Fluency, and comprehension intact. ? ?Cranial Nerves:  ?II: PERRL. Visual fields full.  ?III, IV, VI: EOMI. Eyelids elevate symmetrically.  ?V: Sensation is intact to light touch and symmetrical to face.  ?VII: Smile is symmetrical.  ?VIII: hearing intact to voice. ?IX, X: Phonation is normal.  ?IH:[id number] shrug 5/5. ?XII: tongue is midline without  fasciculations. ?Motor: 5/5 strength to all muscle groups tested.  ?Sensation- Intact to light touch bilaterally.  ?Coordination: FTN intact bilaterally, HKS: no ataxia in BLE.No drift.  ?Gait- deferred ? ? ?ASSESSMENT/PLAN ?Molly Patterson is a 88 y.o. female with history of HTN and osteoarthritis presenting with  severe dizziness with nausea and vomiting for about ten days for which she was seen twice in the ED prior to this admission.  CT head performed on admission here shows a small right cerebellar IPH.  Patient is now awaiting CIR bed, and stroke team will sign off but remain available for any questions or concerns ? 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 ?Hypertension ?Home meds:  HCTZ 12.5 mg daily ?Stable, no longer requiring Cleviprex ?PRN labetalol and hydralazine ?Keep SBP 130-150 for first 24  hours, then <160 ?Long-term BP goal normotensive ? ?Hyperlipidemia ?Home meds:  none ?LDL 125, goal < 70 ?On Crestor 10 ?Continue statin at discharge ? ?Other Stroke Risk Factors ?Advanced Age >/= 54  ? ?Other Active Problems ?none ? ?Hospital day # 5 ? ?[place] , MSN, AGACNP-BC ?[facility] ?See Amion for schedule and pager information ?01/25/2022 11:37 AM ? ?ATTENDING NOTE: ?I reviewed above note and agree with the assessment and plan.  ? ?88 year old female with history of hypertension admitted for dizziness and nausea vomiting.  CT showed small right cerebellar ICH.  CT head and neck right P2 stenosis, no  AVM or aneurysm.  MRI showed stable right cerebellar small ICH.  EF 60 to 65%.  LDL 125, A1c 5.9.  Creatinine 0.62. ? ?Etiology of her patient ICH likely due to hypertension.  Currently doing well, pending CIR placement.  Continue BP management with BP goal normotensive.  Continue Crestor 10. ? ?For detailed assessment and plan, please refer to above as I have made changes wherever appropriate.  ? ?Neurology will sign off. Please call with questions. Pt will follow up with stroke clinic NP at [facility] in about 4 weeks. Thanks for the consult. ? ? ?Molly Hawking, MD PhD ?Stroke Neurology ?01/25/2022 ?5:46 PM ? ? ? ?To contact Stroke Continuity provider, please refer to [email or web address] ?After hours, contact General Neurology  ?

## 2022-01-25 NOTE — Plan of Care (Signed)
Pt is alert oriented x 4. Pt has had a few episodes of dizziness. Pt has become upset about not being able to things on her own and feeling weak. PRN antiver given, effective. PRN tramadol given for pain to joints in legs and hips.  Pt assisted to bedside commode and then pt requested use of bedpan.  ? ? ?Problem: Clinical Measurements: ?Goal: Ability to maintain clinical measurements within normal limits will improve ?Outcome: Progressing ?Goal: Will remain free from infection ?Outcome: Progressing ?Goal: Diagnostic test results will improve ?Outcome: Progressing ?Goal: Respiratory complications will improve ?Outcome: Progressing ?Goal: Cardiovascular complication will be avoided ?Outcome: Progressing ?  ?Problem: Activity: ?Goal: Risk for activity intolerance will decrease ?Outcome: Progressing ?  ?Problem: Nutrition: ?Goal: Adequate nutrition will be maintained ?Outcome: Progressing ?  ?Problem: Education: ?Goal: Knowledge of disease or condition will improve ?Outcome: Progressing ?Goal: Knowledge of secondary prevention will improve (SELECT ALL) ?Outcome: Progressing ?Goal: Knowledge of patient specific risk factors will improve (INDIVIDUALIZE FOR PATIENT) ?Outcome: Progressing ?Goal: Individualized Educational Video(s) ?Outcome: Progressing ?  ?Problem: Coping: ?Goal: Will verbalize positive feelings about self ?Outcome: Progressing ?Goal: Will identify appropriate support needs ?Outcome: Progressing ?  ?Problem: Self-Care: ?Goal: Ability to participate in self-care as condition permits will improve ?Outcome: Progressing ?Goal: Verbalization of feelings and concerns over difficulty with self-care will improve ?Outcome: Progressing ?Goal: Ability to communicate needs accurately will improve ?Outcome: Progressing ?  ?Problem: Nutrition: ?Goal: Risk of aspiration will decrease ?Outcome: Progressing ?Goal: Dietary intake will improve ?Outcome: Progressing ?  ?Problem: Intracerebral Hemorrhage Tissue  Perfusion: ?Goal: Complications of Intracerebral Hemorrhage will be minimized ?Outcome: Progressing ?  ?Problem: Ischemic Stroke/TIA Tissue Perfusion: ?Goal: Complications of ischemic stroke/TIA will be minimized ?Outcome: Progressing ?  ?Problem: Spontaneous Subarachnoid Hemorrhage Tissue Perfusion: ?Goal: Complications of Spontaneous Subarachnoid Hemorrhage will be minimized ?Outcome: Progressing ?  ?

## 2022-01-25 NOTE — Progress Notes (Signed)
? ?Molly Patterson  FTD:[id number] DOB: 1932-06-23 DOA: 01/20/2022 ?PCP: Eulas Post, MD   ? 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  She had been evaluated at [facility] ED on 2 separate occasions with a head CT on the first visit interpreted as revealing no acute intracranial process.  She did not have MRI imaging.  Due to persistence of her symptoms she presented to [facility] 5/8 at which time CT of the head revealed a small right cerebellar intraparenchymal hemorrhage.  ?She ?Consultants:  ?Neurology ?Neurosurgery ? ?Goals of Care:  Code Status: Full Code  ? ?DVT prophylaxis: ?SCDs ? ?Interim Hx: ?No acute events reported overnight.  We are presently awaiting a bed in CIR.  Dizziness persists and is proving to be quite frustrating for the patient, and affects her ability to ambulate/care for herself.  Afebrile.  Vital signs stable.  Becoming impatient with wait for CIR bed. ? ?Assessment & Plan: ? ?Right cerebellar intraparenchymal hemorrhage in setting of uncontrolled hypertension ?CT head small acute IPH in right cerebellum ?CTA head & neck No LVO, aneurysm or vascular malformation, severe right P2 stenosis ?MRI  stable right cerebellar IPH, mild chronic microvascular disease ?TTE noted EF 60-65% with normal LV function and no WMA with grade 1 DD and no evidence of an intracardiac source of embolism ?LDL 125 ?HgbA1c 5.9 ?Consider repeat MRI without contrast in 2-3 months to rule out underlying structural or vascular lesions ? ?Uncontrolled hypertension ?Was previously on HCTZ alone -required Cleviprex during initial portion of hospital stay in setting of IPH -goal is strict blood pressure control with systolics less than 062 -blood pressure remains at goal ? ?HLD ?Goal is LDL less than 70 - continue statin at discharge ? ?Family Communication: Spoke with  husband at bedside at length ?Disposition: Awaiting CIR bed -medically stable for discharge when bed identified ? ? ?Objective: ?Blood pressure 125/67, pulse 78, temperature 97.7 ?F (36.5 ?C), temperature source Oral, resp. rate 12, height 5' 2.75" (1.594 m), weight 57.9 kg, SpO2 100 %. ? ?Intake/Output Summary (Last 24 hours) at 01/25/2022 0809 ?Last data filed at 01/25/2022 0530 ?Gross per 24 hour  ?Intake 120 ml  ?Output --  ?Net 120 ml  ? ? ?Filed Weights  ? 01/20/22 1331  ?Weight: 57.9 kg  ? ? ?Examination: ?General: No acute respiratory distress ?Lungs: Clear to auscultation bilaterally  ?Cardiovascular: RRR without murmur ?Abdomen: NT/ND, soft, BS positive ?Extremities: No cyanosis, clubbing, or edema B LE ? ?CBC: ?Recent Labs  ?Lab 01/20/22 ?1359 01/25/22 ?0105  ?WBC 9.6 12.8*  ?NEUTROABS 7.6  --   ?HGB 15.9* 13.4  ?HCT 49.1* 41.7  ?MCV 89.8 90.1  ?PLT 311 263  ? ? ?Basic Metabolic Panel: ?Recent Labs  ?Lab 01/20/22 ?1359 01/25/22 ?0105  ?NA 138 137  ?K 4.1 4.0  ?CL 102 101  ?CO2 27 29  ?GLUCOSE 119* 116*  ?BUN 9 22  ?CREATININE 0.62 0.87  ?CALCIUM 9.2 8.9  ? ? ?GFR: ?Estimated Creatinine Clearance: 36.6 mL/min (by C-G formula based on SCr of 0.87 mg/dL). ? ?Liver Function Tests: ?Recent Labs  ?Lab 01/20/22 ?1359  ?AST 17  ?ALT 15  ?ALKPHOS 70  ?BILITOT 0.6  ?PROT 6.7  ?ALBUMIN 3.6  ? ? ?Scheduled Meds: ? amLODipine  10 mg Oral Daily  ? benazepril  10 mg Oral Daily  ? cholecalciferol  1,000  Units Oral Daily  ? feeding supplement  237 mL Oral BID BM  ? hydrochlorothiazide  12.5 mg Oral Daily  ? multivitamin with minerals  1 tablet Oral Daily  ? pantoprazole  40 mg Oral QHS  ? rosuvastatin  10 mg Oral Daily  ? vitamin B-12  1,000 mcg Oral Daily  ? ? ? LOS: 5 days  ? ?Cherene Altes, MD ?[facility] ?Office  [phone] ?Pager - Text Page per Shea Evans ? ?If 7PM-7AM, please contact night-coverage per Amion ?01/25/2022, 8:09 AM ? ? ? ? ?

## 2022-01-25 NOTE — Progress Notes (Signed)
NEUROSURGERY PROGRESS NOTE ? ?Doing well. Complains of some intermittent dizziness but no headaches. Sitting up in bed eating breakfast. S/p cerebellar hemorrhage.  ? ?Temp:  [97.7 ?F (36.5 ?C)-98.7 ?F (37.1 ?C)] 97.7 ?F (36.5 ?C) (05/13 0745) ?Pulse Rate:  [78-100] 78 (05/13 0745) ?Resp:  [12-20] 12 (05/13 0745) ?BP: (101-125)/(67-90) 125/67 (05/13 0745) ?SpO2:  [93 %-100 %] 100 % (05/13 0745) ? ? ?Eleonore Chiquito, NP ?01/25/2022 ?8:23 AM ?  ?

## 2022-01-26 DIAGNOSIS — R42 Dizziness and giddiness: Secondary | ICD-10-CM | POA: Diagnosis not present

## 2022-01-26 MED ORDER — BISACODYL 10 MG RE SUPP
10.0000 mg | Freq: Once | RECTAL | Status: AC | PRN
  Administered 2022-01-26: 10 mg via RECTAL
  Filled 2022-01-26: qty 1

## 2022-01-26 NOTE — Progress Notes (Signed)
? ?Molly Patterson  XNA:[id number] DOB: February 26, 1932 DOA: 01/20/2022 ?PCP: Eulas Post, MD   ? 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  She had been evaluated at [facility] ED on 2 separate occasions with a head CT on the first visit interpreted as revealing no acute intracranial process.  She did not have MRI imaging.  Due to persistence of her symptoms she presented to [facility] 5/8 at which time CT of the head revealed a small right cerebellar intraparenchymal hemorrhage.  ?She ?Consultants:  ?Neurology ?Neurosurgery ? ?Goals of Care:  Code Status: Full Code  ? ?DVT prophylaxis: ?SCDs ? ?Interim Hx: ?Afebrile.  Vital signs stable.  No acute events overnight.  In good spirits today.  Patiently awaiting bed in CIR. ? ?Assessment & Plan: ? ?Right cerebellar intraparenchymal hemorrhage in setting of uncontrolled hypertension ?CT head small acute IPH in right cerebellum ?CTA head & neck No LVO, aneurysm or vascular malformation, severe right P2 stenosis ?MRI  stable right cerebellar IPH, mild chronic microvascular disease ?TTE noted EF 60-65% with normal LV function and no WMA with grade 1 DD and no evidence of an intracardiac source of embolism ?LDL 125 ?HgbA1c 5.9 ?Consider repeat MRI without contrast in 2-3 months to rule out underlying structural or vascular lesions ? ?Uncontrolled hypertension ?Was previously on HCTZ alone -required Cleviprex during initial portion of hospital stay in setting of IPH -goal is strict blood pressure control with systolics less than 542 -blood pressure remains at goal ? ?HLD ?Goal is LDL less than 70 - continue statin at discharge ? ?Family Communication: Spoke with son and husband at bedside ?Disposition: Awaiting CIR bed -medically stable for discharge when bed identified ? ? ?Objective: ?Blood pressure 109/68, pulse 83,  temperature 98.3 ?F (36.8 ?C), temperature source Oral, resp. rate 15, height 5' 2.75" (1.594 m), weight 57.9 kg, SpO2 96 %. ? ?Intake/Output Summary (Last 24 hours) at 01/26/2022 0909 ?Last data filed at 01/25/2022 2130 ?Gross per 24 hour  ?Intake 120 ml  ?Output --  ?Net 120 ml  ? ? ?Filed Weights  ? 01/20/22 1331  ?Weight: 57.9 kg  ? ? ?Examination: ?General: No acute respiratory distress ?Lungs: Clear to auscultation bilaterally  ?Cardiovascular: RRR without murmur ?Abdomen: NT/ND, soft, BS positive ?Extremities: No edema B LE ? ?CBC: ?Recent Labs  ?Lab 01/20/22 ?1359 01/25/22 ?0105  ?WBC 9.6 12.8*  ?NEUTROABS 7.6  --   ?HGB 15.9* 13.4  ?HCT 49.1* 41.7  ?MCV 89.8 90.1  ?PLT 311 263  ? ? ?Basic Metabolic Panel: ?Recent Labs  ?Lab 01/20/22 ?1359 01/25/22 ?0105  ?NA 138 137  ?K 4.1 4.0  ?CL 102 101  ?CO2 27 29  ?GLUCOSE 119* 116*  ?BUN 9 22  ?CREATININE 0.62 0.87  ?CALCIUM 9.2 8.9  ? ? ?GFR: ?Estimated Creatinine Clearance: 36.6 mL/min (by C-G formula based on SCr of 0.87 mg/dL). ? ?Liver Function Tests: ?Recent Labs  ?Lab 01/20/22 ?1359  ?AST 17  ?ALT 15  ?ALKPHOS 70  ?BILITOT 0.6  ?PROT 6.7  ?ALBUMIN 3.6  ? ? ?Scheduled Meds: ? amLODipine  10 mg Oral Daily  ? benazepril  10 mg Oral Daily  ? cholecalciferol  1,000 Units Oral Daily  ? feeding supplement  237 mL Oral BID BM  ? hydrochlorothiazide  12.5 mg Oral Daily  ? multivitamin with minerals  1 tablet Oral Daily  ?  pantoprazole  40 mg Oral QHS  ? rosuvastatin  10 mg Oral Daily  ? vitamin B-12  1,000 mcg Oral Daily  ? ? ? LOS: 6 days  ? ?Cherene Altes, MD ?[facility] ?Office  [phone] ?Pager - Text Page per Shea Evans ? ?If 7PM-7AM, please contact night-coverage per Amion ?01/26/2022, 9:09 AM ? ? ? ? ?

## 2022-01-26 NOTE — Plan of Care (Signed)
Pt is alert oriented x 4. Pt has used bedpan x 2 for urine output. Pt c/o chronic pain, prn tramadol given, effective results.  ? ? ?Problem: Clinical Measurements: ?Goal: Ability to maintain clinical measurements within normal limits will improve ?Outcome: Progressing ?Goal: Will remain free from infection ?Outcome: Progressing ?Goal: Diagnostic test results will improve ?Outcome: Progressing ?Goal: Respiratory complications will improve ?Outcome: Progressing ?Goal: Cardiovascular complication will be avoided ?Outcome: Progressing ?  ?Problem: Activity: ?Goal: Risk for activity intolerance will decrease ?Outcome: Progressing ?  ?Problem: Nutrition: ?Goal: Adequate nutrition will be maintained ?Outcome: Progressing ?  ?Problem: Education: ?Goal: Knowledge of disease or condition will improve ?Outcome: Progressing ?Goal: Knowledge of secondary prevention will improve (SELECT ALL) ?Outcome: Progressing ?Goal: Knowledge of patient specific risk factors will improve (INDIVIDUALIZE FOR PATIENT) ?Outcome: Progressing ?Goal: Individualized Educational Video(s) ?Outcome: Progressing ?  ?Problem: Coping: ?Goal: Will verbalize positive feelings about self ?Outcome: Progressing ?Goal: Will identify appropriate support needs ?Outcome: Progressing ?  ?Problem: Self-Care: ?Goal: Ability to participate in self-care as condition permits will improve ?Outcome: Progressing ?Goal: Verbalization of feelings and concerns over difficulty with self-care will improve ?Outcome: Progressing ?Goal: Ability to communicate needs accurately will improve ?Outcome: Progressing ?  ?Problem: Nutrition: ?Goal: Risk of aspiration will decrease ?Outcome: Progressing ?Goal: Dietary intake will improve ?Outcome: Progressing ?  ?Problem: Intracerebral Hemorrhage Tissue Perfusion: ?Goal: Complications of Intracerebral Hemorrhage will be minimized ?Outcome: Progressing ?  ?Problem: Ischemic Stroke/TIA Tissue Perfusion: ?Goal: Complications of ischemic  stroke/TIA will be minimized ?Outcome: Progressing ?  ?Problem: Spontaneous Subarachnoid Hemorrhage Tissue Perfusion: ?Goal: Complications of Spontaneous Subarachnoid Hemorrhage will be minimized ?Outcome: Progressing ?  ?

## 2022-01-27 ENCOUNTER — Encounter (HOSPITAL_COMMUNITY): Payer: Self-pay | Admitting: Neurology

## 2022-01-27 ENCOUNTER — Encounter (HOSPITAL_COMMUNITY): Payer: Self-pay | Admitting: Physical Medicine & Rehabilitation

## 2022-01-27 ENCOUNTER — Inpatient Hospital Stay (HOSPITAL_COMMUNITY)
Admission: RE | Admit: 2022-01-27 | Discharge: 2022-02-04 | DRG: 057 | Disposition: A | Discharge status: Home / Self Care | Payer: Medicare Other | Source: Intra-hospital | Attending: Physical Medicine & Rehabilitation | Admitting: Physical Medicine & Rehabilitation

## 2022-01-27 ENCOUNTER — Other Ambulatory Visit: Payer: Self-pay

## 2022-01-27 DIAGNOSIS — Z8616 Personal history of COVID-19: Secondary | ICD-10-CM | POA: Diagnosis not present

## 2022-01-27 DIAGNOSIS — I10 Essential (primary) hypertension: Secondary | ICD-10-CM | POA: Diagnosis present

## 2022-01-27 DIAGNOSIS — Z8042 Family history of malignant neoplasm of prostate: Secondary | ICD-10-CM | POA: Diagnosis not present

## 2022-01-27 DIAGNOSIS — I614 Nontraumatic intracerebral hemorrhage in cerebellum: Secondary | ICD-10-CM

## 2022-01-27 DIAGNOSIS — E785 Hyperlipidemia, unspecified: Secondary | ICD-10-CM | POA: Diagnosis present

## 2022-01-27 DIAGNOSIS — R42 Dizziness and giddiness: Secondary | ICD-10-CM | POA: Diagnosis not present

## 2022-01-27 DIAGNOSIS — K219 Gastro-esophageal reflux disease without esophagitis: Secondary | ICD-10-CM | POA: Diagnosis not present

## 2022-01-27 DIAGNOSIS — Z833 Family history of diabetes mellitus: Secondary | ICD-10-CM

## 2022-01-27 DIAGNOSIS — R110 Nausea: Secondary | ICD-10-CM | POA: Diagnosis not present

## 2022-01-27 DIAGNOSIS — I629 Nontraumatic intracranial hemorrhage, unspecified: Secondary | ICD-10-CM | POA: Diagnosis not present

## 2022-01-27 DIAGNOSIS — I7300 Raynaud's syndrome without gangrene: Secondary | ICD-10-CM | POA: Diagnosis present

## 2022-01-27 DIAGNOSIS — Z880 Allergy status to penicillin: Secondary | ICD-10-CM | POA: Diagnosis not present

## 2022-01-27 DIAGNOSIS — R2689 Other abnormalities of gait and mobility: Secondary | ICD-10-CM | POA: Diagnosis not present

## 2022-01-27 DIAGNOSIS — Z79899 Other long term (current) drug therapy: Secondary | ICD-10-CM | POA: Diagnosis not present

## 2022-01-27 DIAGNOSIS — D72829 Elevated white blood cell count, unspecified: Secondary | ICD-10-CM | POA: Diagnosis present

## 2022-01-27 DIAGNOSIS — Z8249 Family history of ischemic heart disease and other diseases of the circulatory system: Secondary | ICD-10-CM

## 2022-01-27 DIAGNOSIS — Z85828 Personal history of other malignant neoplasm of skin: Secondary | ICD-10-CM | POA: Diagnosis not present

## 2022-01-27 DIAGNOSIS — I69198 Other sequelae of nontraumatic intracerebral hemorrhage: Principal | ICD-10-CM

## 2022-01-27 DIAGNOSIS — Z888 Allergy status to other drugs, medicaments and biological substances status: Secondary | ICD-10-CM

## 2022-01-27 MED ORDER — ACETAMINOPHEN 325 MG PO TABS: 325.0000 mg | ORAL_TABLET | ORAL | Status: DC | PRN

## 2022-01-27 MED ORDER — PROCHLORPERAZINE MALEATE 5 MG PO TABS
5.0000 mg | ORAL_TABLET | Freq: Four times a day (QID) | ORAL | Status: DC | PRN
  Administered 2022-01-28: 5 mg via ORAL
  Filled 2022-01-27: qty 1

## 2022-01-27 MED ORDER — AMLODIPINE BESYLATE 10 MG PO TABS
10.0000 mg | ORAL_TABLET | Freq: Every day | ORAL | Status: DC
  Administered 2022-01-28 – 2022-02-02 (×6): 10 mg via ORAL
  Filled 2022-01-27 (×6): qty 1

## 2022-01-27 MED ORDER — ONDANSETRON HCL 4 MG/2ML IJ SOLN
4.0000 mg | Freq: Four times a day (QID) | INTRAMUSCULAR | Status: DC | PRN
  Administered 2022-01-27: 4 mg via INTRAVENOUS

## 2022-01-27 MED ORDER — POLYETHYLENE GLYCOL 3350 17 G PO PACK
17.0000 g | PACK | Freq: Every day | ORAL | Status: DC | PRN
  Administered 2022-02-01: 17 g via ORAL
  Filled 2022-01-27: qty 1

## 2022-01-27 MED ORDER — ENOXAPARIN SODIUM 40 MG/0.4ML IJ SOSY
40.0000 mg | PREFILLED_SYRINGE | INTRAMUSCULAR | Status: DC
  Administered 2022-01-27 – 2022-01-31 (×5): 40 mg via SUBCUTANEOUS
  Filled 2022-01-27 (×5): qty 0.4

## 2022-01-27 MED ORDER — GUAIFENESIN-DM 100-10 MG/5ML PO SYRP: 5.0000 mL | ORAL_SOLUTION | Freq: Four times a day (QID) | ORAL | Status: DC | PRN

## 2022-01-27 MED ORDER — PANTOPRAZOLE SODIUM 40 MG PO TBEC
40.0000 mg | DELAYED_RELEASE_TABLET | Freq: Every day | ORAL | Status: DC
  Administered 2022-01-27 – 2022-02-03 (×8): 40 mg via ORAL
  Filled 2022-01-27 (×8): qty 1

## 2022-01-27 MED ORDER — BISACODYL 10 MG RE SUPP: 10.0000 mg | Freq: Every day | RECTAL | Status: DC | PRN

## 2022-01-27 MED ORDER — TRAZODONE HCL 50 MG PO TABS: 25.0000 mg | ORAL_TABLET | Freq: Every evening | ORAL | Status: DC | PRN

## 2022-01-27 MED ORDER — PROCHLORPERAZINE 25 MG RE SUPP
12.5000 mg | Freq: Four times a day (QID) | RECTAL | Status: DC | PRN
Start: 2022-01-27 — End: 2022-01-29

## 2022-01-27 MED ORDER — BENAZEPRIL HCL 5 MG PO TABS
10.0000 mg | ORAL_TABLET | Freq: Every day | ORAL | Status: DC
  Administered 2022-01-28 – 2022-02-04 (×8): 10 mg via ORAL
  Filled 2022-01-27 (×8): qty 2

## 2022-01-27 MED ORDER — TRAMADOL HCL 50 MG PO TABS
50.0000 mg | ORAL_TABLET | Freq: Four times a day (QID) | ORAL | Status: DC | PRN
  Administered 2022-01-27: 50 mg via ORAL
  Filled 2022-01-27: qty 1

## 2022-01-27 MED ORDER — VITAMIN B-12 1000 MCG PO TABS
1000.0000 ug | ORAL_TABLET | Freq: Every day | ORAL | Status: DC
  Administered 2022-01-28 – 2022-02-04 (×8): 1000 ug via ORAL
  Filled 2022-01-27 (×8): qty 1

## 2022-01-27 MED ORDER — PROCHLORPERAZINE EDISYLATE 10 MG/2ML IJ SOLN
5.0000 mg | Freq: Four times a day (QID) | INTRAMUSCULAR | Status: DC | PRN
  Administered 2022-01-29: 10 mg via INTRAMUSCULAR
  Filled 2022-01-27: qty 2

## 2022-01-27 MED ORDER — ADULT MULTIVITAMIN W/MINERALS CH
1.0000 | ORAL_TABLET | Freq: Every day | ORAL | Status: DC
  Administered 2022-01-28 – 2022-02-04 (×8): 1 via ORAL
  Filled 2022-01-27 (×8): qty 1

## 2022-01-27 MED ORDER — HYDROCHLOROTHIAZIDE 12.5 MG PO TABS
12.5000 mg | ORAL_TABLET | Freq: Every day | ORAL | Status: DC
Start: 2022-01-28 — End: 2022-02-04
  Administered 2022-01-28 – 2022-02-04 (×8): 12.5 mg via ORAL
  Filled 2022-01-27 (×8): qty 1

## 2022-01-27 MED ORDER — VITAMIN D 25 MCG (1000 UNIT) PO TABS
1000.0000 [IU] | ORAL_TABLET | Freq: Every day | ORAL | Status: DC
  Administered 2022-01-28 – 2022-02-04 (×8): 1000 [IU] via ORAL
  Filled 2022-01-27 (×8): qty 1

## 2022-01-27 MED ORDER — DIPHENHYDRAMINE HCL 12.5 MG/5ML PO ELIX: 12.5000 mg | ORAL_SOLUTION | Freq: Four times a day (QID) | ORAL | Status: DC | PRN

## 2022-01-27 MED ORDER — ROSUVASTATIN CALCIUM 5 MG PO TABS
10.0000 mg | ORAL_TABLET | Freq: Every day | ORAL | Status: DC
  Administered 2022-01-28 – 2022-02-04 (×4): 10 mg via ORAL
  Filled 2022-01-27 (×6): qty 2

## 2022-01-27 MED ORDER — ONDANSETRON HCL 4 MG/2ML IJ SOLN
INTRAMUSCULAR | Status: AC
  Filled 2022-01-27: qty 2

## 2022-01-27 MED ORDER — FLEET ENEMA 7-19 GM/118ML RE ENEM: 1.0000 | ENEMA | Freq: Once | RECTAL | Status: DC | PRN

## 2022-01-27 MED ORDER — ENSURE ENLIVE PO LIQD
237.0000 mL | Freq: Two times a day (BID) | ORAL | Status: DC
  Administered 2022-01-28 – 2022-02-04 (×12): 237 mL via ORAL

## 2022-01-27 MED ORDER — MECLIZINE HCL 25 MG PO TABS
12.5000 mg | ORAL_TABLET | Freq: Three times a day (TID) | ORAL | Status: DC | PRN
  Administered 2022-01-29 – 2022-02-02 (×7): 12.5 mg via ORAL
  Filled 2022-01-27 (×7): qty 1

## 2022-01-27 MED ORDER — ALUM & MAG HYDROXIDE-SIMETH 200-200-20 MG/5ML PO SUSP: 30.0000 mL | ORAL | Status: DC | PRN

## 2022-01-27 NOTE — Progress Notes (Signed)
Physical Therapy Treatment ?Patient Details ?Name: Molly Patterson ?MRN: [id number] ?DOB: February 13, 1932 ?Today's Date: 01/27/2022 ? ? ?History of Present Illness 88 yo female who has been having dizziness and nausea with vomiting. She presented to [facility]x with first CT neg. CT at [facility] showed small acute IPH R cerebellum. PMH: HTN, OA, Raynaud's. ? ?  ?PT Comments  ? ? Pt able to use visual compensation to transition, transfer and ambulate in the hall today with min assist over all.  Pt did experience symptoms, but overnight had experienced N/V.  Ready for AIR.   ?Recommendations for follow up therapy are one component of a multi-disciplinary discharge planning process, led by the attending physician.  Recommendations may be updated based on patient status, additional functional criteria and insurance authorization. ? ?Follow Up Recommendations ? Acute inpatient rehab (3hours/day) ?  ?  ?Assistance Recommended at Discharge Frequent or constant Supervision/Assistance  ?Patient can return home with the following A lot of help with walking and/or transfers;A little help with bathing/dressing/bathroom;Assistance with cooking/housework;Assist for transportation;Help with stairs or ramp for entrance ?  ?Equipment Recommendations ? Rolling walker (2 wheels)  ?  ?Recommendations for Other Services Rehab consult ? ? ?  ?Precautions / Restrictions Precautions ?Precautions: Fall  ?  ? ?Mobility ? Bed Mobility ?Overal bed mobility: Needs Assistance ?Bed Mobility: Rolling, Sidelying to Sit ?Rolling: Min guard ?Sidelying to sit: Min guard ?  ?Sit to supine: Min guard ?  ?General bed mobility comments: slow movement, anxious about eliciting symptoms. ?  ? ?Transfers ?Overall transfer level: Needs assistance ?Equipment used: Rolling walker (2 wheels) ?Transfers: Sit to/from Stand ?Sit to Stand: Min assist ?  ?Step pivot transfers: Min assist ?  ?  ?  ?General transfer comment: assist for stability/confidence. cues for hand  placement, compensation. ?  ? ?Ambulation/Gait ?Ambulation/Gait assistance: Min assist ?Gait Distance (Feet): 80 Feet ?Assistive device: Rolling walker (2 wheels) ?Gait Pattern/deviations: Step-through pattern ?  ?Gait velocity interpretation: <1.8 ft/sec, indicate of risk for recurrent falls ?  ?General Gait Details: wider BOS, mild instability, some drift.  Slow cadence using visual compensation to decrease symptoms of dizziness. ? ? ?Stairs ?  ?  ?  ?  ?  ? ? ?Wheelchair Mobility ?  ? ?Modified Rankin (Stroke Patients Only) ?Modified Rankin (Stroke Patients Only) ?Pre-Morbid Rankin Score: No symptoms ?Modified Rankin: Moderately severe disability ? ? ?  ?Balance Overall balance assessment: Needs assistance ?Sitting-balance support: No upper extremity supported, Feet supported ?Sitting balance-Leahy Scale: Fair ?  ?  ?Standing balance support: Bilateral upper extremity supported, During functional activity ?Standing balance-Leahy Scale: Poor ?Standing balance comment: reliant on AD with UE support or external support ?  ?  ?  ?  ?  ?  ?  ?  ?  ?  ?  ?  ? ?  ?Cognition Arousal/Alertness: Awake/alert ?Behavior During Therapy: [facility] for tasks assessed/performed ?Overall Cognitive Status: Within Functional Limits for tasks assessed ?  ?  ?  ?  ?  ?  ?  ?  ?  ?  ?  ?  ?  ?  ?  ?  ?General Comments: anxious initially due to fear of mobility exacerbating dizziness and nausea - overall WFL ?  ?  ? ?  ?Exercises   ? ?  ?General Comments   ?  ?  ? ?Pertinent Vitals/Pain Pain Assessment ?Pain Assessment: Faces ?Faces Pain Scale: No hurt ?Pain Intervention(s): Monitored during session  ? ? ?Home Living   ?  ?  ?  ?  ?  ?  ?  ?  ?  ?   ?  ?  Prior Function    ?  ?  ?   ? ?PT Goals (current goals can now be found in the care plan section) Acute Rehab PT Goals ?PT Goal Formulation: With patient ?Time For Goal Achievement: 02/04/22 ?Potential to Achieve Goals: Good ?Progress towards PT goals: Progressing toward goals ? ?   ?Frequency ? ? ? Min 4X/week ? ? ? ?  ?PT Plan Current plan remains appropriate  ? ? ?Co-evaluation   ?  ?  ?  ?  ? ?  ?AM-PAC PT "6 Clicks" Mobility   ?Outcome Measure ? Help needed turning from your back to your side while in a flat bed without using bedrails?: A Little ?Help needed moving from lying on your back to sitting on the side of a flat bed without using bedrails?: A Little ?Help needed moving to and from a bed to a chair (including a wheelchair)?: A Little ?Help needed standing up from a chair using your arms (e.g., wheelchair or bedside chair)?: A Little ?Help needed to walk in hospital room?: A Little ?Help needed climbing 3-5 steps with a railing? : A Lot ?6 Click Score: 17 ? ?  ?End of Session   ?Activity Tolerance: Patient tolerated treatment well ?Patient left: in bed;with call bell/phone within reach;with bed alarm set ?Nurse Communication: Mobility status ?PT Visit Diagnosis: Unsteadiness on feet (R26.81);Difficulty in walking, not elsewhere classified (R26.2);Dizziness and giddiness (R42) ?  ? ? ?Time: 1242-1310 ?PT Time Calculation (min) (ACUTE ONLY): 28 min ? ?Charges:  $Gait Training: 8-22 mins ?$Therapeutic Activity: 8-22 mins          ?          ?01/27/2022 ? ?Molly Carne., PT ?Acute Rehabilitation Services ?[phone]  (pager) ?[phone]  (office) ? ?Molly Patterson ?01/27/2022, 1:44 PM ? ?

## 2022-01-27 NOTE — H&P (Signed)
Physical Medicine and Rehabilitation Admission H&P ?  ?  ?   ?Chief Complaint  ?Patient presents with  ? Functional decline due to cerebellar bleed.   ?  ?  ?HPI: Molly Patterson. Molly Patterson is an 88 year old female with history of OA, HTN, Raynauds, vertigo, who was admitted with 10 day history of severe dizziness with N/V. She was seen in ED  4/30 and 5/5 at [facility] with negative CT.  She presented to [facility] on 01/20/22 with ongoing dizziness, nausea and inability to eat. She reported having a ICH about 30 years ago? (Dr. Hal Neer) that did not require any interventions. 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   CTA head/neck was  negative for LVO and showed severe right P2 stenosis. Dr. Eugenia Pancoast was  consulted for input and recommended observation --no surgical needs.  MRI brain revealed stable acute hemorrhage involving right cerebellun without lesion on non-contrast exam. 2D echo showed EF 60-65% with trivial AVR and MVR. Neurology felt that stroke was due to accelerated HTN and she was started on Clevidipine and transitioned to home dose HCTZ with good BP control. She continued to be limited by unsteady gait with intermittent dizziness affecting functional status. CIR recommended due to functional decline.  ?  ?  ?Review of Systems  ?Constitutional:  Negative for chills and fever.  ?HENT:  Positive for hearing loss.   ?Eyes:  Negative for blurred vision and double vision.  ?Respiratory:  Positive for cough (on and off since covid). Negative for shortness of breath.   ?Cardiovascular:  Negative for chest pain and palpitations.  ?Gastrointestinal:  Positive for nausea. Negative for constipation.  ?Musculoskeletal:  Negative for back pain, joint pain, myalgias and neck pain.  ?Skin:  Negative for itching and rash.  ?Neurological:  Positive for dizziness and weakness.  ?Psychiatric/Behavioral:  The patient is not nervous/anxious and does not have insomnia.    ?  ?  ?    ?Past Medical History:  ?Diagnosis Date  ? Allergy    ?  hayfever  ? ANEMIA-IRON DEFICIENCY 08/02/2009  ? Basal cell carcinoma (BCC) of right forearm    ? BURSITIS, HIP 08/02/2009  ? COVID-19    ? HYPERTENSION 08/02/2009  ? OSTEOARTHRITIS, GENERALIZED 08/12/2010  ? Raynaud's syndrome 08/12/2010  ? Transfusion history 09/15/1962  ?  ?  ?     ?Past Surgical History:  ?Procedure Laterality Date  ? APPENDECTOMY      ? TONSILLECTOMY      ?  ?  ?     ?Family History  ?Problem Relation Age of Onset  ? Arthritis Neg Hx    ?      family  ? Hypertension Neg Hx    ?      family  ? Diabetes Neg Hx    ?      family  ? Prostate cancer Neg Hx    ?      family  ?  ?  ?Social History:  Married. 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 She retired at age 61. She reports that she has never smoked. She has never used smokeless tobacco. She reports that she does not drink alcohol and does not use drugs. ?  ?  ?     ?Allergies  ?Allergen Reactions  ? Penicillins Shortness Of Breath and Swelling  ?    REACTION: hives  ? Lidocaine-Epinephrine    ?  REACTION: pulse rapic  ? Rofecoxib    ?    Vioxx, itiching  ?  ?  ?      ?Medications Prior to Admission  ?Medication Sig Dispense Refill  ? amLODipine (NORVASC) 10 MG tablet TAKE ONE (1) TABLET EACH DAY (Patient taking differently: Take 10 mg by mouth daily.) 90 tablet 0  ? azelastine (ASTELIN) 0.1 % nasal spray Place 1 spray into both nostrils 2 (two) times daily. Use in each nostril as directed (Patient taking differently: Place 1 spray into both nostrils daily as needed for rhinitis or allergies.) 30 mL 12  ? benazepril (LOTENSIN) 10 MG tablet TAKE ONE (1) TABLET EACH DAY (Patient taking differently: Take 10 mg by mouth daily.) 90 tablet 0  ? cholecalciferol (VITAMIN D3) 25 MCG (1000 UNIT) tablet Take 1,000 Units by mouth daily.      ? hydrochlorothiazide (MICROZIDE) 12.5 MG capsule TAKE ONE (1) CAPSULE EACH DAY (Patient taking differently: Take 12.5 mg by  mouth daily.) 90 capsule 3  ? meclizine (ANTIVERT) 12.5 MG tablet Take 1 tablet ever 6 hours as needed for vertigo. (Patient taking differently: Take 12.5 mg by mouth 3 (three) times daily as needed for dizziness or nausea.) 30 tablet 0  ? Multiple Vitamin (MULTIVITAMIN WITH MINERALS) TABS tablet Take 1 tablet by mouth daily.      ? ondansetron (ZOFRAN) 4 MG tablet Take 1 tablet every 8 hours as needed for nausea/vomiting (Patient taking differently: Take 4 mg by mouth every 8 (eight) hours as needed for nausea or vomiting.) 20 tablet 0  ? traMADol (ULTRAM) 50 MG tablet TAKE 1-2 TABLETS EVERY 6 HOURS AS NEEDED FOR PAIN (Patient taking differently: Take 50 mg by mouth every 6 (six) hours as needed for moderate pain or severe pain.) 180 tablet 0  ? VENTOLIN HFA 108 (90 Base) MCG/ACT inhaler USE 2 PUFFS EVERY 6 HOURS AS NEEDED (Patient taking differently: 2 puffs every 6 (six) hours as needed for wheezing or shortness of breath.) 18 g 1  ? vitamin B-12 (CYANOCOBALAMIN) 1000 MCG tablet Take 1,000 mcg by mouth daily.      ?  ?  ?  ?  ?Home: ?Home Living ?Family/patient expects to be discharged to:: Private residence ?Living Arrangements: Spouse/significant other ?Available Help at Discharge: Family, Available 24 hours/day ?Type of Home: House ?Home Access: Stairs to enter ?Entrance Stairs-Number of Steps: 7 ?Home Layout: Multi-level ?Alternate Level Stairs-Number of Steps: 7 ?Alternate Level Stairs-Rails: Right, Left ?Bathroom Shower/Tub: Tub/shower unit ?Bathroom Toilet: Standard ?Bathroom Accessibility: Yes ?Home Equipment: Grab bars - toilet, Grab bars - tub/shower, Hand held shower head ? Lives With: Spouse ?  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  ?Functional Status:  ?Mobility: ?Bed Mobility ?Overal bed mobility: Needs Assistance ?Bed Mobility: Rolling (scooting up  bed) ?Rolling: Supervision ?Supine to sit: Min guard ?Sit to supine: Min guard ?General bed mobility comments: declined OOB ?Transfers ?Overall transfer level: Needs assistance ?Equipment used: Rolling walker (2 wheels) ?Transfers: Sit to/from Stand ?Sit to Stand: Min guard ?Bed to/from chair/wheelchair/BSC transfer type:: Step pivot ?Step pivot transfers: Min assist ?General transfer comment: Min Guard A for safety ?Ambulation/Gait ?Ambulation/Gait assistance: Min guard, Min assist ?Gait Distance (Feet): 30 Feet ?Assistive device: Rolling walker (2 wheels) ?Gait Pattern/deviations: Step-through pattern, Wide base of support, Knee flexed in stance - right, Knee flexed in stance - left ?General Gait Details:  requires supervision for safety ?Gait velocity: reduced ?Gait velocity interpretation: <1.31 ft/sec, indicative of household ambulator ?  ?ADL: ?ADL ?Overall ADL's : Needs assistance/impaired ?Eating/Feeding: Independent, Sitting ?Grooming: Min guard, Dance movement psychotherapist, Standing, Wash/dry hands ?Upper Body Bathing: Minimal assistance, Sitting ?Lower Body Bathing: Maximal assistance, +2 for safety/equipment, Sit to/from stand ?Upper Body Dressing : Set up, Sitting ?Lower Body Dressing: Maximal assistance, +2 for safety/equipment, Sit to/from stand ?Toilet Transfer: Min guard, Ambulation, Rolling walker (2 wheels), Grab bars, Regular Toilet ?Toileting- Clothing Manipulation and Hygiene: Minimal assistance, Sit to/from stand ?Toileting - Clothing Manipulation Details (indicate cue type and reason): Min A for balance and managing gown ?Functional mobility during ADLs: Min guard, Rolling walker (2 wheels), Minimal assistance ?General ADL Comments: Pt continues to present with decreased balance due to dizziness. Very motivated. Pt performing two "laps" in room, grooming, and toileting. Requiring standing breaks to gain her balance and find a spot to focus on for dizziness ?  ?Cognition: ?Cognition ?Overall Cognitive  Status: Within Functional Limits for tasks assessed ?Orientation Level: Oriented X4 ?Cognition ?Arousal/Alertness: Awake/alert ?Behavior During Therapy: [facility] for tasks assessed/performed ?Overall Cognitive Sta

## 2022-01-27 NOTE — Progress Notes (Signed)
[name], [name], MD  ?Physician ?Physical Medicine and Rehabilitation ?PMR Pre-admission     ?Signed ?Date of Service:  01/22/2022  3:24 PM ? Related encounter: ED to Hosp-Admission (Current) from 01/20/2022 in [place] ?  ?Signed    ?  ?Show:Clear all ?'[x]'$ Written'[x]'$ Templated'[]'$ Copied ? ?Added by: ?'[x]'$ Cristina Gong, RN'[x]'$ [name], [name], MD ? ?'[]'$ Hover for details ?   ?   ?   ?   ?   ?   ?   ?   ?   ?   ?   ?   ?   ?   ?   ?   ?   ?   ?   ?   ?   ?   ?   ?   ?   ?   ?   ?   ?   ?   ?   ?   ?   ?   ?   ?   ?   ?   ?   ?   ?   ?   ?   ?   ?   ?   ?   ?   ?   ?   ?   ?   ?   ?   ?   ?   ?   ?   ?   ?   ?   ?   ?   ?   ?   ?   ?   ?   ?   ?   ?   ?   ?   ?   ?   ?   ?   ?   ?   ?   ?   ?   ?   ?   ?   ?   ?   ?   ?   ?   ?   ?   ?   ?   ?   ?   ?   ?   ?   ?   ?   ?   ?   ?   ?   ?   ?   ?   ?   ?   ?   ?   ?   ?   ?   ?   ?   ?   ?   ?   ?   ?   ?   ?   ?   ?   ?   ?   ?   ?   ?   ?   ?   ?   ?   ?   ?   ?   ?   ?   ?   ?   ?   ?PMR Admission Coordinator Pre-Admission Assessment ?  ?Patient: Molly Patterson is an 88 y.o., female ?MRN: [id number] ?DOB: [birth year] ?Height: 5' 2.75" (159.4 cm) ?Weight: 57.9 kg ?  ?Insurance Information ?HMO: yes    PPO:      PCP:      IPA:      80/20:      OTHER:  ?PRIMARY: Blue Medicare      Policy#: [id number]      Subscriber: pt ?CM Name: Larene Beach      Phone#: [phone]     Fax#: [phone] ?Pre-Cert#: TBD approved for 7 days  Employer:  ?Benefits:  Phone #: [phone]     Name: 5/10 ?Eff. Date: 09/15/21     Deduct: none      Out of Pocket Max: $3600      Life Max: none ?CIR: $335 co pay per day days 1 until 6      SNF: no c co pay days 1 until 20; $196 co co pay per day days 21 until 60; no c pay days 61 until 100 ?Outpatient: $10 per visit     Co-Pay: visits per medical necessity ?Home Health: 100%      Co-Pay: visits per medical neccesity ?DME: 80%     Co-Pay: 20% ?Providers: in network ? ?SECONDARY: none ?  ?Financial Counselor:        Phone#:  ?  ?The ?Data Collection Information Summary? for patients in Inpatient Rehabilitation Facilities with attached ?Privacy Act [place] Records? was provided and verbally reviewed with: Patient and Family ?  ?Emergency Contact Information ?Contact Information   ?  ?  Name Relation Home Work Mobile  ?  Maricella, Filyaw Spouse [phone]      ?  ?   ?  ?Current Medical History  ?Patient Admitting Diagnosis: ICH ?  ?History of Present Illness: 88 year old female with history of  DM type II, HTN and osteoarthritis. 10 days prior to admit developed severe dizziness, spinning with N/V. Presented twice to [facility] on 01/20/22 with CT head showed a right cerebellar intraparenchymal hemorrhage. CTA showed no LVO, aneurysm or vascular malformation. Severe R P2 stenosis.  ?  ?Neurology consulted. Felt IPH in setting of uncontrolled HTN. MRI stable right cerebellar IPH, mild chronic microvascular disease. VTE Prophylaxis SCDs.  No antithrombotic pta and none secondary to IPH. Home meds of HCTZ. Initially placed on Cleviprex and using labetalol and hydralazine.  ?  ?Complete NIHSS TOTAL: 0 ?  ?Patient[facility] has been reviewed by the rehabilitation admission coordinator and physician. ?  ?Past Medical History  ?    ?Past Medical History:  ?Diagnosis Date  ? Allergy    ?  hayfever  ? ANEMIA-IRON DEFICIENCY 08/02/2009  ? BURSITIS, HIP 08/02/2009  ? COVID-19    ? HYPERTENSION 08/02/2009  ? OSTEOARTHRITIS, GENERALIZED 08/12/2010  ? Raynaud's syndrome 08/12/2010  ? Transfusion history 09/15/1962  ?  ?Has the patient had major surgery during 100 days prior to admission? No ?  ?Family History   ?family history is not on file. ?  ?Current Medications ?  ?Current Facility-Administered Medications:  ?  acetaminophen (TYLENOL) tablet 650 mg, 650 mg, Oral, Q4H PRN, 650 mg at 01/21/22 0354 **OR** [DISCONTINUED] acetaminophen (TYLENOL) 160 MG/5ML solution 650 mg, 650 mg, Per  Tube, Q4H PRN **OR** [DISCONTINUED] acetaminophen (TYLENOL) suppository 650 mg, 650 mg, Rectal, Q4H PRN, Derek Jack, MD ?  amLODipine (NORVASC) tablet 10 mg, 10 mg, Oral, Daily, de Yolanda Manges, [place] E, NP, 10 mg at 01/27/22 0855 ?  benazepril (LOTENSIN) tablet 10 mg, 10 mg, Oral, Daily, de Yolanda Manges, [place] E, NP, 10 mg at 01/27/22 0855 ?  cholecalciferol (VITAMIN D3) tablet 1,000 Units, 1,000 Units, Oral, Daily, Cherene Altes, MD, 1,000 Units at 01/27/22 0855 ?  feeding supplement (ENSURE ENLIVE / ENSURE PLUS) liquid 237 mL, 237 mL, Oral, BID BM, Derek Jack, MD, 237 mL at 01/27/22 0856 ?  hydrALAZINE (APRESOLINE) injection 20 mg, 20 mg, Intravenous, Q6H PRN, de Yolanda Manges, [place] E, NP ?  hydrochlorothiazide (HYDRODIURIL) tablet 12.5 mg,  12.5 mg, Oral, Daily, de Yolanda Manges, [place] E, NP, 12.5 mg at 01/27/22 3295 ?  labetalol (NORMODYNE) injection 10 mg, 10 mg, Intravenous, Q2H PRN, de Yolanda Manges, [place] E, NP ?  meclizine (ANTIVERT) tablet 12.5 mg, 12.5 mg, Oral, TID PRN, Derek Jack, MD, 12.5 mg at 01/27/22 0423 ?  multivitamin with minerals tablet 1 tablet, 1 tablet, Oral, Daily, Cherene Altes, MD, 1 tablet at 01/27/22 0855 ?  ondansetron (ZOFRAN) injection 4 mg, 4 mg, Intravenous, Q6H PRN, Shela Leff, MD, 4 mg at 01/27/22 0418 ?  pantoprazole (PROTONIX) EC tablet 40 mg, 40 mg, Oral, QHS, Garvin Fila, MD, 40 mg at 01/26/22 2003 ?  polyethylene glycol (MIRALAX / GLYCOLAX) packet 17 g, 17 g, Oral, Daily PRN, Janine Ores, NP, 17 g at 01/26/22 2045 ?  rosuvastatin (CRESTOR) tablet 10 mg, 10 mg, Oral, Daily, Rosalin Hawking, MD, 10 mg at 01/27/22 0855 ?  traMADol (ULTRAM) tablet 50 mg, 50 mg, Oral, Q6H PRN, Donnetta Simpers, MD, 50 mg at 01/26/22 2003 ?  vitamin B-12 (CYANOCOBALAMIN) tablet 1,000 mcg, 1,000 mcg, Oral, Daily, Cherene Altes, MD, 1,000 mcg at 01/27/22 0855 ?  ?Patients Current Diet:  ?Diet Order   ?  ?         ?    Diet regular Room service appropriate? Yes; Fluid  consistency: Thin  Diet effective now       ?  ?  ?   ?  ?  ?   ?  ?Precautions / Restrictions ?Precautions ?Precautions: Fall ?Precaution Comments: no nausea or dizziness today ?Restrictions ?Weight Bearing Restrictions: Yes  ?  ?Has the patient had 2 or more falls or a fall with injury in the past year? No ?  ?Prior Activity Level ?Community (5-7x/wk): independent, active and driving ?  ?Prior Functional Level ?Self Care: Did the patient need help bathing, dressing, using the toilet or eating? Independent ?  ?Indoor Mobility: Did the patient need assistance with walking from room to room (with or without device)? Independent ?  ?Stairs: Did the patient need assistance with internal or external stairs (with or without device)? Independent ?  ?Functional Cognition: Did the patient need help planning regular tasks such as shopping or remembering to take medications? Independent ?  ?Patient Information ?Are you of Hispanic, Latino/a,or Spanish origin?: A. No, not of Hispanic, Latino/a, or Spanish origin ?What is your race?: A. White ?Do you need or want an interpreter to communicate with a doctor or health care staff?: 0. No ?  ?Patient's Response To:  ?Health Literacy and Transportation ?Is the patient able to respond to health literacy and transportation needs?: Yes ?Health Literacy - How often do you need to have someone help you when you read instructions, pamphlets, or other written material from your doctor or pharmacy?: Never ?In the past 12 months, has lack of transportation kept you from medical appointments or from getting medications?: No ?In the past 12 months, has lack of transportation kept you from meetings, work, or from getting things needed for daily living?: No ?  ?Home Assistive Devices / Equipment ?Home Assistive Devices/Equipment: Eyeglasses, Hearing aid ?Home Equipment: Grab bars - toilet, Grab bars - tub/shower, Hand held shower head ?  ?Prior Device Use: Indicate devices/aids used by the  patient prior to current illness, exacerbation or injury? None of the above ?  ?Current Functional Level ?Cognition ?  Overall Cognitive Status: Within Functional Limits for tasks assessed ?Orientation Level

## 2022-01-27 NOTE — Progress Notes (Signed)
Pt arrived to unit A&O to 4 and alert and oriented to unit and room. Pt has no current c/o pain. C/o nausea with sudden movement. Pt aware and understands safety plan. Admission to be completed. No other issues to report.  ?

## 2022-01-27 NOTE — Progress Notes (Signed)
?  Inpatient Rehabilitation Admissions Coordinator  ? ?I have CIR bed to admit her to today and she is in agreement to admit. I have alerted acute team and TOC to make the arrangements to admit today. ? ?Danne Baxter, RN, MSN ?Rehab Admissions Coordinator ?[phone] ?01/27/2022 10:06 AM ? ?

## 2022-01-27 NOTE — Progress Notes (Signed)
Inpatient Rehabilitation Admission Medication Review by a Pharmacist ? ?A complete drug regimen review was completed for this patient to identify any potential clinically significant medication issues. ? ?High Risk Drug Classes Is patient taking? Indication by Medication  ?Antipsychotic Yes Prochlorperazine: PRN nausea/vomiting  ?Anticoagulant Yes Enoxaparin: VTE ppx  ?Antibiotic No   ?Opioid Yes Tramadol: PRN pain  ?Antiplatelet No   ?Hypoglycemics/insulin No   ?Vasoactive Medication Yes Amlodipine: hypertension  ?Chemotherapy No   ?Other Yes HCTZ, benazepril: hypertension ?Rosuvatatin: hyperlipidemia ?Meclizine: PRN vertigo, dizziness ?Pantoprazole: GERD ppx ?PEG,bisacodyl, Fleet enema:constipation ?Trazodone: PRN sleep  ? ? ? ?Type of Medication Issue Identified Description of Issue Recommendation(s)  ?Drug Interaction(s) (clinically significant) ?    ?Duplicate Therapy ?    ?Allergy ?    ?No Medication Administration End Date ?    ?Incorrect Dose ?    ?Additional Drug Therapy Needed ?    ?Significant med changes from prior encounter (inform family/care partners about these prior to discharge).    ?Other ?    ? ? ?Clinically significant medication issues were identified that warrant physician communication and completion of prescribed/recommended actions by midnight of the next day:  No ? ? ?Pharmacist comments: n/a ? ? ?Time spent performing this drug regimen review (minutes): 20 ? ? ?Thank you for allowing pharmacy to be a part of this patient?s care. ? ?Ardyth Harps, PharmD ?Clinical Pharmacist ? ?

## 2022-01-27 NOTE — Progress Notes (Signed)
Occupational Therapy Treatment ?Patient Details ?Name: Molly Patterson ?MRN: [id number] ?DOB: 1932/01/03 ?Today's Date: 01/27/2022 ? ? ?History of present illness 88 yo female who has been having dizziness and nausea with vomiting. She presented to [facility] 2x with first CT neg. CT at [facility] showed small acute IPH R cerebellum. PMH: HTN, OA, Raynaud's. ?  ?OT comments ? Pt progressing towards goals this session, able to complete toilet transfer and standing grooming tasks with min guard A using RW. Pt able to visually fixate on objects during mobility to reduce dizziness during session, pt BP WNL throughout session. Pt presenting with impairments listed below, will follow acutely. Continue to recommend AIR at d/c.  ? ?Recommendations for follow up therapy are one component of a multi-disciplinary discharge planning process, led by the attending physician.  Recommendations may be updated based on patient status, additional functional criteria and insurance authorization. ?   ?Follow Up Recommendations ? Acute inpatient rehab (3hours/day)  ?  ?Assistance Recommended at Discharge Frequent or constant Supervision/Assistance  ?Patient can return home with the following ? A little help with walking and/or transfers ?  ?Equipment Recommendations ? None recommended by OT;Other (comment) (defer to next venue of care)  ?  ?Recommendations for Other Services Rehab consult ? ?  ?Precautions / Restrictions Precautions ?Precautions: Fall ?Precaution Comments: nausea/dizziness/lightheadedness ?Restrictions ?Weight Bearing Restrictions: No  ? ? ?  ? ?Mobility Bed Mobility ?Overal bed mobility: Needs Assistance ?Bed Mobility: Rolling, Sidelying to Sit ?  ?  ?Supine to sit: Min guard ?Sit to supine: Min guard ?  ?General bed mobility comments: slow movement, anxious about eliciting symptoms. ?  ? ?Transfers ?Overall transfer level: Needs assistance ?Equipment used: Rolling walker (2 wheels) ?Transfers: Sit to/from Stand ?Sit to  Stand: Min assist ?  ?  ?  ?  ?  ?General transfer comment: assist for stability/confidence. cues for hand placement, compensation. ?  ?  ?Balance Overall balance assessment: Needs assistance ?Sitting-balance support: No upper extremity supported, Feet supported ?Sitting balance-Leahy Scale: Fair ?  ?  ?Standing balance support: Bilateral upper extremity supported, During functional activity ?Standing balance-Leahy Scale: Poor ?  ?  ?  ?  ?  ?  ?  ?  ?  ?  ?  ?  ?   ? ?ADL either performed or assessed with clinical judgement  ? ?ADL Overall ADL's : Needs assistance/impaired ?  ?  ?Grooming: Min guard;Wash/dry face;Standing;Wash/dry hands;Oral care ?  ?  ?  ?  ?  ?  ?  ?  ?  ?Toilet Transfer: Min guard;Ambulation;Rolling walker (2 wheels);Grab bars;Regular Toilet ?Toilet Transfer Details (indicate cue type and reason): increased time due to dizziness ?Toileting- Clothing Manipulation and Hygiene: Min guard;Sitting/lateral lean ?Toileting - Clothing Manipulation Details (indicate cue type and reason): for pericare ?  ?  ?Functional mobility during ADLs: Min guard;Rolling walker (2 wheels) ?  ?  ? ?Extremity/Trunk Assessment Upper Extremity Assessment ?Upper Extremity Assessment: Generalized weakness ?  ?Lower Extremity Assessment ?Lower Extremity Assessment: Defer to PT evaluation ?  ?  ?  ? ?Vision   ?Vision Assessment?: No apparent visual deficits ?Additional Comments: has glasses for reading in room ?  ?Perception Perception ?Perception: Not tested ?  ?Praxis Praxis ?Praxis: Not tested ?  ? ?Cognition Arousal/Alertness: Awake/alert ?Behavior During Therapy: [facility] for tasks assessed/performed ?Overall Cognitive Status: Within Functional Limits for tasks assessed ?  ?  ?  ?  ?  ?  ?  ?  ?  ?  ?  ?  ?  ?  ?  ?  ?  ?  ?  ?   ?  Exercises   ? ?  ?Shoulder Instructions   ? ? ?  ?General Comments BP WNL throughout session, pt visually compensatiing by fixating on target during mobility  ? ? ?Pertinent Vitals/ Pain       Pain  Assessment ?Pain Assessment: No/denies pain ? ?Home Living   ?  ?  ?  ?  ?  ?  ?  ?  ?  ?  ?  ?  ?  ?  ?  ?  ?  ?  ? ?  ?Prior Functioning/Environment    ?  ?  ?  ?   ? ?Frequency ? Min 2X/week  ? ? ? ? ?  ?Progress Toward Goals ? ?OT Goals(current goals can now be found in the care plan section) ? Progress towards OT goals: Progressing toward goals ? ?Acute Rehab OT Goals ?Patient Stated Goal: none stated ?OT Goal Formulation: With patient ?Time For Goal Achievement: 02/04/22 ?Potential to Achieve Goals: Good ?ADL Goals ?Pt Will Perform Grooming: with supervision;standing ?Pt Will Perform Lower Body Bathing: with supervision;sit to/from stand ?Pt Will Perform Lower Body Dressing: with supervision;sit to/from stand ?Pt Will Transfer to Toilet: with supervision;ambulating ?Pt Will Perform Tub/Shower Transfer: with min guard assist;ambulating ?Additional ADL Goal #1: Pt will indep complete bed mobility as a precursor to OOB ADLs  ?Plan Discharge plan remains appropriate   ? ?Co-evaluation ? ? ?   ?  ?  ?  ?  ? ?  ?AM-PAC OT "6 Clicks" Daily Activity     ?Outcome Measure ? ? Help from another person eating meals?: None ?Help from another person taking care of personal grooming?: A Little ?Help from another person toileting, which includes using toliet, bedpan, or urinal?: A Little ?Help from another person bathing (including washing, rinsing, drying)?: A Lot ?Help from another person to put on and taking off regular upper body clothing?: A Little ?Help from another person to put on and taking off regular lower body clothing?: A Lot ?6 Click Score: 17 ? ?  ?End of Session Equipment Utilized During Treatment: Rolling walker (2 wheels);Gait belt ? ?OT Visit Diagnosis: Unsteadiness on feet (R26.81);Other abnormalities of gait and mobility (R26.89);Muscle weakness (generalized) (M62.81);Dizziness and giddiness (R42) ?  ?Activity Tolerance Patient tolerated treatment well ?  ?Patient Left in bed;with call bell/phone within  reach;with bed alarm set;with family/visitor present ?  ?Nurse Communication Mobility status ?  ? ?   ? ?Time: 0786-7544 ?OT Time Calculation (min): 25 min ? ?Charges: OT General Charges ?$OT Visit: 1 Visit ?OT Treatments ?$Self Care/Home Management : 23-37 mins ? ?Lynnda Child, OTD, OTR/L ?Acute Rehab ?[phone] ? ? ?Kaylyn Lim ?01/27/2022, 3:30 PM ?

## 2022-01-27 NOTE — Progress Notes (Signed)
[name], [name], MD  ?Physician ?Physical Medicine and Rehabilitation ?PMR Pre-admission     ?Signed ?Date of Service:  01/22/2022  3:24 PM ?  ?Signed    ?  ?Show:Clear all ?'[x]'$ Written'[x]'$ Templated'[]'$ Copied ? ?Added by: ?'[x]'$ Molly Gong, RN'[x]'$ [name], [name], MD ? ?'[]'$ Hover for details ?   ?   ?   ?   ?   ?   ?   ?   ?   ?   ?   ?   ?   ?   ?   ?   ?   ?   ?   ?   ?   ?   ?   ?   ?   ?   ?   ?   ?   ?   ?   ?   ?   ?   ?   ?   ?   ?   ?   ?   ?   ?   ?   ?   ?   ?   ?   ?   ?   ?   ?   ?   ?   ?   ?   ?   ?   ?   ?   ?   ?   ?   ?   ?   ?   ?   ?   ?   ?   ?   ?   ?   ?   ?   ?   ?   ?   ?   ?   ?   ?   ?   ?   ?   ?   ?   ?   ?   ?   ?   ?   ?   ?   ?   ?   ?   ?   ?   ?   ?   ?   ?   ?   ?   ?   ?   ?   ?   ?   ?   ?   ?   ?   ?   ?   ?   ?   ?   ?   ?   ?   ?   ?   ?   ?   ?   ?   ?   ?   ?   ?   ?   ?   ?   ?   ?   ?   ?   ?   ?   ?   ?   ?   ?PMR Admission Coordinator Pre-Admission Assessment ?  ?Patient: Molly Patterson is an 88 y.o., female ?MRN: [id number] ?DOB: 1932-02-28 ?Height: 5' 2.75" (159.4 cm) ?Weight: 57.9 kg ?  ?Insurance Information ?HMO: yes    PPO:      PCP:      IPA:      80/20:      OTHER:  ?PRIMARY: Blue Medicare      Policy#: [id number]      Subscriber: pt ?CM Name: Larene Beach      Phone#: [phone]     Fax#: [phone] ?Pre-Cert#: TBD approved for 7 days      Employer:  ?Benefits:  Phone #: [phone]     Name: 5/10 ?Eff.  Date: 09/15/21     Deduct: none      Out of Pocket Max: $3600      Life Max: none ?CIR: $335 co pay per day days 1 until 6      SNF: no c co pay days 1 until 20; $196 co co pay per day days 21 until 60; no c pay days 61 until 100 ?Outpatient: $10 per visit     Co-Pay: visits per medical necessity ?Home Health: 100%      Co-Pay: visits per medical neccesity ?DME: 80%     Co-Pay: 20% ?Providers: in network ? ?SECONDARY: none ?  ?Financial Counselor:       Phone#:  ?  ?The ?Data Collection Information Summary? for patients in Inpatient Rehabilitation  Facilities with attached ?Privacy Act [place] Records? was provided and verbally reviewed with: Patient and Family ?  ?Emergency Contact Information ?Contact Information   ?  ?  Name Relation Home Work Mobile  ?  [name], [name] Spouse [phone]      ?  ?   ?  ?Current Medical History  ?Patient Admitting Diagnosis: ICH ?  ?History of Present Illness: 88 year old female with history of  DM type II, HTN and osteoarthritis. 10 days prior to admit developed severe dizziness, spinning with N/V. Presented twice to [facility] on 01/20/22 with CT head showed a right cerebellar intraparenchymal hemorrhage. CTA showed no LVO, aneurysm or vascular malformation. Severe R P2 stenosis.  ?  ?Neurology consulted. Felt IPH in setting of uncontrolled HTN. MRI stable right cerebellar IPH, mild chronic microvascular disease. VTE Prophylaxis SCDs.  No antithrombotic pta and none secondary to IPH. Home meds of HCTZ. Initially placed on Cleviprex and using labetalol and hydralazine.  ?  ?Complete NIHSS TOTAL: 0 ?  ?Patient's medical record from [facility] has been reviewed by the rehabilitation admission coordinator and physician. ?  ?Past Medical History  ?    ?Past Medical History:  ?Diagnosis Date  ? Allergy    ?  hayfever  ? ANEMIA-IRON DEFICIENCY 08/02/2009  ? BURSITIS, HIP 08/02/2009  ? COVID-19    ? HYPERTENSION 08/02/2009  ? OSTEOARTHRITIS, GENERALIZED 08/12/2010  ? Raynaud's syndrome 08/12/2010  ? Transfusion history 09/15/1962  ?  ?Has the patient had major surgery during 100 days prior to admission? No ?  ?Family History   ?family history is not on file. ?  ?Current Medications ?  ?Current Facility-Administered Medications:  ?  acetaminophen (TYLENOL) tablet 650 mg, 650 mg, Oral, Q4H PRN, 650 mg at 01/21/22 0354 **OR** [DISCONTINUED] acetaminophen (TYLENOL) 160 MG/5ML solution 650 mg, 650 mg, Per Tube, Q4H PRN **OR** [DISCONTINUED] acetaminophen (TYLENOL) suppository 650 mg, 650 mg, Rectal,  Q4H PRN, Derek Jack, MD ?  amLODipine (NORVASC) tablet 10 mg, 10 mg, Oral, Daily, de Yolanda Manges, [place] E, NP, 10 mg at 01/27/22 0855 ?  benazepril (LOTENSIN) tablet 10 mg, 10 mg, Oral, Daily, de Yolanda Manges, [place] E, NP, 10 mg at 01/27/22 0855 ?  cholecalciferol (VITAMIN D3) tablet 1,000 Units, 1,000 Units, Oral, Daily, Cherene Altes, MD, 1,000 Units at 01/27/22 0855 ?  feeding supplement (ENSURE ENLIVE / ENSURE PLUS) liquid 237 mL, 237 mL, Oral, BID BM, Derek Jack, MD, 237 mL at 01/27/22 3335 ?  hydrALAZINE (APRESOLINE) injection 20 mg, 20 mg, Intravenous, Q6H PRN, de Yolanda Manges, Cortney E, NP ?  hydrochlorothiazide (HYDRODIURIL) tablet 12.5 mg, 12.5 mg, Oral, Daily, de Yolanda Manges, [place] E, NP, 12.5 mg at 01/27/22  8588 ?  labetalol (NORMODYNE) injection 10 mg, 10 mg, Intravenous, Q2H PRN, de Yolanda Manges, Cortney E, NP ?  meclizine (ANTIVERT) tablet 12.5 mg, 12.5 mg, Oral, TID PRN, Derek Jack, MD, 12.5 mg at 01/27/22 0423 ?  multivitamin with minerals tablet 1 tablet, 1 tablet, Oral, Daily, Cherene Altes, MD, 1 tablet at 01/27/22 0855 ?  ondansetron (ZOFRAN) injection 4 mg, 4 mg, Intravenous, Q6H PRN, Shela Leff, MD, 4 mg at 01/27/22 0418 ?  pantoprazole (PROTONIX) EC tablet 40 mg, 40 mg, Oral, QHS, Garvin Fila, MD, 40 mg at 01/26/22 2003 ?  polyethylene glycol (MIRALAX / GLYCOLAX) packet 17 g, 17 g, Oral, Daily PRN, Janine Ores, NP, 17 g at 01/26/22 2045 ?  rosuvastatin (CRESTOR) tablet 10 mg, 10 mg, Oral, Daily, Rosalin Hawking, MD, 10 mg at 01/27/22 0855 ?  traMADol (ULTRAM) tablet 50 mg, 50 mg, Oral, Q6H PRN, Donnetta Simpers, MD, 50 mg at 01/26/22 2003 ?  vitamin B-12 (CYANOCOBALAMIN) tablet 1,000 mcg, 1,000 mcg, Oral, Daily, Cherene Altes, MD, 1,000 mcg at 01/27/22 0855 ?  ?Patients Current Diet:  ?Diet Order   ?  ?         ?    Diet regular Room service appropriate? Yes; Fluid consistency: Thin  Diet effective now       ?  ?  ?   ?  ?  ?   ?  ?Precautions /  Restrictions ?Precautions ?Precautions: Fall ?Precaution Comments: no nausea or dizziness today ?Restrictions ?Weight Bearing Restrictions: Yes  ?  ?Has the patient had 2 or more falls or a fall with injury in the past year? No ?  ?Prior Activity Level ?Community (5-7x/wk): independent, active and driving ?  ?Prior Functional Level ?Self Care: Did the patient need help bathing, dressing, using the toilet or eating? Independent ?  ?Indoor Mobility: Did the patient need assistance with walking from room to room (with or without device)? Independent ?  ?Stairs: Did the patient need assistance with internal or external stairs (with or without device)? Independent ?  ?Functional Cognition: Did the patient need help planning regular tasks such as shopping or remembering to take medications? Independent ?  ?Patient Information ?Are you of Hispanic, Latino/a,or Spanish origin?: A. No, not of Hispanic, Latino/a, or Spanish origin ?What is your race?: A. White ?Do you need or want an interpreter to communicate with a doctor or health care staff?: 0. No ?  ?Patient's Response To:  ?Health Literacy and Transportation ?Is the patient able to respond to health literacy and transportation needs?: Yes ?Health Literacy - How often do you need to have someone help you when you read instructions, pamphlets, or other written material from your doctor or pharmacy?: Never ?In the past 12 months, has lack of transportation kept you from medical appointments or from getting medications?: No ?In the past 12 months, has lack of transportation kept you from meetings, work, or from getting things needed for daily living?: No ?  ?Home Assistive Devices / Equipment ?Home Assistive Devices/Equipment: Eyeglasses, Hearing aid ?Home Equipment: Grab bars - toilet, Grab bars - tub/shower, Hand held shower head ?  ?Prior Device Use: Indicate devices/aids used by the patient prior to current illness, exacerbation or injury? None of the above ?  ?Current  Functional Level ?Cognition ?  Overall Cognitive Status: Within Functional Limits for tasks assessed ?Orientation Level: Oriented X4 ?General Comments: anxious initially due to fear of mobility exacerbating dizziness an

## 2022-01-27 NOTE — H&P (Shared)
? ? ?Physical Medicine and Rehabilitation Admission H&P ? ?  ?Chief Complaint  ?Patient presents with  ? Functional decline due to cerebellar bleed.   ? ? ?HPI: Molly Patterson. Molly Patterson is an 88 year old female with history of OA, HTN, Raynauds, vertigo, who was admitted with 10 day history of severe dizziness with N/V. She was seen in ED  4/30 and 5/5 at [facility] with negative CT.  She presented to [facility] on 01/20/22 with ongoing dizziness, nausea and inability to eat. She reported having a ICH about 30 years ago? (Dr. Hal Neer) that did not require any interventions. 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   CTA head/neck was  negative for LVO and showed severe right P2 stenosis. Dr. Eugenia Pancoast was  consulted for input and recommended observation --no surgical needs.  MRI brain revealed stable acute hemorrhage involving right cerebellun without lesion on non-contrast exam. 2D echo showed EF 60-65% with trivial AVR and MVR. Neurology felt that stroke was due to accelerated HTN and she was started on Clevidipine and transitioned to home dose HCTZ with good BP control. She continued to be limited by unsteady gait with intermittent dizziness affecting functional status. CIR recommended due to functional decline.  ? ? ?Review of Systems  ?Constitutional:  Negative for chills and fever.  ?HENT:  Positive for hearing loss.   ?Eyes:  Negative for blurred vision and double vision.  ?Respiratory:  Positive for cough (on and off since covid). Negative for shortness of breath.   ?Cardiovascular:  Negative for chest pain and palpitations.  ?Gastrointestinal:  Positive for nausea. Negative for constipation.  ?Musculoskeletal:  Negative for back pain, joint pain, myalgias and neck pain.  ?Skin:  Negative for itching and rash.  ?Neurological:  Positive for dizziness and weakness.  ?Psychiatric/Behavioral:  The patient is not nervous/anxious and does not have insomnia.    ? ? ?Past Medical History:  ?Diagnosis Date  ? Allergy   ? hayfever  ? ANEMIA-IRON DEFICIENCY 08/02/2009  ? Basal cell carcinoma (BCC) of right forearm   ? BURSITIS, HIP 08/02/2009  ? COVID-19   ? HYPERTENSION 08/02/2009  ? OSTEOARTHRITIS, GENERALIZED 08/12/2010  ? Raynaud's syndrome 08/12/2010  ? Transfusion history 09/15/1962  ? ? ?Past Surgical History:  ?Procedure Laterality Date  ? APPENDECTOMY    ? TONSILLECTOMY    ? ? ?Family History  ?Problem Relation Age of Onset  ? Arthritis Neg Hx   ?     family  ? Hypertension Neg Hx   ?     family  ? Diabetes Neg Hx   ?     family  ? Prostate cancer Neg Hx   ?     family  ? ? ?Social History:  Married. 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 She reports that she has never smoked. She has never used smokeless tobacco. She reports that she does not drink alcohol and does not use drugs. ? ? ?Allergies  ?Allergen Reactions  ? Penicillins Shortness Of Breath and Swelling  ?  REACTION: hives  ? Lidocaine-Epinephrine   ?  REACTION: pulse rapic  ? Rofecoxib   ?  Vioxx, itiching  ? ? ?Medications Prior to Admission  ?Medication Sig Dispense Refill  ? amLODipine (NORVASC) 10 MG tablet TAKE ONE (1) TABLET EACH DAY (Patient taking differently: Take 10 mg by mouth daily.) 90 tablet 0  ? azelastine (ASTELIN) 0.1 %  nasal spray Place 1 spray into both nostrils 2 (two) times daily. Use in each nostril as directed (Patient taking differently: Place 1 spray into both nostrils daily as needed for rhinitis or allergies.) 30 mL 12  ? benazepril (LOTENSIN) 10 MG tablet TAKE ONE (1) TABLET EACH DAY (Patient taking differently: Take 10 mg by mouth daily.) 90 tablet 0  ? cholecalciferol (VITAMIN D3) 25 MCG (1000 UNIT) tablet Take 1,000 Units by mouth daily.    ? hydrochlorothiazide (MICROZIDE) 12.5 MG capsule TAKE ONE (1) CAPSULE EACH DAY (Patient taking differently: Take 12.5 mg by mouth daily.) 90 capsule 3  ? meclizine (ANTIVERT) 12.5 MG tablet Take  1 tablet ever 6 hours as needed for vertigo. (Patient taking differently: Take 12.5 mg by mouth 3 (three) times daily as needed for dizziness or nausea.) 30 tablet 0  ? Multiple Vitamin (MULTIVITAMIN WITH MINERALS) TABS tablet Take 1 tablet by mouth daily.    ? ondansetron (ZOFRAN) 4 MG tablet Take 1 tablet every 8 hours as needed for nausea/vomiting (Patient taking differently: Take 4 mg by mouth every 8 (eight) hours as needed for nausea or vomiting.) 20 tablet 0  ? traMADol (ULTRAM) 50 MG tablet TAKE 1-2 TABLETS EVERY 6 HOURS AS NEEDED FOR PAIN (Patient taking differently: Take 50 mg by mouth every 6 (six) hours as needed for moderate pain or severe pain.) 180 tablet 0  ? VENTOLIN HFA 108 (90 Base) MCG/ACT inhaler USE 2 PUFFS EVERY 6 HOURS AS NEEDED (Patient taking differently: 2 puffs every 6 (six) hours as needed for wheezing or shortness of breath.) 18 g 1  ? vitamin B-12 (CYANOCOBALAMIN) 1000 MCG tablet Take 1,000 mcg by mouth daily.    ? ? ? ? ?Home: ?Home Living ?Family/patient expects to be discharged to:: Private residence ?Living Arrangements: Spouse/significant other ?Available Help at Discharge: Family, Available 24 hours/day ?Type of Home: House ?Home Access: Stairs to enter ?Entrance Stairs-Number of Steps: 7 ?Home Layout: Multi-level ?Alternate Level Stairs-Number of Steps: 7 ?Alternate Level Stairs-Rails: Right, Left ?Bathroom Shower/Tub: Tub/shower unit ?Bathroom Toilet: Standard ?Bathroom Accessibility: Yes ?Home Equipment: Grab bars - toilet, Grab bars - tub/shower, Hand held shower head ? Lives With: Spouse ?  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 ?Functional Status:  ?Mobility: ?Bed Mobility ?Overal bed mobility: Needs Assistance ?Bed Mobility: Rolling (scooting up bed) ?Rolling: Supervision ?Supine to sit: Min guard ?Sit to supine: Min  guard ?General bed mobility comments: declined OOB ?Transfers ?Overall transfer level: Needs assistance ?Equipment used: Rolling walker (2 wheels) ?Transfers: Sit to/from Stand ?Sit to Stand: Min guard ?Bed to/from chair/wheelchair/BSC transfer type:: Step pivot ?Step pivot transfers: Min assist ?General transfer comment: Min Guard A for safety ?Ambulation/Gait ?Ambulation/Gait assistance: Min guard, Min assist ?Gait Distance (Feet): 30 Feet ?Assistive device: Rolling walker (2 wheels) ?Gait Pattern/deviations: Step-through pattern, Wide base of support, Knee flexed in stance - right, Knee flexed in stance - left ?General Gait Details: requires supervision for safety ?Gait velocity: reduced ?Gait velocity interpretation: <1.31 ft/sec, indicative of household ambulator ?  ? ?ADL: ?ADL ?Overall ADL's : Needs assistance/impaired ?Eating/Feeding: Independent, Sitting ?Grooming: Min guard, Dance movement psychotherapist, Standing, Wash/dry hands ?Upper Body Bathing: Minimal assistance, Sitting ?Lower Body Bathing: Maximal assistance, +2 for safety/equipment, Sit to/from stand ?Upper Body Dressing : Set up, Sitting ?Lower Body Dressing: Maximal assistance, +2 for safety/equipment, Sit to/from stand ?Toilet Transfer: Min guard, Ambulation,  Rolling walker (2 wheels), Grab bars, Regular Toilet ?Toileting- Clothing Manipulation and Hygiene: Minimal assistance, Sit to/from stand ?Toileting - Clothing Manipulation Details (indicate cue type and reason): Min A for balance and managing gown ?Functional mobility during ADLs: Min guard, Rolling walker (2 wheels), Minimal assistance ?General ADL Comments: Pt continues to present with decreased balance due to dizziness. Very motivated. Pt performing two "laps" in room, grooming, and toileting. Requiring standing breaks to gain her balance and find a spot to focus on for dizziness ? ?Cognition: ?Cognition ?Overall Cognitive Status: Within Functional Limits for tasks assessed ?Orientation Level:  Oriented X4 ?Cognition ?Arousal/Alertness: Awake/alert ?Behavior During Therapy: [facility] for tasks assessed/performed ?Overall Cognitive Status: Within Functional Limits for tasks assessed ?General Comments: anxious ini

## 2022-01-27 NOTE — Plan of Care (Signed)
Pt is alert oriented x 4. Pt c/o constipation, prn miralax given. Pt c/o cramping. Pt assisted to bedside commode. Pt has large bowel movement, pt stated she felt better. No distress noted. Pt resting with eyes closed. Patient continues to wait bed in CIR.  ? ? ?Problem: Clinical Measurements: ?Goal: Ability to maintain clinical measurements within normal limits will improve ?Outcome: Progressing ?Goal: Will remain free from infection ?Outcome: Progressing ?Goal: Diagnostic test results will improve ?Outcome: Progressing ?Goal: Respiratory complications will improve ?Outcome: Progressing ?Goal: Cardiovascular complication will be avoided ?Outcome: Progressing ?  ?Problem: Activity: ?Goal: Risk for activity intolerance will decrease ?Outcome: Progressing ?  ?Problem: Nutrition: ?Goal: Adequate nutrition will be maintained ?Outcome: Progressing ?  ?Problem: Education: ?Goal: Knowledge of disease or condition will improve ?Outcome: Progressing ?Goal: Knowledge of secondary prevention will improve (SELECT ALL) ?Outcome: Progressing ?Goal: Knowledge of patient specific risk factors will improve (INDIVIDUALIZE FOR PATIENT) ?Outcome: Progressing ?Goal: Individualized Educational Video(s) ?Outcome: Progressing ?  ?Problem: Coping: ?Goal: Will verbalize positive feelings about self ?Outcome: Progressing ?Goal: Will identify appropriate support needs ?Outcome: Progressing ?  ?Problem: Self-Care: ?Goal: Ability to participate in self-care as condition permits will improve ?Outcome: Progressing ?Goal: Verbalization of feelings and concerns over difficulty with self-care will improve ?Outcome: Progressing ?Goal: Ability to communicate needs accurately will improve ?Outcome: Progressing ?  ?Problem: Nutrition: ?Goal: Risk of aspiration will decrease ?Outcome: Progressing ?Goal: Dietary intake will improve ?Outcome: Progressing ?  ?Problem: Intracerebral Hemorrhage Tissue Perfusion: ?Goal: Complications of Intracerebral Hemorrhage  will be minimized ?Outcome: Progressing ?  ?Problem: Ischemic Stroke/TIA Tissue Perfusion: ?Goal: Complications of ischemic stroke/TIA will be minimized ?Outcome: Progressing ?  ?Problem: Spontaneous Subarachnoid Hemorrhage Tissue Perfusion: ?Goal: Complications of Spontaneous Subarachnoid Hemorrhage will be minimized ?Outcome: Progressing ?  ?

## 2022-01-27 NOTE — Telephone Encounter (Signed)
Noted  

## 2022-01-27 NOTE — Progress Notes (Signed)
0400 ?Pt yelling out for help. Pt states she felt very nauseated, dry heaving. Pt placed on right side with emesis pan. On call provider called and asked for Zofran to be changed to IV. Medication given. Pt stated she was dreaming she was walking around and then woke up and moved in bed pt became very dizzy and nauseated. Pt given prn medication for dizziness. Medications effective. Pt has episode of stool incontinence, pt cleaned and pt resting. Pt stated she felt better. VS checked, WNL.  ?

## 2022-01-27 NOTE — Discharge Summary (Addendum)
DISCHARGE SUMMARY  Molly Patterson  MR#: [id number]  DOB:1931/11/30  Date of Admission: 01/20/2022 Date of Discharge: 01/27/2022  Attending Physician:Kirk Sampley Hennie Duos, MD  Patient's XIP:[name], Molly Sierras, MD  Consults: Neurology Neurosurgery  Disposition: D/C to CIR   Follow-up Appts:  Follow-up Information     [facility]. Schedule an appointment as soon as possible for a visit in 1 month(s).   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  She had been evaluated at [facility] ED on 2 separate occasions with a head CT on the first visit interpreted as revealing no acute intracranial process.  She did not have MRI imaging.  Due to persistence of her symptoms she presented to [facility] 5/8 at which time CT of the head revealed a small right cerebellar intraparenchymal hemorrhage.   Hospital Course:  Right cerebellar intraparenchymal hemorrhage in setting of uncontrolled hypertension CT head small acute IPH in right cerebellum CTA head & neck - no LVO, aneurysm or vascular malformation, severe right P2 stenosis MRI - stable right cerebellar IPH, mild chronic microvascular disease TTE noted EF 60-65% with normal LV function and no WMA with grade 1 DD and no evidence of an intracardiac source of embolism LDL 125 HgbA1c 5.9 Consider repeat MRI without contrast in 2-3 months to rule out underlying structural or vascular lesions   Uncontrolled hypertension - Malignant HTN (end organ damage noted - CVA) Was  previously on HCTZ alone -required Cleviprex during initial portion of hospital stay in setting of IPH -goal is strict blood pressure control with systolics less than 240 - blood pressure remains at goal at time of d/c    HLD Goal is LDL less than 70 - continue statin at discharge    Current Facility-Administered Medications:    acetaminophen (TYLENOL) tablet 650 mg, 650 mg, Oral, Q4H PRN, 650 mg at 01/21/22 0354 **OR** [DISCONTINUED] acetaminophen (TYLENOL) 160 MG/5ML solution 650 mg, 650 mg, Per Tube, Q4H PRN **OR** [DISCONTINUED] acetaminophen (TYLENOL) suppository 650 mg, 650 mg, Rectal, Q4H PRN, Derek Jack, MD   amLODipine (NORVASC) tablet 10 mg, 10 mg, Oral, Daily, de La Torre, Winn E, NP, 10 mg at 01/27/22 0855   benazepril (LOTENSIN) tablet 10 mg, 10 mg, Oral, Daily, de Yolanda Manges, Lacombe E, NP, 10 mg at 01/27/22 9735   cholecalciferol (VITAMIN D3) tablet 1,000 Units, 1,000 Units, Oral, Daily, Cherene Altes, MD, 1,000 Units at 01/27/22 0855   feeding supplement (ENSURE ENLIVE / ENSURE PLUS) liquid 237 mL, 237 mL, Oral, BID BM, Derek Jack, MD, 237 mL at 01/27/22 0856   hydrALAZINE (APRESOLINE) injection 20 mg, 20 mg, Intravenous, Q6H PRN, de Yolanda Manges, Cortney E, NP   hydrochlorothiazide (HYDRODIURIL) tablet 12.5 mg, 12.5 mg, Oral, Daily, de La Torre, Climax E, NP, 12.5 mg at 01/27/22 0855   labetalol (NORMODYNE) injection 10 mg, 10 mg, Intravenous, Q2H PRN, de Yolanda Manges, Cortney E, NP   meclizine (ANTIVERT) tablet 12.5 mg, 12.5 mg, Oral, TID PRN, Derek Jack, MD, 12.5 mg at 01/27/22 0423   multivitamin with minerals  tablet 1 tablet, 1 tablet, Oral, Daily, Cherene Altes, MD, 1 tablet at 01/27/22 0855   ondansetron (ZOFRAN) injection 4 mg, 4 mg, Intravenous, Q6H PRN, Shela Leff, MD, 4 mg at 01/27/22 0418   pantoprazole (PROTONIX) EC tablet 40 mg, 40 mg, Oral, QHS, Garvin Fila, MD, 40 mg at 01/26/22 2003   polyethylene glycol (MIRALAX / GLYCOLAX) packet  17 g, 17 g, Oral, Daily PRN, Janine Ores, NP, 17 g at 01/26/22 2045   rosuvastatin (CRESTOR) tablet 10 mg, 10 mg, Oral, Daily, Rosalin Hawking, MD, 10 mg at 01/27/22 0855   traMADol (ULTRAM) tablet 50 mg, 50 mg, Oral, Q6H PRN, Donnetta Simpers, MD, 50 mg at 01/26/22 2003   vitamin B-12 (CYANOCOBALAMIN) tablet 1,000 mcg, 1,000 mcg, Oral, Daily, Cherene Altes, MD, 1,000 mcg at 01/27/22 0045   Allergies  Allergen Reactions   Penicillins Shortness Of Breath and Swelling    REACTION: hives   Lidocaine-Epinephrine     REACTION: pulse rapic   Rofecoxib     Vioxx, itiching     Day of Discharge BP (!) 116/57 (BP Location: Left Arm)   Pulse 83   Temp 98.7 F (37.1 C) (Oral)   Resp 16   Ht 5' 2.75" (1.594 m)   Wt 57.9 kg   SpO2 94%   BMI 22.79 kg/m   Physical Exam: General: No acute respiratory distress Lungs: Clear to auscultation bilaterally without wheezes or crackles Cardiovascular: Regular rate and rhythm without murmur gallop or rub normal S1 and S2 Abdomen: Nontender, nondistended, soft, bowel sounds positive, no rebound, no ascites, no appreciable mass Extremities: No significant cyanosis, clubbing, or edema bilateral lower extremities  Basic Metabolic Panel: Recent Labs  Lab 01/20/22 1359 01/25/22 0105  NA 138 137  K 4.1 4.0  CL 102 101  CO2 27 29  GLUCOSE 119* 116*  BUN 9 22  CREATININE 0.62 0.87  CALCIUM 9.2 8.9   Liver Function Tests: Recent Labs  Lab 01/20/22 1359  AST 17  ALT 15  ALKPHOS 70  BILITOT 0.6  PROT 6.7  ALBUMIN 3.6   CBC: Recent Labs  Lab 01/20/22 1359 01/25/22 0105  WBC 9.6 12.8*  NEUTROABS 7.6  --   HGB 15.9* 13.4  HCT 49.1* 41.7  MCV 89.8 90.1  PLT 311 263    Time spent in discharge (includes decision making & examination of pt): 35 minutes  01/27/2022, 12:26 PM   Cherene Altes, MD [facility] Office  [phone]

## 2022-01-27 NOTE — TOC Transition Note (Signed)
Transition of Care (TOC) - CM/SW Discharge Note ? ? ?Patient Details  ?Name: Molly Patterson ?MRN: [id number] ?Date of Birth: May 12, 1932 ? ?Transition of Care (TOC) CM/SW Contact:  ?Pollie Friar, RN ?Phone Number: ?01/27/2022, 10:13 AM ? ? ?Clinical Narrative:    ?Patient is discharging to CIR today. CM signing off.  ? ? ?Final next level of care: [place] ?Barriers to Discharge: No Barriers Identified ? ? ?Patient Goals and CMS Choice ?  ?CMS Medicare.gov Compare Post Acute Care list provided to:: Patient ?Choice offered to / list presented to : Patient ? ?Discharge Placement ?  ?           ?  ?  ?  ?  ? ?Discharge Plan and Services ?  ?Discharge Planning Services: CM Consult ?Post Acute Care Choice: IP Rehab          ?  ?  ?  ?  ?  ?  ?  ?  ?  ?  ? ?Social Determinants of Health (SDOH) Interventions ?  ? ? ?Readmission Risk Interventions ?   ? View : No data to display.  ?  ?  ?  ? ? ? ? ? ?

## 2022-01-28 ENCOUNTER — Ambulatory Visit: Payer: Medicare Other | Admitting: Family Medicine

## 2022-01-28 DIAGNOSIS — I614 Nontraumatic intracerebral hemorrhage in cerebellum: Secondary | ICD-10-CM | POA: Diagnosis not present

## 2022-01-28 LAB — COMPREHENSIVE METABOLIC PANEL
ALT: 21 U/L (ref 0–44)
AST: 17 U/L (ref 15–41)
Albumin: 3 g/dL — ABNORMAL LOW (ref 3.5–5.0)
Alkaline Phosphatase: 63 U/L (ref 38–126)
Anion gap: 6 (ref 5–15)
BUN: 14 mg/dL (ref 8–23)
CO2: 30 mmol/L (ref 22–32)
Calcium: 9.2 mg/dL (ref 8.9–10.3)
Chloride: 103 mmol/L (ref 98–111)
Creatinine, Ser: 0.69 mg/dL (ref 0.44–1.00)
GFR, Estimated: >60 mL/min (ref >60)
Glucose, Bld: 94 mg/dL (ref 70–99)
Potassium: 3.7 mmol/L (ref 3.5–5.1)
Sodium: 139 mmol/L (ref 135–145)
Total Bilirubin: 0.8 mg/dL (ref 0.3–1.2)
Total Protein: 5.9 g/dL — ABNORMAL LOW (ref 6.5–8.1)

## 2022-01-28 LAB — CBC WITH DIFFERENTIAL/PLATELET
Abs Immature Granulocytes: 0.12 10*3/uL — ABNORMAL HIGH (ref 0.00–0.07)
Basophils Absolute: 0.1 10*3/uL (ref 0.0–0.1)
Basophils Relative: 1 %
Eosinophils Absolute: 0.4 10*3/uL (ref 0.0–0.5)
Eosinophils Relative: 4 %
HCT: 41.1 % (ref 36.0–46.0)
Hemoglobin: 13.4 g/dL (ref 12.0–15.0)
Immature Granulocytes: 1 %
Lymphocytes Relative: 16 %
Lymphs Abs: 1.6 10*3/uL (ref 0.7–4.0)
MCH: 29.2 pg (ref 26.0–34.0)
MCHC: 32.6 g/dL (ref 30.0–36.0)
MCV: 89.5 fL (ref 80.0–100.0)
Monocytes Absolute: 0.9 10*3/uL (ref 0.1–1.0)
Monocytes Relative: 10 %
Neutro Abs: 6.5 10*3/uL (ref 1.7–7.7)
Neutrophils Relative %: 68 %
Platelets: 236 10*3/uL (ref 150–400)
RBC: 4.59 MIL/uL (ref 3.87–5.11)
RDW: 15.6 % — ABNORMAL HIGH (ref 11.5–15.5)
WBC: 9.7 10*3/uL (ref 4.0–10.5)
nRBC: 0 % (ref 0.0–0.2)

## 2022-01-28 MED ORDER — TRAMADOL HCL 50 MG PO TABS
50.0000 mg | ORAL_TABLET | Freq: Four times a day (QID) | ORAL | Status: DC | PRN
Start: 2022-01-28 — End: 2022-02-04
  Administered 2022-01-28 – 2022-02-03 (×8): 50 mg via ORAL
  Filled 2022-01-28 (×9): qty 1

## 2022-01-28 NOTE — Progress Notes (Signed)
Patient ID: Molly Patterson, female   DOB: August 29, 1932, 88 y.o.   MRN: [id number] ?Met with the patient and spouse to review rehab process, team conference and plan of care. Discussed secondary risks including HTN, dyslipidemia and medications. Patient does not feel a statin is necessary; declines need. Reviewed dietary modification recommendations for vitamin deficiencies. Discussed constipation management; patient noted she had not been eating or drinking within the past two weeks and does not feel this is a usual issue. No other concerns noted at present. Continue to follow along to discharge to address educational needs to facilitate preparation for discharge home with spouse. Dorien Chihuahua B ? ?

## 2022-01-28 NOTE — Evaluation (Signed)
Physical Therapy Assessment and Plan ? ?Patient Details  ?Name: Molly Patterson ?MRN: [id number] ?Date of Birth: [birth year] ? ?PT Diagnosis: Abnormality of gait and Difficulty walking ?Rehab Potential: Good ?ELOS: 7-10 days  ? ?Today's Date: 01/28/2022 ?PT Individual Time: 5852-7782 ?PT Individual Time Calculation (min): 75 min   ? ?Hospital Problem: Principal Problem: ?  Cerebellar bleed ([place]) ? ? ?Past Medical History:  ?Past Medical History:  ?Diagnosis Date  ? Allergy   ? hayfever  ? ANEMIA-IRON DEFICIENCY 08/02/2009  ? Basal cell carcinoma (BCC) of right forearm   ? BURSITIS, HIP 08/02/2009  ? COVID-19   ? HYPERTENSION 08/02/2009  ? OSTEOARTHRITIS, GENERALIZED 08/12/2010  ? Raynaud's syndrome 08/12/2010  ? Transfusion history 09/15/1962  ? Vertigo   ? ?Past Surgical History:  ?Past Surgical History:  ?Procedure Laterality Date  ? APPENDECTOMY    ? TONSILLECTOMY    ? ? ?Assessment & Plan ?Clinical Impression: Patient is a 88 y.o. year old female with history of OA, HTN, Raynauds, vertigo, who was admitted with 10 day history of severe dizziness with N/V. She was seen in ED  4/30 and 5/5 at [facility] with negative CT.  She presented to [facility] on 01/20/22 with ongoing dizziness, nausea and inability to eat. She reported having a ICH about 30 years ago? (Dr. Hal Neer) that did not require any interventions. 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   CTA head/neck was  negative for LVO and showed severe right P2 stenosis. Dr. Eugenia Pancoast was  consulted for input and recommended observation --no surgical needs.  MRI brain revealed stable acute hemorrhage involving right cerebellun without lesion on non-contrast exam. 2D echo showed EF 60-65% with trivial AVR and MVR. Neurology felt that stroke was due to accelerated HTN and she was started on Clevidipine and transitioned to home dose HCTZ with good BP control. She continued to be limited by unsteady gait with  intermittent dizziness affecting functional status. CIR recommended due to functional decline.  Patient transferred to CIR on 01/27/2022 .  ? ?Patient currently requires min assist with mobility secondary to   , decreased cardiorespiratoy endurance, impaired timing and sequencing and unbalanced muscle activation,  ,  ,  , and decreased standing balance, decreased postural control, and decreased balance strategies.  Prior to hospitalization, patient was independent  with mobility and lived with Spouse in a House home.  Home access is 7Stairs to enter. ? ?Patient will benefit from skilled PT intervention to maximize safe functional mobility, minimize fall risk, and decrease caregiver burden for planned discharge home with 24 hour supervision.  Anticipate patient will benefit from follow up OP at discharge. ? ?PT - End of Session ?Activity Tolerance: Tolerates 30+ min activity with multiple rests ?Endurance Deficit: Yes ?Endurance Deficit Description: generalized weakness, decreased tolerance to positional changes due to medical diagnosis ?PT Assessment ?Rehab Potential (ACUTE/IP ONLY): Good ?PT Barriers to Discharge: Home environment access/layout ?PT Patient demonstrates impairments in the following area(s): Balance;Safety;Behavior;Sensory;Edema;Skin Integrity;Endurance;Motor;Nutrition;Pain;Perception ?PT Transfers Functional Problem(s): Bed Mobility;Bed to Chair;Car;Furniture;Floor ?PT Locomotion Functional Problem(s): Ambulation;Stairs ?PT Plan ?PT Intensity: Minimum of 1-2 x/day ,45 to 90 minutes ?PT Frequency: 5 out of 7 days ?PT Duration Estimated Length of Stay: 7-10 days ?PT Treatment/Interventions: Ambulation/gait training;Community reintegration;DME/adaptive equipment instruction;Neuromuscular re-education;Psychosocial support;Stair training;UE/LE Strength taining/ROM;Balance/vestibular training;Discharge planning;Pain management;Functional electrical stimulation;Skin care/wound management;Therapeutic  Activities;UE/LE Coordination activities;Cognitive remediation/compensation;Disease management/prevention;Functional mobility training;Patient/family education;Splinting/orthotics;Therapeutic Exercise;Visual/perceptual remediation/compensation ?PT Transfers Anticipated Outcome(s): mod-I using LRAD ?PT Locomotion Anticipated Outcome(s): supervision ?  PT Recommendation ?Recommendations for Other Services: Therapeutic Recreation consult ?Therapeutic Recreation Interventions: Outing/community reintergration;Kitchen group ?Follow Up Recommendations: Outpatient PT;24 hour supervision/assistance ?Patient destination: Home ?Equipment Recommended: To be determined ? ? ?PT Evaluation ?Precautions/Restrictions ?Precautions ?Precautions: Fall;Other (comment) ?Precaution Comments: nausea/dizziness/lightheadedness, hard of hearing ?Restrictions ?Weight Bearing Restrictions: No ?Pain ?Pain Assessment ?Pain Scale: 0-10 ?Pain Score: 0-No pain ?Pain Interference ?Pain Interference ?Pain Effect on Sleep: 4. Almost constantly ("every single day for years" "my joints are crap" stating the pain is due to OA) ?Pain Interference with Therapy Activities: 1. Rarely or not at all ?Pain Interference with Day-to-Day Activities: 1. Rarely or not at all ?Home Living/Prior Functioning ?Home Living ?Available Help at Discharge: Family;Available 24 hours/day ?Type of Home: House ?Home Access: Stairs to enter ?Entrance Stairs-Number of Steps: 7 ?Entrance Stairs-Rails: Right;Left ?Home Layout: Multi-level;Laundry or work area in basement ?Alternate Level Stairs-Number of Steps: 7 ?Alternate Level Stairs-Rails: Right;Left ?Bathroom Shower/Tub: Tub/shower unit ?Bathroom Toilet: Standard ?Bathroom Accessibility: Yes ? Lives With: Spouse (husband, Fritz Pickerel) ?Prior Function ?Level of Independence: Independent with gait;Independent with transfers;Independent with homemaking with ambulation ? Able to Take Stairs?: Yes ?Driving: Yes ?Vocation: Retired ?Vocation  Requirements: retired Therapist, sports ?Vision/Perception  ?Vision - History ?Ability to See in Adequate Light: 1 Impaired ?Vision - Assessment ?Additional Comments: has glasses for reading ?Perception ?Perception: Within Functional Limits ?Praxis ?Praxis: Intact  ?Cognition ?Overall Cognitive Status: Within Functional Limits for tasks assessed ?Arousal/Alertness: Awake/alert ?Orientation Level: Oriented X4 ?Year: 2023 ?Month: May ?Day of Week: Correct ?Attention: Focused;Sustained;Selective ?Focused Attention: Appears intact ?Sustained Attention: Appears intact ?Selective Attention: Appears intact ?Memory: Appears intact ?Awareness: Appears intact ?Safety/Judgment: Appears intact ?Sensation ?Sensation ?Light Touch: Appears Intact ?Hot/Cold: Not tested ?Proprioception: Appears Intact ?Stereognosis: Not tested ?Coordination ?Gross Motor Movements are Fluid and Coordinated: Yes ?Fine Motor Movements are Fluid and Coordinated: Yes ?Coordination and Movement Description: GM movements are fluid and coordinated for basic functional mobility tasks; however, pt with repeated R lateral LOB ?Motor  ?Motor ?Motor: Other (comment) ?Motor - Skilled Clinical Observations: motion sensitivity, decreased stamina, R lateral lean/LOB during mobility  ? ?Trunk/Postural Assessment  ?Cervical Assessment ?Cervical Assessment: Within Functional Limits ?Thoracic Assessment ?Thoracic Assessment: Within Functional Limits ?Lumbar Assessment ?Lumbar Assessment: Within Functional Limits ?Postural Control ?Postural Control: Deficits on evaluation ?Righting Reactions: delayed and inadequate for larger LOB ?Postural Limitations: decreased  ?Balance ?Balance ?Balance Assessed: Yes ?Standardized Balance Assessment ?Standardized Balance Assessment: Merrilee Jansky Balance Test ?Merrilee Jansky Balance Test ?Sit to Stand: Able to stand  independently using hands ?Standing Unsupported: Able to stand 2 minutes with supervision ?Sitting with Back Unsupported but Feet Supported on Floor or  Stool: Able to sit 2 minutes under supervision ?Stand to Sit: Controls descent by using hands ?Transfers: Able to transfer with verbal cueing and /or supervision ?Standing Unsupported with Eyes Closed: Able to

## 2022-01-28 NOTE — Progress Notes (Signed)
Inpatient Rehabilitation  Patient information reviewed and entered into eRehab system by Jacquelyn Antony Shane Badeaux, OTR/L.   Information including medical coding, functional ability and quality indicators will be reviewed and updated through discharge.    

## 2022-01-28 NOTE — Progress Notes (Signed)
Initial Nutrition Assessment ? ?DOCUMENTATION CODES:  ? ?Not applicable ? ?INTERVENTION:  ? ?Continue Multivitamin w/ minerals daily ?Continue Ensure Enlive po BID, each supplement provides 350 kcal and 20 grams of protein. ?Encourage good PO intake ? ?NUTRITION DIAGNOSIS:  ? ?Increased nutrient needs related to other (see comment) (rehab therapies) as evidenced by estimated needs. ? ?GOAL:  ? ?Patient will meet greater than or equal to 90% of their needs ? ?MONITOR:  ? ?PO intake, Supplement acceptance ? ?REASON FOR ASSESSMENT:  ? ?Malnutrition Screening Tool ?  ? ?ASSESSMENT:  ? ?88 y.o. female admitted to CIR after small R cerebellar intraparenchymal hemorrhage. PMH includes HTN and osteoporosis.  ? ? ?Pt reports that her appetite is improving and that she is trying to eat better. Reports that the portions that she receives here are much larger than she receives at home. Pt reports that for 10 days PTA she was very weak and eating poorly. Pt endorses some nausea, without vomiting; states that RN provided her with medication that help.  ?Per EMR, pt PO intake includes 50% dinner yesterday and 25% of breakfast today.  ? ?Discussed ONS, pt reports that she enjoys drinking them. RD to continue order for them. Pt reports that she prefers them iced cold, RD encourage pt to ask for a cup of ice from RN to pour Ensure over. ? ?Pt with no other questions or concerns at this time.  ? ?Medications reviewed and include: Vitamin D3, MVI, Protonix, Vitamin B12 ?Labs reviewed.  ? ?NUTRITION - FOCUSED PHYSICAL EXAM: ? ?Pt declined at this time.  ? ?Diet Order:   ?Diet Order   ? ?       ?  Diet regular Room service appropriate? Yes; Fluid consistency: Thin  Diet effective now       ?  ? ?  ?  ? ?  ? ?EDUCATION NEEDS:  ? ?No education needs have been identified at this time ? ?Skin:  Skin Assessment: Reviewed RN Assessment ? ?Last BM:  5/15 ? ?Height:  ?Ht Readings from Last 1 Encounters:  ?01/27/22 '5\' 2"'$  (1.575 m)  ? ?Weight: ?Wt  Readings from Last 1 Encounters:  ?01/27/22 54.8 kg  ? ?Ideal Body Weight:  50 kg ? ?BMI:  Body mass index is 22.1 kg/m?. ? ?Estimated Nutritional Needs:  ?Kcal:  1600-1800 ?Protein:  80-95 grams ?Fluid:  >/= 1.6 L ? ? ? ?Hermina Barters RD, LDN ?Clinical Dietitian ?See AMiON for contact information.  ?

## 2022-01-28 NOTE — Plan of Care (Signed)
?  Problem: Consults ?Goal: RH STROKE PATIENT EDUCATION ?Description: See Patient Education module for education specifics  ?Outcome: Progressing ?  ?Problem: RH SAFETY ?Goal: RH STG ADHERE TO SAFETY PRECAUTIONS W/ASSISTANCE/DEVICE ?Description: STG Adhere to Safety Precautions With cues Assistance/Device. ?Outcome: Progressing ?  ?Problem: RH KNOWLEDGE DEFICIT ?Goal: RH STG INCREASE KNOWLEDGE OF DIABETES ?Description: Patient and spouse will be able to manage DM with medications and dietary modifications using handouts and educational resources independently ?Outcome: Progressing ?Goal: RH STG INCREASE KNOWLEDGE OF HYPERTENSION ?Description: Patient and spouse will be able to manage HTN with medications and dietary modifications using handouts and educational resources independently ?Outcome: Progressing ?Goal: RH STG INCREASE KNOWLEGDE OF HYPERLIPIDEMIA ?Description: Patient and spouse will be able to manage HLD with medications and dietary modifications using handouts and educational resources independently ?Outcome: Progressing ?  ?

## 2022-01-28 NOTE — Progress Notes (Signed)
Inpatient Rehabilitation Care Coordinator ?Assessment and Plan ?Patient Details  ?Name: Molly Patterson ?MRN: [id number] ?Date of Birth: 09-Sep-1932 ? ?Today's Date: 01/28/2022 ? ?Hospital Problems: Principal Problem: ?  Cerebellar bleed ([place]) ? ?Past Medical History:  ?Past Medical History:  ?Diagnosis Date  ? Allergy   ? hayfever  ? ANEMIA-IRON DEFICIENCY 08/02/2009  ? Basal cell carcinoma (BCC) of right forearm   ? BURSITIS, HIP 08/02/2009  ? COVID-19   ? HYPERTENSION 08/02/2009  ? OSTEOARTHRITIS, GENERALIZED 08/12/2010  ? Raynaud's syndrome 08/12/2010  ? Transfusion history 09/15/1962  ? Vertigo   ? ?Past Surgical History:  ?Past Surgical History:  ?Procedure Laterality Date  ? APPENDECTOMY    ? TONSILLECTOMY    ? ?Social History:  reports that she has never smoked. She has never used smokeless tobacco. She reports that she does not drink alcohol and does not use drugs. ? ?Family / Support Systems ?Marital Status: Married ?Patient Roles: Spouse ?Spouse/Significant Other: Faylene Kurtz ?Anticipated Caregiver: spouse and family ?Ability/Limitations of Caregiver: none ?Caregiver Availability: 24/7 ?Family Dynamics: support from spouse ? ?Social History ?Preferred language: English ?Religion:  ?Health Literacy - How often do you need to have someone help you when you read instructions, pamphlets, or other written material from your doctor or pharmacy?: Never ?Writes: Yes ?Employment Status: Retired ?Legal History/Current Legal Issues: n/a ?Guardian/Conservator: n/a  ? ?Abuse/Neglect ?Abuse/Neglect Assessment Can Be Completed: Yes ?Physical Abuse: Denies ?Verbal Abuse: Denies ?Sexual Abuse: Denies ?Exploitation of patient/patient's resources: Denies ?Self-Neglect: Denies ? ?Patient response to: ?Social Isolation - How often do you feel lonely or isolated from those around you?: Never ? ?Emotional Status ?Recent Psychosocial Issues: coping ?Psychiatric History: n/a ?Substance Abuse History: n/a ? ?Patient / Family  Perceptions, Expectations & Goals ?Pt/Family understanding of illness & functional limitations: yes ?Premorbid pt/family roles/activities: Previously independent and driving ?Anticipated changes in roles/activities/participation: spouse able to assist if needed ?Pt/family expectations/goals: MOD I to Intermittent Supervision ? ?Community Resources ?Community Agencies: None ?Premorbid Home Care/DME Agencies: Other (Comment) ([employer], [facility]) ?Transportation available at discharge: spouse able to transport ?Is the patient able to respond to transportation needs?: Yes ?In the past 12 months, has lack of transportation kept you from medical appointments or from getting medications?: No ?In the past 12 months, has lack of transportation kept you from meetings, work, or from getting things needed for daily living?: No ? ?Discharge Planning ?Living Arrangements: Spouse/significant other ?Support Systems: Spouse/significant other ?Type of Residence: Private residence ?Insurance Resources: Multimedia programmer (specify) (BCBS MEDICARE) ?Financial Resources: [employer], Social Security ?Financial Screen Referred: No ?Living Expenses: Own ?Money Management: Patient, Spouse ?Does the patient have any problems obtaining your medications?: No ?Home Management: Independent ?Patient/Family Preliminary Plans: Patient plans to remain independent ?Care Coordinator Anticipated Follow Up Needs: HH/OP ?Expected length of stay: 7-10 Days ? ?Clinical Impression ?SW met with patient and spouse, introduced self and explained role. The patient plans to discharge home with spouse. Sw will provide updates to patient and spouse on Thursday. ? ?Dyanne Iha ?01/28/2022, 12:16 PM ? ?  ?

## 2022-01-28 NOTE — Progress Notes (Signed)
Physical Therapy Session Note ? ?Patient Details  ?Name: Molly Patterson ?MRN: [id number] ?Date of Birth: 12-13-1931 ? ?Today's Date: 01/28/2022 ?PT Individual Time: 3149-7026 ?PT Individual Time Calculation (min): 63 min  ? ?Short Term Goals: ?Week 1:  PT Short Term Goal 1 (Week 1): = to LTGs based on ELOS ? ?Skilled Therapeutic Interventions/Progress Updates:  ?  Pt received sitting on EOB with nurse present and pt agreeable to therapy session. L stand pivot EOB>w/c, no AD, with CGA for steadying.  Transported to/from gym in w/c for time management and energy conservation. Gait training ~11f, no AD nor UE support, with CGA/light min assist for balance - pt continues to ambulate with decreased gait speed and guarded upper body and head posturing - achieves reciprocal stepping pattern. ? ?Dynamic gait training focusing on head rotations and sudden start/stops while locating numbered disks 1-10 in hallway - continues to require CGA with intermittent min assist for balance especially with head turns - requires significantly increased time to complete this task with poor awareness of where the targets are located in the hallway and poor short term recall of where the numbers she previously found were located.  ? ?Dynamic stepping balance strategy via forward/backwards and lateral stepping over hockey sticks - more impaired balance noticed with backwards stepping and pt having to take take a few "recovery" steps to regain her balance - requires light min assist for balance. ? ?Progressed to 4 square stepping over hockey sticks with pt initially having significant difficulty understanding the sequence of the task but then repeated 2nd time with much improved motor planning and sequencing progressed to performing CGA and having quicker steps with no significant LOB noted.  ? ?Pt requires seated rest break and cool washcloth due to onset of feeling nauseous - this improved symptoms with pt able to continue with  session.. ? ?Progressed to alternate foot taps to 4" step then to stepping forward/backwards on/off the step without UE support requiring CGA/light min assist for balance and pt continuing to demonstrate an improvement in her balance. ? ?Transported back to room and pt requesting to return to bed. R stand pivot to EOB, no AD, with CGA. Sit>supine with supervision. Pt left supported upright in bed with needs in reach, bed alarm on, and meal tray set-up. ? ? ?Therapy Documentation ?Precautions:  ?Precautions ?Precautions: Fall ?Precaution Comments: nausea/dizziness/lightheadedness, hard of hearing, motion sensitivity ?Restrictions ?Weight Bearing Restrictions: No ? ? ?Pain: ?No reports of pain throughout session. ? ? ?Therapy/Group: Individual Therapy ? ?CTawana Scale, PT, DPT, NCS, CSRS ?01/28/2022, 5:06 PM  ?

## 2022-01-28 NOTE — Progress Notes (Signed)
Inpatient Rehabilitation Center ?Individual Statement of Services ? ?Patient Name:  Molly Patterson  ?Date:  01/28/2022 ? ?Welcome to the [facility].  Our goal is to provide you with an individualized program based on your diagnosis and situation, designed to meet your specific needs.  With this comprehensive rehabilitation program, you will be expected to participate in at least 3 hours of rehabilitation therapies Monday-Friday, with modified therapy programming on the weekends. ? ?Your rehabilitation program will include the following services:  Physical Therapy (PT), Occupational Therapy (OT), Speech Therapy (ST), 24 hour per day rehabilitation nursing, Therapeutic Recreaction (TR), Neuropsychology, Care Coordinator, Rehabilitation Medicine, Nutrition Services, Pharmacy Services, and Other ? ?Weekly team conferences will be held on Wednesdays to discuss your progress.  Your Inpatient Rehabilitation Care Coordinator will talk with you frequently to get your input and to update you on team discussions.  Team conferences with you and your family in attendance may also be held. ? ?Expected length of stay: 7-10 Days  Overall anticipated outcome:  MOD I to Intermittent Supervision ? ?Depending on your progress and recovery, your program may change. Your Inpatient Rehabilitation Care Coordinator will coordinate services and will keep you informed of any changes. Your Inpatient Rehabilitation Care Coordinator's name and contact numbers are listed  below. ? ?The following services may also be recommended but are not provided by the [facility]:  ? ?Home Health Rehabiltiation Services ?Outpatient Rehabilitation Services ? ?  ?Arrangements will be made to provide these services after discharge if needed.  Arrangements include referral to agencies that provide these services. ? ?Your insurance has been verified to be:  [employer]  ?Your primary doctor is:   Carolann Littler,  MD ? ?Pertinent information will be shared with your doctor and your insurance company. ? ?Inpatient Rehabilitation Care Coordinator:  Erlene Quan, [place] or ([phone] ? ?Information discussed with and copy given to patient by: Dyanne Iha, 01/28/2022, 12:06 PM    ?

## 2022-01-28 NOTE — Plan of Care (Signed)
?  Problem: RH Balance ?Goal: LTG: Patient will maintain dynamic sitting balance (OT) ?Description: LTG:  Patient will maintain dynamic sitting balance with assistance during activities of daily living (OT) ?Flowsheets (Taken 01/28/2022 1610) ?LTG: Pt will maintain dynamic sitting balance during ADLs with: Independent ?Goal: LTG Patient will maintain dynamic standing with ADLs (OT) ?Description: LTG:  Patient will maintain dynamic standing balance with assist during activities of daily living (OT)  ?Flowsheets (Taken 01/28/2022 1611) ?LTG: Pt will maintain dynamic standing balance during ADLs with: Independent with assistive device ?  ?

## 2022-01-28 NOTE — Plan of Care (Signed)
?  Problem: RH Balance ?Goal: LTG Patient will maintain dynamic sitting balance (PT) ?Description: LTG:  Patient will maintain dynamic sitting balance with assistance during mobility activities (PT) ?Flowsheets (Taken 01/28/2022 1818) ?LTG: Pt will maintain dynamic sitting balance during mobility activities with:: Independent with assistive device  ?Goal: LTG Patient will maintain dynamic standing balance (PT) ?Description: LTG:  Patient will maintain dynamic standing balance with assistance during mobility activities (PT) ?Flowsheets (Taken 01/28/2022 1818) ?LTG: Pt will maintain dynamic standing balance during mobility activities with:: Supervision/Verbal cueing ?  ?Problem: Sit to Stand ?Goal: LTG:  Patient will perform sit to stand with assistance level (PT) ?Description: LTG:  Patient will perform sit to stand with assistance level (PT) ?Flowsheets (Taken 01/28/2022 1818) ?LTG: PT will perform sit to stand in preparation for functional mobility with assistance level: Independent with assistive device ?  ?Problem: RH Bed Mobility ?Goal: LTG Patient will perform bed mobility with assist (PT) ?Description: LTG: Patient will perform bed mobility with assistance, with/without cues (PT). ?Flowsheets (Taken 01/28/2022 1818) ?LTG: Pt will perform bed mobility with assistance level of: Independent with assistive device  ?  ?Problem: RH Bed to Chair Transfers ?Goal: LTG Patient will perform bed/chair transfers w/assist (PT) ?Description: LTG: Patient will perform bed to chair transfers with assistance (PT). ?Flowsheets (Taken 01/28/2022 1818) ?LTG: Pt will perform Bed to Chair Transfers with assistance level: Independent with assistive device  ?  ?Problem: RH Car Transfers ?Goal: LTG Patient will perform car transfers with assist (PT) ?Description: LTG: Patient will perform car transfers with assistance (PT). ?Flowsheets (Taken 01/28/2022 1818) ?LTG: Pt will perform car transfers with assist:: Supervision/Verbal cueing ?   ?Problem: RH Ambulation ?Goal: LTG Patient will ambulate in controlled environment (PT) ?Description: LTG: Patient will ambulate in a controlled environment, # of feet with assistance (PT). ?Flowsheets (Taken 01/28/2022 1818) ?LTG: Pt will ambulate in controlled environ  assist needed:: Supervision/Verbal cueing ?LTG: Ambulation distance in controlled environment: 140f using LRAD ?Goal: LTG Patient will ambulate in home environment (PT) ?Description: LTG: Patient will ambulate in home environment, # of feet with assistance (PT). ?Flowsheets (Taken 01/28/2022 1818) ?LTG: Pt will ambulate in home environ  assist needed:: Independent with assistive device ?LTG: Ambulation distance in home environment: 532fusing LRAD ?  ?Problem: RH Stairs ?Goal: LTG Patient will ambulate up and down stairs w/assist (PT) ?Description: LTG: Patient will ambulate up and down # of stairs with assistance (PT) ?Flowsheets (Taken 01/28/2022 1818) ?LTG: Pt will ambulate up/down stairs assist needed:: Supervision/Verbal cueing ?LTG: Pt will  ambulate up and down number of stairs: 12 steps using HRs per home set-up ?  ?

## 2022-01-28 NOTE — Evaluation (Signed)
Occupational Therapy Assessment and Plan ? ?Patient Details  ?Name: Molly Patterson ?MRN: [id number] ?Date of Birth: Jul 30, 1932 ? ?OT Diagnosis: abnormal posture, muscle weakness (generalized), and unsteadiness on feet ?Rehab Potential:  Excellent ?ELOS: 7-10 days  ? ?Today's Date: 01/28/2022 ?OT Individual Time: 6195-0932 ?OT Individual Time Calculation (min): 75 min    ? ?Hospital Problem: Principal Problem: ?  Cerebellar bleed ([place]) ? ? ?Past Medical History:  ?Past Medical History:  ?Diagnosis Date  ? Allergy   ? hayfever  ? ANEMIA-IRON DEFICIENCY 08/02/2009  ? Basal cell carcinoma (BCC) of right forearm   ? BURSITIS, HIP 08/02/2009  ? COVID-19   ? HYPERTENSION 08/02/2009  ? OSTEOARTHRITIS, GENERALIZED 08/12/2010  ? Raynaud's syndrome 08/12/2010  ? Transfusion history 09/15/1962  ? Vertigo   ? ?Past Surgical History:  ?Past Surgical History:  ?Procedure Laterality Date  ? APPENDECTOMY    ? TONSILLECTOMY    ? ? ?Assessment & Plan ?Clinical Impression: Patient is a 88 y.o. year old female admitted with 10 day history of severe dizziness with N/V. She was seen in ED  4/30 and 5/5 at [facility] with negative CT.  She presented to [facility] on 01/[facility]/22 with ongoing dizziness, nausea and inability to eat. She reported having a ICH about 30 years ago? (Dr. Hal Neer) that did not require any interventions. 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   Patient transferred to CIR on 01/27/2022 .   ? ?Patient currently requires min with basic self-care skills secondary to decreased cardiorespiratoy endurance, unbalanced muscle activation and motion sensitivity  Prior to hospitalization, patient could complete ADL/IADL with independent . ? ?Patient will benefit from skilled intervention to increase independence with basic self-care skills and increase level of independence with iADL prior to discharge home with care partner.  Anticipate patient will require intermittent  supervision and follow up home health. ? ?OT - End of Session ?Activity Tolerance: Tolerates < 10 min activity, no significant change in vital signs ?Endurance Deficit: Yes ?Endurance Deficit Description: deconditioned, generalized weakness following prolonged illness, does not tolerate positional changes well ?OT Assessment ?OT Barriers to Discharge: Other (comments) ?OT Barriers to Discharge Comments: Motion sensitivity ?OT Patient demonstrates impairments in the following area(s): Balance;Endurance;Motor;Safety ?OT Basic ADL's Functional Problem(s): Bathing;Dressing;Toileting ?OT Advanced ADL's Functional Problem(s): Simple Meal Preparation;Laundry;Light Housekeeping ?OT Transfers Functional Problem(s): Toilet;Tub/Shower ?OT Additional Impairment(s): None ?OT Plan ?OT Intensity: Minimum of 1-2 x/day, 45 to 90 minutes ?OT Frequency: 5 out of 7 days ?OT Duration/Estimated Length of Stay: 7-10 days ?OT Treatment/Interventions: Balance/vestibular training;Neuromuscular re-education;Self Care/advanced ADL retraining;Therapeutic Exercise;DME/adaptive equipment instruction;Pain management;UE/LE Strength taining/ROM;Patient/family education;Functional mobility training;Therapeutic Activities ?OT Self Feeding Anticipated Outcome(s): independent ?OT Basic Self-Care Anticipated Outcome(s): Modified independent ?OT Toileting Anticipated Outcome(s): Modified Independent ?OT Bathroom Transfers Anticipated Outcome(s): Modified Independent ?OT Recommendation ?Patient destination: Home ?Follow Up Recommendations: Home health OT ?Equipment Recommended: To be determined ?Equipment Details: Patient has never needed equipment - anticipate rolling walker, and shower seat to be determined, possibly BSC ? ? ?OT Evaluation ?Precautions/Restrictions  ?Precautions ?Precautions: Fall ?Precaution Comments: nausea/dizziness/lightheadedness, hard of hearing, motion sensitivity ?Restrictions ?Weight Bearing Restrictions: No ?General ?Chart  Reviewed: Yes ?Additional Pertinent History: OA, HTN, Raynauds, vertigo ?Family/Caregiver Present: No ?Vital Signs ?Therapy Vitals ?Pulse Rate: 79 ?Resp: 16 ?BP: 115/63 ?Patient Position (if appropriate): Lying ?Oxygen Therapy ?SpO2: 99 % ?O2 Device: Room Air ?Pain ?Pain Assessment ?Pain Scale: 0-10 ?Pain Score: 0-No pain ?Home Living/Prior Functioning ?Home Living ?Family/patient expects to be discharged  to:: Private residence ?Living Arrangements: Spouse/significant other ?Available Help at Discharge: Family, Available 24 hours/day ?Type of Home: House ?Home Access: Stairs to enter ?Entrance Stairs-Number of Steps: 7 ?Entrance Stairs-Rails: Right, Left ?Home Layout: Multi-level, Laundry or work area in basement ?Alternate Level Stairs-Number of Steps: 7 ?Alternate Level Stairs-Rails: Right, Left ?Bathroom Shower/Tub: Tub/shower unit ?Bathroom Toilet: Standard ?Bathroom Accessibility: Yes ? Lives With: Spouse ?IADL History ?Homemaking Responsibilities: Yes ?Meal Prep Responsibility: Primary ?Laundry Responsibility: Primary ?Cleaning Responsibility: Primary ?Current License: Yes ?Mode of Transportation: Car ?Occupation: Retired ?Type of Occupation: Therapist, sports ?Leisure and Hobbies: Gardening, Medical laboratory scientific officer own food ?Prior Function ?Level of Independence: Independent with basic ADLs, Independent with homemaking with ambulation ? Able to Take Stairs?: Yes ?Driving: Yes ?Vocation: Retired ?Vocation Requirements: retired Therapist, sports ?Vision ?Baseline Vision/History: 1 Wears glasses ?Ability to See in Adequate Light: 0 Adequate ?Patient Visual Report: No change from baseline ?Vision Assessment?: No apparent visual deficits ?Additional Comments: ha sglasses for reading ?Perception  ?Perception: Within Functional Limits ?Praxis ?Praxis: Intact ?Cognition ?Cognition ?Overall Cognitive Status: Within Functional Limits for tasks assessed ?Arousal/Alertness: Awake/alert ?Memory: Appears intact ?Attention: Focused;Sustained;Selective ?Focused  Attention: Appears intact ?Sustained Attention: Appears intact ?Selective Attention: Appears intact ?Awareness: Appears intact ?Problem Solving: Appears intact ?Executive Function: Organizing;Initiating;Self Monitoring;Self Correcting ?Organizing: Appears intact ?Initiating: Appears intact ?Self Monitoring: Appears intact ?Self Correcting: Appears intact ?Safety/Judgment: Appears intact ?Brief Interview for Mental Status (BIMS) ?Repetition of Three Words (First Attempt): 3 ?Temporal Orientation: Year: Correct ?Temporal Orientation: Month: Accurate within 5 days ?Temporal Orientation: Day: Correct ?Recall: "Sock": Yes, no cue required ?Recall: "Blue": Yes, no cue required ?Recall: "Bed": Yes, no cue required ?BIMS Summary Score: 15 ?Sensation ?Sensation ?Light Touch: Appears Intact ?Hot/Cold: Not tested ?Proprioception: Appears Intact ?Stereognosis: Not tested ?Coordination ?Gross Motor Movements are Fluid and Coordinated: Yes ?Fine Motor Movements are Fluid and Coordinated: Yes ?Coordination and Movement Description: During simple ADL - gross motor coordinations not restricting functional activity.  Patient with limited movement tolerance ?Motor  ?Motor ?Motor: Within Functional Limits ?Motor - Skilled Clinical Observations: motion sensitivity, limited stamina  ?Trunk/Postural Assessment  ?Cervical Assessment ?Cervical Assessment: Within Functional Limits ?Thoracic Assessment ?Thoracic Assessment: Within Functional Limits ?Lumbar Assessment ?Lumbar Assessment: Within Functional Limits ?Postural Control ?Postural Control: Within Functional Limits (limited endurance)  ?Balance ?Balance ?Balance Assessed: Yes ?Berg Balance Test ?Sit to Stand: Able to stand  independently using hands ?Standing Unsupported: Able to stand 30 seconds unsupported ?Sitting with Back Unsupported but Feet Supported on Floor or Stool: Able to sit safely and securely 2 minutes ?Stand to Sit: Controls descent by using hands ?Transfers: Needs one  person to assist ?Standing Unsupported with Eyes Closed: Able to stand 10 seconds with supervision ?Static Sitting Balance ?Static Sitting - Balance Support: No upper extremity supported;Feet supported ?Static Sitti

## 2022-01-28 NOTE — H&P (Signed)
?                                                       PROGRESS NOTE ? ? ?Subjective/Complaints: ? ?Labs reviewed normal  ?Felt tired and had "cold sweats"  when up for a while this am with OT, feels better in bed  ?ROS- neg CP, SOB, N/V/D ?Objective: ?  ?No results found. ?Recent Labs  ?  01/28/22 ?0541  ?WBC 9.7  ?HGB 13.4  ?HCT 41.1  ?PLT 236  ? ?Recent Labs  ?  01/28/22 ?0541  ?NA 139  ?K 3.7  ?CL 103  ?CO2 30  ?GLUCOSE 94  ?BUN 14  ?CREATININE 0.69  ?CALCIUM 9.2  ? ? ?Intake/Output Summary (Last 24 hours) at 01/28/2022 0818 ?Last data filed at 01/27/2022 1851 ?Gross per 24 hour  ?Intake 120 ml  ?Output --  ?Net 120 ml  ?  ? ?  ? ?Physical Exam: ?Vital Signs ?Blood pressure 116/74, pulse 77, temperature 97.8 ?F (36.6 ?C), resp. rate 18, height '5\' 2"'$  (1.575 m), weight 54.8 kg, SpO2 98 %. ? ?General: No acute distress ?Mood and affect are appropriate ?Heart: Regular rate and rhythm no rubs murmurs or extra sounds ?Lungs: Clear to auscultation, breathing unlabored, no rales or wheezes ?Abdomen: Positive bowel sounds, soft nontender to palpation, nondistended ?Extremities: No clubbing, cyanosis, or edema ?Skin: No evidence of breakdown, no evidence of rash ?Neurologic: Cranial nerves II through XII intact, motor strength is 5/5 in bilateral deltoid, bicep, tricep, grip, hip flexor, knee extensors, ankle dorsiflexor and plantar flexor ?Sensory exam normal sensation to light touch and proprioception in bilateral upper and lower extremities ?Cerebellar exam normal finger to nose to finger as well as heel to shin in bilateral upper and lower extremities ?Musculoskeletal: Full range of motion in all 4 extremities. No joint swelling ? ? ? ?Assessment/Plan: ?1. Functional deficits which require 3+ hours per day of interdisciplinary therapy in a comprehensive inpatient rehab setting. ?Physiatrist is providing close team supervision and 24 hour management of active medical problems listed below. ?Physiatrist and rehab team  continue to assess barriers to discharge/monitor patient progress toward functional and medical goals ? ?Care Tool: ? ?Bathing ?   ?   ?   ?  ?  ?Bathing assist   ?  ?  ?Upper Body Dressing/Undressing ?Upper body dressing   ?  ?   ?Upper body assist   ?   ?Lower Body Dressing/Undressing ?Lower body dressing ? ? ?   ?  ? ?  ? ?Lower body assist   ?   ? ?Toileting ?Toileting    ?Toileting assist   ?  ?  ?Transfers ?Chair/bed transfer ? ?Transfers assist ?   ? ?  ?  ?  ?Locomotion ?Ambulation ? ? ?Ambulation assist ? ?   ? ?  ?  ?   ? ?Walk 10 feet activity ? ? ?Assist ?   ? ?  ?   ? ?Walk 50 feet activity ? ? ?Assist   ? ?  ?   ? ? ?Walk 150 feet activity ? ? ?Assist   ? ?  ?  ?  ? ?Walk 10 feet on uneven surface  ?activity ? ? ?Assist   ? ? ?  ?   ? ?Wheelchair ? ? ? ? ?Assist   ?  ?  ? ?  ?   ? ? ?  Wheelchair 50 feet with 2 turns activity ? ? ? ?Assist ? ?  ?  ? ? ?   ? ?Wheelchair 150 feet activity  ? ? ? ?Assist ?   ? ? ?   ? ?Blood pressure 116/74, pulse 77, temperature 97.8 ?F (36.6 ?C), resp. rate 18, height '5\' 2"'$  (1.575 m), weight 54.8 kg, SpO2 98 %. ? ?Medical Problem List and Plan: ?1. 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            -patient may  shower ?            -ELOS/Goals: 7d, Mod I/Sup goals for ADL and Mobility  ?2.  Antithrombotics: ?-DVT/anticoagulation:  Pharmaceutical: Lovenox ?            -antiplatelet therapy: N/A due to bleed ?3. Pain Management: tylenol prn.  ?4. Mood: LCSW to follow for evaluation and support.  ?            -antipsychotic agents: N/A ?5. Neuropsych: This patient is capable of making decisions on her own behalf. ?No apparent cognitive deficits ?6. Skin/Wound Care: Routine pressure relief measures.  ?7. Fluids/Electrolytes/Nutrition: Monitor I/O. BMET nl  ?8. HTN: Monitor BP TID. Stable on home dose meds ?--Continue HCTZ, Amlodipine and Benazepril  ?  ?Vitals:  ? 01/27/22 1945 01/28/22 0430  ?BP: 116/68 116/74  ?Pulse: 98  77  ?Resp: 18 18  ?Temp: 98.4 ?F (36.9 ?C) 97.8 ?F (36.6 ?C)  ?SpO2: 99% 98%  ?  ?9. Hyper lipidemia: On Statin (but does not plan on continuing it at discharge) ?            --continue to educate on compliance.  ?10. Leucocytosis: Monitor for fevers and other signs of infection ?11. Vestibular symptoms:continue  PPI for GI prophylaxis.   ?--Has an episode last night that woke her from sleep/needed meds ?            --vestibular eval and treat.  ? 12.  DIzziness not clearly central vestibular, BMET nl ,   check ortho vitals x 1  ? ?LOS: ?1 days ?A FACE TO FACE EVALUATION WAS PERFORMED ? ?Molly Patterson ?01/28/2022, 8:18 AM  ? ? ? ?

## 2022-01-29 DIAGNOSIS — I614 Nontraumatic intracerebral hemorrhage in cerebellum: Secondary | ICD-10-CM | POA: Diagnosis not present

## 2022-01-29 MED ORDER — ONDANSETRON 4 MG PO TBDP
4.0000 mg | ORAL_TABLET | Freq: Three times a day (TID) | ORAL | Status: DC | PRN
  Administered 2022-01-29 – 2022-01-31 (×3): 4 mg via ORAL
  Filled 2022-01-29 (×4): qty 1

## 2022-01-29 NOTE — Progress Notes (Signed)
Physical Therapy Session Note  Patient Details  Name: Molly Patterson MRN: [id number] Date of Birth: 29-Nov-1931  Today's Date: 01/29/2022 PT Individual Time: 5038-8828 PT Individual Time Calculation (min): 30 min   Short Term Goals: Week 1:  PT Short Term Goal 1 (Week 1): = to LTGs based on ELOS   Skilled Therapeutic Interventions/Progress Updates:   Pt received supine in bed and agreeable to PT, and requesting to use restroom.. Supine>sit transfer with out assist or cues. ambulatory transfer to toilet with RW and min assist for safety over threshold into bathroom. Pt abel to perform hygiene without assist and min assist to manage pants folling BM and urination. Hand hygiene standing at sink with supervision assist for safety.   Gait training with RW through hall 2 x 164f and cues direction. Gait training without AD x 1220fwith min assist due to mild L lateral bias. Pt requiring cues throughout that PT would remain close by and not let her fall with and without AD as pt is very fearful of falling throughout gait training, requiring multiple prolonged rest breaks between bouts. Cues for gaze stabilization with turns to reduce vertigo with mild improvement reported by pt.   Patient returned to room and left sitting  EOB with RN present, call bell in reach and all needs met.         Therapy Documentation Precautions:  Precautions Precautions: Fall Precaution Comments: nausea/dizziness/lightheadedness, hard of hearing, motion sensitivity Restrictions Weight Bearing Restrictions: No  Vital Signs: Therapy Vitals Temp: (!) 97.3 F (36.3 C) Pulse Rate: 90 Resp: 16 BP: 125/63 Patient Position (if appropriate): Lying Oxygen Therapy SpO2: 97 % O2 Device: Room Air Pain: denies    Therapy/Group: Individual Therapy  AuLorie Phenix/17/2023, 5:30 PM

## 2022-01-29 NOTE — Patient Care Conference (Signed)
Inpatient RehabilitationTeam Conference and Plan of Care Update Date: 01/29/2022   Time: 10:56 AM    Patient Name: Molly Patterson      Medical Record Number: [id number]  Date of Birth: July 29, 1932 Sex: Female         Room/Bed: 4W23C/4W23C-01 Payor Info: Payor: [place] / Plan: BCBS MEDICARE / Product Type: *No Product type* /    Admit Date/Time:  01/27/2022  4:05 PM  Primary Diagnosis:  Cerebellar bleed [facility])  Hospital Problems: Principal Problem:   Cerebellar bleed [facility])    Expected Discharge Date: Expected Discharge Date: 02/04/22  Team Members Present: Physician leading conference: Dr. Alysia Penna Social Worker Present: Loralee Pacas, [place] Nurse Present: Dorien Chihuahua, RN PT Present: Page Spiro, PT OT Present: Willeen Cass, OT SLP Present: Sherren Kerns, SLP PPS Coordinator present : Gunnar Fusi, SLP     Current Status/Progress Goal Weekly Team Focus  Bowel/Bladder   pt cont of b and b lbm 01/28/22  remain cont of ba nd b  assessq shift and prn   Swallow/Nutrition/ Hydration             ADL's   Min assist  modified independent  Improve tolerance to standing, activity, decrease motion sensitivity, increase general endurance   Mobility   supervision bed mobility, CGA sit<>stand and stand pivot transfers, min assist gait ~269f without AD, light min assist 8 steps using B HRs  mod-I overall at household ambulatory level  pt education, activity tolerance, dynamic standing balance, dynamic gait training   Communication             Safety/Cognition/ Behavioral Observations            Pain   pt c/o generalized arthritis pain 6/10  decrease pain  administer prn medication with pain   Skin   no current skin breakdown           Discharge Planning:      Team Discussion: Patient with nausea; MD changed to zofran.   Patient on target to meet rehab goals: yes, currently min assist overall. Limited by postural sway; able to  correct if minor. Completes sit - stand with CGA and ambulation with min assist without a device. Goals for discharge set for mod I overall.  *See Care Plan and progress notes for long and short-term goals.   Revisions to Treatment Plan:  N/A   Teaching Needs: Safety, medications, stair management, etc  Current Barriers to Discharge: Decreased caregiver support and Home enviroment access/layout  Possible Resolutions to Barriers: Family education OP follow up services     Medical Summary Current Status: poor intake , nauseated, blance improving  Barriers to Discharge: Medical stability;Other (comments)   Possible Resolutions to [employer] work on nausea, trial zofran prior to meals   Continued Need for Acute Rehabilitation Level of Care: The patient requires daily medical management by a physician with specialized training in physical medicine and rehabilitation for the following reasons: Direction of a multidisciplinary physical rehabilitation program to maximize functional independence : Yes Medical management of patient stability for increased activity during participation in an intensive rehabilitation regime.: Yes Analysis of laboratory values and/or radiology reports with any subsequent need for medication adjustment and/or medical intervention. : Yes   I attest that I was present, lead the team conference, and concur with the assessment and plan of the team.   SDorien ChihuahuaB 01/29/2022, 3:57 PM

## 2022-01-29 NOTE — Progress Notes (Signed)
?                                                       PROGRESS NOTE ? ? ?Subjective/Complaints: ? ?Nauseated this am , took IV compazine , now feels "out of it" ?No abd pain , no vomiting no hiccups ?Per PT , normal score on 9 hole Peg test  ? ?ROS- Neg CP, SOB, +nausea  ? ?Objective: ?  ?No results found. ?Recent Labs  ?  01/28/22 ?0541  ?WBC 9.7  ?HGB 13.4  ?HCT 41.1  ?PLT 236  ? ?Recent Labs  ?  01/28/22 ?0541  ?NA 139  ?K 3.7  ?CL 103  ?CO2 30  ?GLUCOSE 94  ?BUN 14  ?CREATININE 0.69  ?CALCIUM 9.2  ? ? ?Intake/Output Summary (Last 24 hours) at 01/29/2022 4332 ?Last data filed at 01/28/2022 2128 ?Gross per 24 hour  ?Intake 235 ml  ?Output --  ?Net 235 ml  ?  ? ?  ? ?Physical Exam: ?Vital Signs ?Blood pressure 105/67, pulse 81, temperature 98.2 ?F (36.8 ?C), resp. rate 15, height '5\' 2"'  (1.575 m), weight 54.8 kg, SpO2 96 %. ? ? ?General: No acute distress ?Mood and affect are appropriate ?Heart: Regular rate and rhythm no rubs murmurs or extra sounds ?Lungs: Clear to auscultation, breathing unlabored, no rales or wheezes ?Abdomen: Positive bowel sounds, soft nontender to palpation, nondistended ?Extremities: No clubbing, cyanosis, or edema ?Skin: No evidence of breakdown, no evidence of rash ?Neurologic: Cranial nerves II through XII intact, motor strength is 5/5 in bilateral deltoid, bicep, tricep, grip, hip flexor, knee extensors, ankle dorsiflexor and plantar flexor ?Sensory exam normal sensation to light touch and proprioception in bilateral upper and lower extremities ?Cerebellar exam normal finger to nose to finger as well as heel to shin in bilateral upper and lower extremities ?Musculoskeletal: Full range of motion in all 4 extremities. No joint swelling ? ? ?Assessment/Plan: ?1. Functional deficits which require 3+ hours per day of interdisciplinary therapy in a comprehensive inpatient rehab setting. ?Physiatrist is providing close team supervision and 24 hour management of active medical problems listed  below. ?Physiatrist and rehab team continue to assess barriers to discharge/monitor patient progress toward functional and medical goals ? ?Care Tool: ? ?Bathing ?   ?Body parts bathed by patient: Right arm, Left arm, Chest, Abdomen, Front perineal area, Buttocks, Right upper leg, Left upper leg, Right lower leg, Left lower leg, Face  ?   ?  ?  ?Bathing assist Assist Level: Contact Guard/Touching assist ?  ?  ?Upper Body Dressing/Undressing ?Upper body dressing   ?What is the patient wearing?: Pull over shirt, Bra ?   ?Upper body assist Assist Level: Supervision/Verbal cueing ?   ?Lower Body Dressing/Undressing ?Lower body dressing ? ? ?   ?What is the patient wearing?: Pants, Underwear/pull up ? ?  ? ?Lower body assist Assist for lower body dressing: Contact Guard/Touching assist ?   ? ?Toileting ?Toileting    ?Toileting assist Assist for toileting: Contact Guard/Touching assist ?  ?  ?Transfers ?Chair/bed transfer ? ?Transfers assist ?   ? ?Chair/bed transfer assist level: Minimal Assistance - Patient > 75% ?  ?  ?Locomotion ?Ambulation ? ? ?Ambulation assist ? ?   ? ?Assist level: Minimal Assistance - Patient > 75% ?Assistive device: Hand held assist ?Max distance:  128f  ? ?Walk 10 feet activity ? ? ?Assist ?   ? ?Assist level: Minimal Assistance - Patient > 75% ?Assistive device: Hand held assist  ? ?Walk 50 feet activity ? ? ?Assist   ? ?Assist level: Minimal Assistance - Patient > 75% ?Assistive device: Hand held assist  ? ? ?Walk 150 feet activity ? ? ?Assist   ? ?Assist level: Minimal Assistance - Patient > 75% ?Assistive device: Hand held assist ?  ? ?Walk 10 feet on uneven surface  ?activity ? ? ?Assist   ? ? ?Assist level: Minimal Assistance - Patient > 75% ?Assistive device: Hand held assist  ? ?Wheelchair ? ? ? ? ?Assist Is the patient using a wheelchair?: No ?  ?  ? ?  ?   ? ? ?Wheelchair 50 feet with 2 turns activity ? ? ? ?Assist ? ?  ?  ? ? ?   ? ?Wheelchair 150 feet activity  ? ? ? ?Assist ?    ? ? ?   ? ?Blood pressure 105/67, pulse 81, temperature 98.2 ?F (36.8 ?C), resp. rate 15, height '5\' 2"'  (1.575 m), weight 54.8 kg, SpO2 96 %. ? ?Medical Problem List and Plan: ?1. 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            -patient may  shower ?            -ELOS/Goals: 7d, Mod I/Sup goals for ADL and Mobility  ?Team conference today please see physician documentation under team conference tab, met with team  to discuss problems,progress, and goals. Formulized individual treatment plan based on medical history, underlying problem and comorbidities. ? ?2.  Antithrombotics: ?-DVT/anticoagulation:  Pharmaceutical: Lovenox ?            -antiplatelet therapy: N/A due to bleed ?3. Pain Management: tylenol prn.  ?4. Mood: LCSW to follow for evaluation and support.  ?            -antipsychotic agents: N/A ?5. Neuropsych: This patient is capable of making decisions on her own behalf. ?No apparent cognitive deficits ?6. Skin/Wound Care: Routine pressure relief measures.  ?7. Fluids/Electrolytes/Nutrition: Monitor I/O. BMET nl  ?8. HTN: Monitor BP TID. Stable on home dose meds ?--Continue HCTZ, Amlodipine and Benazepril  ?  ?    ?Vitals:  ?  01/27/22 1945 01/28/22 0430  ?BP: 116/68 116/74  ?Pulse: 98 77  ?Resp: 18 18  ?Temp: 98.4 ?F (36.9 ?C) 97.8 ?F (36.6 ?C)  ?SpO2: 99% 98%  ?  ?9. Hyper lipidemia: On Statin (but does not plan on continuing it at discharge) ?            --continue to educate on compliance.  ?10. Leucocytosis: Monitor for fevers and other signs of infection ?11. Vestibular symptoms: change compazine ot zofran to reduce sedation  ?--Has an episode last night that woke her from sleep/needed meds ?            --vestibular eval and treat.  ? 12.  DIzziness not clearly central vestibular, BMET nl ,   check ortho vitals x 1  ?  ? ?LOS: ?2 days ?A FACE TO FACE EVALUATION WAS PERFORMED ? ?ALuanna SalkKirsteins ?01/29/2022, 9:22 AM  ? ? ? ?

## 2022-01-29 NOTE — Progress Notes (Signed)
Physical Therapy Session Note  Patient Details  Name: Molly Patterson MRN: [id number] Date of Birth: 11-Jan-1932  Today's Date: 01/30/2022 PT Individual Time: 0835-0928   PT Individual Time Calculation (min): 53 min   Short Term Goals: Week 1:  PT Short Term Goal 1 (Week 1): = to LTGs based on ELOS  Skilled Therapeutic Interventions/Progress Updates:   Pt received sitting in w/c and agreeable to therapy session despite reporting "not having a good morning." Pt states she was feeling "nauseous and dizzy" this morning requiring IV medications for her symptoms and now they have made her so tired she "can hardly keep my eyes open" and states she "feels like a drunk."  Transported to/from gym in w/c for time management and energy conservation.  Gait training ~148f using R HHA with light min assist for balance due to increased postural sway towards R primarily - continues to have decreased gait speed with reciprocal stepping patterns - and pauses when turning due to feeling imbalanced.  Dynamic gait training using agility ladder including: - forward reciprocal stepping with pt often having to perform step-to and have L lateral LOB today which is unusual - side stepping with pt having increased step lengths sideways compared to forward  - 2 feet in/2 feet out forwards stepping with min assist for balance as pt continues to have minor L lateral lean Requires min assist for balance throughout and noticed some R ankle instability especially when stepping feet back towards each other as pt tends to step on her L foot with her R causing slight R ankle inversion rolling - cuing for improvement but pt with poor awareness of it.  Pt reporting significant fatigue and requesting to participate in seated exercises.  Seated EOM requiring trunk control while performing R UE clothespin pinch strength exercise of placing black clothespins on thickest line x10reps - requires use of both hands to open this level  of resistance clothespin.  Participated in 9-hole PEG test to assess UE coordination.  - test trial required 43seconds with R hand - R hand: 30 seconds - L hand: 33 seconds  MD in/out for morning assessment.  Transported back to room in w/c and pt requesting to return to bed and rest. Stand pivot to EOB with CGA. Sit>supine with supervision. Pt left supine in bed with needs in reach and bed alarm on.   Therapy Documentation Precautions:  Precautions Precautions: Fall Precaution Comments: nausea/dizziness/lightheadedness, hard of hearing, motion sensitivity Restrictions Weight Bearing Restrictions: No  Pain:  No complaints of pain throughout session.      Therapy/Group: Individual Therapy  CTawana Scale, PT, DPT, NCS, CSRS 01/29/2022, 7:54 AM

## 2022-01-29 NOTE — Progress Notes (Signed)
Occupational Therapy Session Note ? ?Patient Details  ?Name: Molly Patterson ?MRN: [id number] ?Date of Birth: Oct 19, 1931 ? ?Today's Date: 01/29/2022 ?OT Individual Time: 0973-5329 ?OT Individual Time Calculation (min): 57 min  ? ? ?Short Term Goals: ?Week 1:  OT Short Term Goal 1 (Week 1): Patient will dress herself with no more than set up assistance ?OT Short Term Goal 2 (Week 1): Patient wil shower with min assistance ?OT Short Term Goal 3 (Week 1): Patient will ambulate to the bathroom and complete all toileting with intermittent min assistance ? ?Skilled Therapeutic Interventions/Progress Updates:  ?  Pt resting in bed upon arrival this morning. Pt declined shower, stating she just doesn't think she can this morning. Pt agreeable to bathing/dressing with sit<>stand from w/c at sink. Pt reports dizziness with all transitional movements and especially when standing. Pt completed all bathing/dressing tasks with sit<>stand from w/c at sink with CGA for standing segments of BADLs. Pt requested to use toilet but declined walking with RW for same reasons. Toilet tranfser and toileting with CGA. Pt remained in w/c with all needs within reach and seat alarm activated.  ? ?Therapy Documentation ?Precautions:  ?Precautions ?Precautions: Fall ?Precaution Comments: nausea/dizziness/lightheadedness, hard of hearing, motion sensitivity ?Restrictions ?Weight Bearing Restrictions: No ?Pain: ? Pt denies pain this morning ? ? ?Therapy/Group: Individual Therapy ? ?Leroy Libman ?01/29/2022, 8:03 AM ?

## 2022-01-29 NOTE — Progress Notes (Signed)
Occupational Therapy Session Note ? ?Patient Details  ?Name: Molly Patterson ?MRN: [id number] ?Date of Birth: 1932/09/12 ? ?Today's Date: 01/29/2022 ?OT Individual Time: 1301-1400 ?OT Individual Time Calculation (min): 59 min  ? ? ?Short Term Goals: ?Week 1:  OT Short Term Goal 1 (Week 1): Patient will dress herself with no more than set up assistance ?OT Short Term Goal 2 (Week 1): Patient wil shower with min assistance ?OT Short Term Goal 3 (Week 1): Patient will ambulate to the bathroom and complete all toileting with intermittent min assistance ? ?Skilled Therapeutic Interventions/Progress Updates:  ?  Pt received semi-reclined in bed with family present, reports feeling much better than this am, agreeable to therapy. Session focus on self-care retraining, activity tolerance, dynamic standing balance, visual scanning, transfer retraining in prep for improved ADL/IADL/func mobility performance + decreased caregiver burden. Close S for supine > sitting EOB. Ambulated to and from toilet with RW and close S, distant S for seated toileting tasks post continent void of b/b. Total A w/c transport to and from gym/hospital [facility]rium. ? ?Pt reports 4/10 dizziness initially in sessions, reports that lying down/or initially standing up is when she notices it the most. Pt practice shaking her head up/side to side with no exacerbation in symptoms.  ? ?Ambulated in busy hospital atrium environment/gift shop and able to locate 5/5 shopping list items with min cues overall to locate items on visually busy shelves. Reports 0/10 dizziness with activity. ? ?Finally, participated in one round of corn hole and able to collect bean bags with use of reacher, again no reported increase in dizziness and close S to CGA for balance throughout when using BUE on reacher. ? ?Pt left semi-reclined in bed with NT taking vitals with call bell in reach, and all immediate needs met.  ? ? ?Therapy Documentation ?Precautions:   ?Precautions ?Precautions: Fall ?Precaution Comments: nausea/dizziness/lightheadedness, hard of hearing, motion sensitivity ?Restrictions ?Weight Bearing Restrictions: No ? ?Pain: no c/o throughout ?  ?ADL: See Care Tool for more details. ? ?Therapy/Group: Individual Therapy ? ?Volanda Napoleon MS, OTR/L ? ?01/29/2022, 6:53 AM ?

## 2022-01-30 DIAGNOSIS — I614 Nontraumatic intracerebral hemorrhage in cerebellum: Secondary | ICD-10-CM | POA: Diagnosis not present

## 2022-01-30 NOTE — Progress Notes (Signed)
Occupational Therapy Session Note  Patient Details  Name: KRITHIKA TOME MRN: [id number] Date of Birth: [birth year]  Today's Date: 01/30/2022 OT Individual Time: 1898-4210 OT Individual Time Calculation (min): 55 min    Short Term Goals: Week 1:  OT Short Term Goal 1 (Week 1): Patient will dress herself with no more than set up assistance OT Short Term Goal 2 (Week 1): Patient wil shower with min assistance OT Short Term Goal 3 (Week 1): Patient will ambulate to the bathroom and complete all toileting with intermittent min assistance  Skilled Therapeutic Interventions/Progress Updates:    Patient received supine in bed sleeping.  Patient easily woke,and agreeable to therapy although declines shower.  Patient reports feeling very "out of it" yesterday after taking compazine.  Reports she has new nausea medication less sedating.  She hopes to take medication with meals to address nausea.   Patient ambulated in room with walker and contact guard.  Patient much less nauseated/dizzy this session.  Able to was, dress, and complete hygiene at sink - standing at one point for 2 min without difficulty or increasing symptoms.   Encouraged patient to call for nausea medications as needed during the day to prevent motion activated nausea.  Patient feels she may be ready to shower tomorrow.    Therapy Documentation Precautions:  Precautions Precautions: Fall Precaution Comments: nausea/dizziness/lightheadedness, hard of hearing, motion sensitivity Restrictions Weight Bearing Restrictions: No   Pain:  No pain      Therapy/Group: Individual Therapy  Mariah Milling 01/30/2022, 12:23 PM

## 2022-01-30 NOTE — Progress Notes (Signed)
Patient ID: Molly Patterson, female   DOB: [birth year], 88 y.o.   MRN: [id number]  Team Conference Report to Patient/Family  Team Conference discussion was reviewed with the patient and caregiver, including goals, any changes in plan of care and target discharge date.  Patient and caregiver express understanding and are in agreement.  The patient has a target discharge date of 02/04/22.  Sw met with patient, spouse and son and provided conference updates. Patient making great progress and has a potential discharge date of 5/23. Patient daughter arranged family education on this upcoming Saturday 1-4 PM. She reports she feels comfortable caring for patient due to family caring for a family member for many years.   Dyanne Iha 01/30/2022, 1:00 PM

## 2022-01-30 NOTE — IPOC Note (Signed)
Overall Plan of Care [facility]) Patient Details Name: Molly Patterson MRN: [id number] DOB: 11-16-1931  Admitting Diagnosis: Cerebellar bleed [facility])  Hospital Problems: Principal Problem:   Cerebellar bleed ([place])     Functional Problem List: Nursing Medication Management, Safety, Endurance  PT Balance, Safety, Behavior, Sensory, Edema, Skin Integrity, Endurance, Motor, Nutrition, Pain, Perception  OT Balance, Endurance, Motor, Safety  SLP    TR         Basic ADL's: OT Bathing, Dressing, Toileting     Advanced  ADL's: OT Simple Meal Preparation, Laundry, Light Housekeeping     Transfers: PT Bed Mobility, Bed to Chair, Car, Furniture, Floor  OT Toilet, Tub/Shower     Locomotion: PT Ambulation, Stairs     Additional Impairments: OT None  SLP        TR      Anticipated Outcomes Item Anticipated Outcome  Self Feeding independent  Swallowing      Basic self-care  Modified independent  Toileting  Modified Independent   Bathroom Transfers Modified Independent  Bowel/Bladder  n/a  Transfers  mod-I using LRAD  Locomotion  supervision  Communication     Cognition     Pain  n/a  Safety/Judgment  maintain safety w cues   Therapy Plan: PT Intensity: Minimum of 1-2 x/day ,45 to 90 minutes PT Frequency: 5 out of 7 days PT Duration Estimated Length of Stay: 7-10 days OT Intensity: Minimum of 1-2 x/day, 45 to 90 minutes OT Frequency: 5 out of 7 days OT Duration/Estimated Length of Stay: 7-10 days     Team Interventions: Nursing Interventions Patient/Family Education, Medication Management, Disease Management/Prevention, Discharge Planning  PT interventions Ambulation/gait training, Community reintegration, DME/adaptive equipment instruction, Neuromuscular re-education, Psychosocial support, Stair training, UE/LE Strength taining/ROM, Training and development officer, Discharge planning, Pain management, Functional electrical stimulation, Skin care/wound management,  Therapeutic Activities, UE/LE Coordination activities, Cognitive remediation/compensation, Disease management/prevention, Functional mobility training, Patient/family education, Splinting/orthotics, Therapeutic Exercise, Visual/perceptual remediation/compensation  OT Interventions Balance/vestibular training, Neuromuscular re-education, Self Care/advanced ADL retraining, Therapeutic Exercise, DME/adaptive equipment instruction, Pain management, UE/LE Strength taining/ROM, Patient/family education, Functional mobility training, Therapeutic Activities  SLP Interventions    TR Interventions    SW/CM Interventions Discharge Planning, Psychosocial Support, Patient/Family Education, Disease Management/Prevention   Barriers to Discharge MD   Balance and safety  Nursing Decreased caregiver support, Home environment access/layout multi level 7 ste bil rails with spouse  PT Home environment access/layout    OT Other (comments) Motion sensitivity  SLP      SW       Team Discharge Planning: Destination: PT-Home ,OT- Home , SLP-  Projected Follow-up: PT-Outpatient PT, 24 hour supervision/assistance, OT-  Home health OT, SLP-  Projected Equipment Needs: PT-To be determined, OT- To be determined, SLP-  Equipment Details: PT- , OT-Patient has never needed equipment - anticipate rolling walker, and shower seat to be determined, possibly BSC Patient/family involved in discharge planning: PT- Patient,  OT-Patient, SLP-   MD ELOS: 7 days Medical Rehab Prognosis:  Excellent Assessment: The patient has been admitted for CIR therapies with the diagnosis of cerebellar hemorrhage. The team will be addressing functional mobility, strength, stamina, balance, safety, adaptive techniques and equipment, self-care, bowel and bladder mgt, patient and caregiver education, blood pressure management, bowel management. Goals have been set at modified independent. Anticipated discharge destination is home.        See  Team Conference Notes for weekly updates to the plan of care

## 2022-01-30 NOTE — Progress Notes (Signed)
PROGRESS NOTE   Subjective/Complaints:  No issues overnite, nausea controlled with Zofran ,no sedating side effects   ROS- Neg CP, SOB, +nausea   Objective:   No results found. Recent Labs    01/28/22 0541  WBC 9.7  HGB 13.4  HCT 41.1  PLT 236    Recent Labs    01/28/22 0541  NA 139  K 3.7  CL 103  CO2 30  GLUCOSE 94  BUN 14  CREATININE 0.69  CALCIUM 9.2     Intake/Output Summary (Last 24 hours) at 01/30/2022 0824 Last data filed at 01/30/2022 0803 Gross per 24 hour  Intake 478 ml  Output --  Net 478 ml         Physical Exam: Vital Signs Blood pressure 112/67, pulse 68, temperature (!) 97.5 F (36.4 C), temperature source Oral, resp. rate 16, height '5\' 2"'$  (1.575 m), weight 54.8 kg, SpO2 96 %.   General: No acute distress Mood and affect are appropriate Heart: Regular rate and rhythm no rubs murmurs or extra sounds Lungs: Clear to auscultation, breathing unlabored, no rales or wheezes Abdomen: Positive bowel sounds, soft nontender to palpation, nondistended Extremities: No clubbing, cyanosis, or edema Skin: No evidence of breakdown, no evidence of rash Neurologic: Cranial nerves II through XII intact, motor strength is 5/5 in bilateral deltoid, bicep, tricep, grip, hip flexor, knee extensors, ankle dorsiflexor and plantar flexor Sensory exam normal sensation to light touch and proprioception in bilateral upper and lower extremities Cerebellar exam normal finger to nose to finger as well as heel to shin in bilateral upper and lower extremities Musculoskeletal: Full range of motion in all 4 extremities. No joint swelling   Assessment/Plan: 1. Functional deficits which require 3+ hours per day of interdisciplinary therapy in a comprehensive inpatient rehab setting. Physiatrist is providing close team supervision and 24 hour management of active medical problems listed below. Physiatrist and rehab  team continue to assess barriers to discharge/monitor patient progress toward functional and medical goals  Care Tool:  Bathing    Body parts bathed by patient: Right arm, Left arm, Chest, Abdomen, Front perineal area, Buttocks, Right upper leg, Left upper leg, Right lower leg, Left lower leg, Face         Bathing assist Assist Level: Contact Guard/Touching assist     Upper Body Dressing/Undressing Upper body dressing   What is the patient wearing?: Pull over shirt, Bra    Upper body assist Assist Level: Supervision/Verbal cueing    Lower Body Dressing/Undressing Lower body dressing      What is the patient wearing?: Pants, Underwear/pull up     Lower body assist Assist for lower body dressing: Contact Guard/Touching assist     Toileting Toileting    Toileting assist Assist for toileting: Contact Guard/Touching assist     Transfers Chair/bed transfer  Transfers assist     Chair/bed transfer assist level: Minimal Assistance - Patient > 75%     Locomotion Ambulation   Ambulation assist      Assist level: Minimal Assistance - Patient > 75% Assistive device: Hand held assist Max distance: 11f   Walk 10 feet activity   Assist  Assist level: Minimal Assistance - Patient > 75% Assistive device: Hand held assist   Walk 50 feet activity   Assist    Assist level: Minimal Assistance - Patient > 75% Assistive device: Hand held assist    Walk 150 feet activity   Assist    Assist level: Minimal Assistance - Patient > 75% Assistive device: Hand held assist    Walk 10 feet on uneven surface  activity   Assist     Assist level: Minimal Assistance - Patient > 75% Assistive device: Hand held assist   Wheelchair     Assist Is the patient using a wheelchair?: No             Wheelchair 50 feet with 2 turns activity    Assist            Wheelchair 150 feet activity     Assist          Blood pressure 112/67,  pulse 68, temperature (!) 97.5 F (36.4 C), temperature source Oral, resp. rate 16, height '5\' 2"'$  (1.575 m), weight 54.8 kg, SpO2 96 %.  Medical Problem List and Plan: 1. 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  Antithrombotics: -DVT/anticoagulation:  Pharmaceutical: Lovenox             -antiplatelet therapy: N/A due to bleed 3. Pain Management: tylenol prn.  4. Mood: LCSW to follow for evaluation and support.              -antipsychotic agents: N/A 5. Neuropsych: This patient is capable of making decisions on her own behalf. No apparent cognitive deficits 6. Skin/Wound Care: Routine pressure relief measures.  7. Fluids/Electrolytes/Nutrition: Monitor I/O. BMET nl  8. HTN: Monitor BP TID. Stable on home dose meds --Continue HCTZ, Amlodipine and Benazepril     BP controlled 5/18 9. Hyper lipidemia: On Statin (but does not plan on continuing it at discharge)             --continue to educate on compliance.  10. Leucocytosis: Monitor for fevers and other signs of infection 11. Vestibular symptoms: changed compazine ot zofran to reduce sedation  --Has an episode last night that woke her from sleep/needed meds             --vestibular eval and treat.   12.  DIzziness not clearly central vestibular, BMET nl ,   check ortho vitals x 1     LOS: 3 days A FACE TO FACE EVALUATION WAS PERFORMED  Charlett Blake 01/30/2022, 8:24 AM

## 2022-01-30 NOTE — Progress Notes (Signed)
Physical Therapy Session Note  Patient Details  Name: Molly Patterson MRN: [id number] Date of Birth: 12-Nov-1931  Today's Date: 01/30/2022 PT Individual Time: 0920-1031 and 1310-1410 PT Individual Time Calculation (min): 71 min and 60 min  Short Term Goals: Week 1:  PT Short Term Goal 1 (Week 1): = to LTGs based on ELOS  Skilled Therapeutic Interventions/Progress Updates:    Session 1: Pt received sitting in w/c and agreeable to therapy session. Pt reports her "eyes are feeling funny" with difficulty describing what that means but does say they are NOT blurry - pt wears her prescription glasses, which she said she is to wear them as needed to "rest" her eyes per her eye doctor. Later in session, pt reports her eyes feel better.  Sit<>stands with R HHA with CGA for safety during session. Gait training ~253f towards therapy gym, no AD but using R HHA, with CGA and intermittent light min assist for balance due to varying R/L lateral sway/minor LOB especially when turning.  Dynamic gait training ~2079fx2 while performing tasks focusing on head turns, sudden start/stops, turning around, and lateral movement including: - collecting post-it notes 1-10 working on head turns and finding visual targets - R/L head turns on verbal command - tends to have more consistent L lateral LOB when turning head L but will also have R LOB - backwards walking with pt having very poor control over her momentum in this direction requiring consistent min assist to maintain upright, preventing posterior LOB - side stepping with pt having more balance instability towards L compared to R - continues to slow down and pause when turning due to feeling unstable  Stair navigation training ascending/descending 4 (6" height) steps x3 using B HRs as needed with CGA for safety and pt self-selecting reciprocal pattern with only slight instability.   Dynamic balance task with dual-task challenge of stepping over hockey sticks  while picking up clothespins from low surface and placing them on basketball goal net on either side of the hockey sticks to challenge turning and head turns - CGA for steadying with only inconsistent slight instability noted otherwise good balance throughout.  Gait training ~20067fack to room with CGA and intermittent light min assist for balance while continuing to participate in visual scanning and head turns. Pt left seated in w/c with needs in reach and chair alarm on.   Session 2: Pt received sitting in w/c with her family present and exiting upon therapist arrival. Pt agreeable to therapy session reporting need to use bathroom. Sit<>stands throughout session, no AD, with CGA for steadying. Gait in/out bathroom, no AD, but pt using UE support on sink, door frame, and grab bar for stability with CGA for safety. Standing with CGA/close supervision performed LB clothing management without assist - continent of bladder and bowels - seated peri-care without assist. Standing hand hygiene at sink with supervision. Notified the nurse and transported pt outside in w/c.  Gait training outside >200f50f, no UE support, with CGA and intermittent min assist due to R/L lateral LOB that occurs especially when turning or rotating head  - including navigating up/down ~8 outdoor steps x2 with only R UE support on each HR - during 2nd set (after seated rest break) pt spontaneously had sudden onset of severe "woozy" and "nauseous" feeling causing strong R lateral lean/LOB and pt leaning against handrail for support with min assist provided for safety - this only lasted <10 seconds and pt reported resolution of her symptoms -  navigating up/down ramp and ambulating over brick pathway with continued slow, cautious gait speed and pt limiting head movements  Transported back up to CIR in w/c.  Dynamic standing balance task of tossing ball into rebounder targeting ankle balance recovery strategy with CGA for steadying -  when pt dropped the ball she said she couldn't bend down and pick it up because of onset of nausea with this movement therefore therapist retrieved ball.  Transported back to room. R stand pivot to EOB, no AD, with CGA. Once sitting EOB pt had sudden onset of nausea with emesis - therapist provided pt with trash can and cool washcloth then notified nursing staff.   After symptoms resolved, pt transitioned into supine and left resting in bed with needs in reach and bed alarm on.    Therapy Documentation Precautions:  Precautions Precautions: Fall Precaution Comments: nausea/dizziness/lightheadedness, hard of hearing, motion sensitivity Restrictions Weight Bearing Restrictions: No   Pain:  Session 1: Reports she has her chronic OA pain in B hips - pt reports no intervention needed during session.  Session 2: No complaints of pain during session.    Therapy/Group: Individual Therapy  Tawana Scale , PT, DPT, NCS, CSRS 01/30/2022, 8:02 AM

## 2022-01-30 NOTE — Progress Notes (Signed)
Recreational Therapy Assessment and Plan  Patient Details  Name: Molly Patterson MRN: [id number] Date of Birth: 29-Jun-1932 Today's Date: 01/30/2022  Rehab Potential:  Good ELOS:   d/c  5/23  Assessment  Hospital Problem: Principal Problem:   Cerebellar bleed [facility])     Past Medical History:      Past Medical History:  Diagnosis Date   Allergy      hayfever   ANEMIA-IRON DEFICIENCY 08/02/2009   Basal cell carcinoma (BCC) of right forearm     BURSITIS, HIP 08/02/2009   COVID-19     HYPERTENSION 08/02/2009   OSTEOARTHRITIS, GENERALIZED 08/12/2010   Raynaud's syndrome 08/12/2010   Transfusion history 09/15/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 year old female with history of OA, HTN, Raynauds, vertigo, who was admitted with 10 day history of severe dizziness with N/V. She was seen in ED  4/30 and 5/5 at [facility] with negative CT.  She presented to [facility] on 01/20/22 with ongoing dizziness, nausea and inability to eat. She reported having a ICH about 30 years ago? (Dr. Hal Neer) that did not require any interventions. 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   CTA head/neck was  negative for LVO and showed severe right P2 stenosis. Dr. Eugenia Pancoast was  consulted for input and recommended observation --no surgical needs.  MRI brain revealed stable acute hemorrhage involving right cerebellun without lesion on non-contrast exam. 2D echo showed EF 60-65% with trivial AVR and MVR. Neurology felt that stroke was due to accelerated HTN and she was started on Clevidipine and transitioned to home dose HCTZ with good BP control. She continued to be limited by unsteady gait with intermittent dizziness affecting functional status. CIR recommended due to functional decline.   Patient transferred to CIR on 01/27/2022.     Pt presents with decreased activity tolerance, decreased functional mobility, decreased balance Limiting pt's independence with leisure/community pursuits.  Met with pt today to discuss TR services including leisure education, activity analysis/modifications and stress management.  Also discussed the importance of social, emotional, spiritual health in addition to physical health and their effects on overall health and wellness.  Pt stated understanding, is extremely motivated to returning home and to previously enjoyed activities.   Plan  No further TR at this time  Recommendations for other services: None   Discharge Criteria: Patient will be discharged from TR if patient refuses treatment 3 consecutive times without medical reason.  If treatment goals not met, if there is a change in medical status, if patient makes no progress towards goals or if patient is discharged from hospital.  The above assessment, treatment plan, treatment alternatives and goals were discussed and mutually agreed upon: by patient  [place] 01/30/2022, 2:47 PM

## 2022-01-31 DIAGNOSIS — I614 Nontraumatic intracerebral hemorrhage in cerebellum: Secondary | ICD-10-CM | POA: Diagnosis not present

## 2022-01-31 MED ORDER — ONDANSETRON 4 MG PO TBDP
4.0000 mg | ORAL_TABLET | Freq: Three times a day (TID) | ORAL | Status: DC
  Administered 2022-01-31 – 2022-02-04 (×12): 4 mg via ORAL
  Filled 2022-01-31 (×12): qty 1

## 2022-01-31 NOTE — Progress Notes (Signed)
Physical Therapy Session Note  Patient Details  Name: Molly Patterson MRN: [id number] Date of Birth: 04-10-1932  Today's Date: 01/31/2022 PT Individual Time: 9381-0175 PT Individual Time Calculation (min): 41 min   Short Term Goals: Week 1:  PT Short Term Goal 1 (Week 1): = to LTGs based on ELOS  Skilled Therapeutic Interventions/Progress Updates:    Patient received sitting up in recliner, agreeable to PT. She denies pain, but endorses slight nausea. Patient requesting to use the bathroom. Ambulatory transfer to toilet with RW and light CGA. Continent void and bowel movement. Supervision for perihygiene and clothing management in standing. Patient ambulating ot sink for hand hygiene with RW and CGA with LOB x1 when turning head R. Able to recover without further intervention from PT. Patient ambulating to dayroom with RW and CGA. Standing on solid ground ball chest press + shoulder press, progressing to standing on foam chest press + shoulder press with slight posterior tendency, but no overt LOB. Appropriate ankle strategy observed. Patient weaving through cones with RW and without RW. Slight LOB when turning around terminal cone, but able to compensate by turning more slowly at next turn. Patient completing U LE cone tap while ambulating with no AD and CGA. Verbal cues for increased engagement of standing leg for improved balance. Patient ambulating back to her room with RW and CGA. Electing to return to bed. Bed alarm on, call light within reach.   Therapy Documentation Precautions:  Precautions Precautions: Fall Precaution Comments: nausea/dizziness/lightheadedness, hard of hearing, motion sensitivity Restrictions Weight Bearing Restrictions: No     Therapy/Group: Individual Therapy  Karoline Caldwell, PT, DPT, CBIS  01/31/2022, 7:52 AM

## 2022-01-31 NOTE — Progress Notes (Signed)
Physical Therapy Session Note  Patient Details  Name: Molly Patterson MRN: [id number] Date of Birth: [birth year]  Today's Date: 01/31/2022 PT Individual Time: 0800-0858 PT Individual Time Calculation (min): 58 min   Short Term Goals: Week 1:  PT Short Term Goal 1 (Week 1): = to LTGs based on ELOS  Skilled Therapeutic Interventions/Progress Updates:     Pt seen lying in bed to start with RN at bedside - confirms nausea and dizziness Rx this morning as well as orthostatics assessed which were WNL. Pt denies pain.   Bed mobility completed without assist or cues, HOB slightly elevated. Donned tennis shoes with totalA for time management. Sit<>stand to RW with close supervision. Ambulated to bathroom with CGA and RW with slight unsteadiness while managing bathroom threshold. She's able to complete 3/3 toileting tasks without assist, continent  of bladder (charted in flowsheets). Hand hygiene at sink with supervision for balance.   Ambulated to main rehab gym, ~112f, with close supervision and RW. Unsteadiness with turns although she is more careful with these. MD entering for morning rounds - dicussed nausea/dizziness. Instructed in 6MWT. She covered 7681fusing her RW and close supervision. Age norm = 1,28656fGait speed 0.74m90mpulled from 6MWT), indicative of limited community ambulator and increased falls risk.   Ambulated to day room rehab gym with supervision and RW, ~150ft25ftupA on Nustep and she completed a total of 8 minutes using BUE/BLE, targeting coordination, cardiovascular endurance, and strengthening. Resistance set to 5.   TUG completed in 20s using RW and close supervision. Increased time during turns and for sitting safely to chair. *Scores >13.5 seconds indicate increased falls risk.   Ambulated back to her room with close supervision and RW ,~150ft.32fcluded session seated in recliner with chair alarm on, call bell within reach, all needs met.   Therapy  Documentation Precautions:  Precautions Precautions: Fall Precaution Comments: nausea/dizziness/lightheadedness, hard of hearing, motion sensitivity Restrictions Weight Bearing Restrictions: No General:     Therapy/Group: Individual Therapy  ChristAlger Simons2023, 7:46 AM

## 2022-01-31 NOTE — Progress Notes (Signed)
Occupational Therapy Session Note  Patient Details  Name: Molly Patterson MRN: [id number] Date of Birth: 1932/04/19  Today's Date: 01/31/2022 OT Individual Time: 1300-1415 OT Individual Time Calculation (min): 75 min    Short Term Goals: Week 1:  OT Short Term Goal 1 (Week 1): Patient will dress herself with no more than set up assistance OT Short Term Goal 2 (Week 1): Patient wil shower with min assistance OT Short Term Goal 3 (Week 1): Patient will ambulate to the bathroom and complete all toileting with intermittent min assistance  Skilled Therapeutic Interventions/Progress Updates:  Pt greeted seated EOB  agreeable to OT intervention. Pt completed stand pivot transfer from EOB >wc with Rw and supervision. Pt transported to gym with total A for time mgmt.                     Pt completed various dynamic balance tasks as indicated below: 1 min of marching onto 3 inch step with BUE support on RW 1 min of reciprocal marching with no UE support with MIN A Dynamic Standing balance training on beep board with one UE supported on RW to play matching game with CGA ( no LOB), pt needed MOD verbal cues to play matching game correctly as pt wanting to flip over multiple cards at a time. Collecting items in hallway on R and L side with Rw and CGA to simulate IADL task of picking up IADL items at home Standing Dynamic reaching with BUEs to toss horseshoes; graded task up and had pt stand on airex cushion to complete dynamic reaching with MIN A- CGA  Pt also worked on functional ambulation in apt setting with completing transfer to flat bed, recliner, couch and TTB with rw and supervision, pts daughter already has her a TTB for home and pt declined RW bag as also has one at home. Pt ambulated back to room with rw and supervision. Pt left seated in w/c with all needs within reach.   Therapy Documentation Precautions:  Precautions Precautions: Fall Precaution Comments:  nausea/dizziness/lightheadedness, hard of hearing, motion sensitivity Restrictions Weight Bearing Restrictions: No   Pain: No pain reported during session    Therapy/Group: Individual Therapy  [name] 01/31/2022, 3:53 PM

## 2022-01-31 NOTE — Progress Notes (Signed)
Occupational Therapy Session Note  Patient Details  Name: Molly Patterson MRN: [id number] Date of Birth: 02/24/1932  Today's Date: 01/31/2022 OT Individual Time: 0518-3358 OT Individual Time Calculation (min): 57 min    Short Term Goals: Week 1:  OT Short Term Goal 1 (Week 1): Patient will dress herself with no more than set up assistance OT Short Term Goal 2 (Week 1): Patient wil shower with min assistance OT Short Term Goal 3 (Week 1): Patient will ambulate to the bathroom and complete all toileting with intermittent min assistance  Skilled Therapeutic Interventions/Progress Updates:    Pt greeted semi-reclined in bed and agreeable to OT treatment session. Pt agreeable to shower today. Pt completed bed mobility with supervision. Some nausea when getting up, but able to work through it. Pt needed verbal cues for hand placemnent w/ RW when coming to standing w/ supervision. Supervision to ambulate to bathroom and transfer onto commode using RW. Pt voided bladder and completed 3/3 toileting steps with supervision. Pt doffed clothing seated on tub bench, then performed bathing with supervision. Dressing completed sit<>stand from bench with increased time, but supervision. Pt then ambulated out of bathroom and took seated rest break. Pt ambulated to dayroom with RW and supervision. Addressed standing balance standing on foam block while alternating UE and reaching outside base of support to place matching card. Supervision for balance. Pt returned to room and left semi-reclined in bed with bed alarm on, family present, and needs met.   Therapy Documentation Precautions:  Precautions Precautions: Fall Precaution Comments: nausea/dizziness/lightheadedness, hard of hearing, motion sensitivity Restrictions Weight Bearing Restrictions: No Pain: Pain Assessment Pain Scale: 0-10 Pain Score: 0-No pain    Therapy/Group: Individual Therapy  Valma Cava 01/31/2022, 11:56 AM

## 2022-01-31 NOTE — Progress Notes (Signed)
PROGRESS NOTE   Subjective/Complaints:  Nausea and vomiting last noc, relieved with zofran    ROS- Neg CP, SOB, +nausea   Objective:   No results found. No results for input(s): WBC, HGB, HCT, PLT in the last 72 hours.  No results for input(s): NA, K, CL, CO2, GLUCOSE, BUN, CREATININE, CALCIUM in the last 72 hours.   Intake/Output Summary (Last 24 hours) at 01/31/2022 0820 Last data filed at 01/30/2022 1854 Gross per 24 hour  Intake 120 ml  Output --  Net 120 ml         Physical Exam: Vital Signs Blood pressure 125/72, pulse 97, temperature 98 F (36.7 C), resp. rate 16, height '5\' 2"'$  (1.575 m), weight 54.8 kg, SpO2 96 %.   General: No acute distress Mood and affect are appropriate Heart: Regular rate and rhythm no rubs murmurs or extra sounds Lungs: Clear to auscultation, breathing unlabored, no rales or wheezes Abdomen: Positive bowel sounds, soft nontender to palpation, nondistended Extremities: No clubbing, cyanosis, or edema Skin: No evidence of breakdown, no evidence of rash Neurologic: Cranial nerves II through XII intact, motor strength is 5/5 in bilateral deltoid, bicep, tricep, grip, hip flexor, knee extensors, ankle dorsiflexor and plantar flexor Sensory exam normal sensation to light touch and proprioception in bilateral upper and lower extremities Cerebellar exam normal finger to nose to finger as well as heel to shin in bilateral upper and lower extremities Musculoskeletal: Full range of motion in all 4 extremities. No joint swelling   Assessment/Plan: 1. Functional deficits which require 3+ hours per day of interdisciplinary therapy in a comprehensive inpatient rehab setting. Physiatrist is providing close team supervision and 24 hour management of active medical problems listed below. Physiatrist and rehab team continue to assess barriers to discharge/monitor patient progress toward functional and  medical goals  Care Tool:  Bathing    Body parts bathed by patient: Right arm, Left arm, Chest, Abdomen, Front perineal area, Buttocks, Right upper leg, Left upper leg, Right lower leg, Left lower leg, Face         Bathing assist Assist Level: Supervision/Verbal cueing     Upper Body Dressing/Undressing Upper body dressing   What is the patient wearing?: Pull over shirt, Bra    Upper body assist Assist Level: Set up assist    Lower Body Dressing/Undressing Lower body dressing      What is the patient wearing?: Pants, Underwear/pull up     Lower body assist Assist for lower body dressing: Contact Guard/Touching assist     Toileting Toileting    Toileting assist Assist for toileting: Minimal Assistance - Patient > 75%     Transfers Chair/bed transfer  Transfers assist     Chair/bed transfer assist level: Contact Guard/Touching assist     Locomotion Ambulation   Ambulation assist      Assist level: Contact Guard/Touching assist Assistive device: Hand held assist Max distance: 121f   Walk 10 feet activity   Assist     Assist level: Minimal Assistance - Patient > 75% Assistive device: Hand held assist   Walk 50 feet activity   Assist    Assist level: Minimal Assistance - Patient >  75% Assistive device: Hand held assist    Walk 150 feet activity   Assist    Assist level: Minimal Assistance - Patient > 75% Assistive device: Hand held assist    Walk 10 feet on uneven surface  activity   Assist     Assist level: Minimal Assistance - Patient > 75% Assistive device: Hand held assist   Wheelchair     Assist Is the patient using a wheelchair?: No             Wheelchair 50 feet with 2 turns activity    Assist            Wheelchair 150 feet activity     Assist          Blood pressure 125/72, pulse 97, temperature 98 F (36.7 C), resp. rate 16, height '5\' 2"'$  (1.575 m), weight 54.8 kg, SpO2 96  %.  Medical Problem List and Plan: 1. 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  Antithrombotics: -DVT/anticoagulation:  Pharmaceutical: Lovenox             -antiplatelet therapy: N/A due to bleed 3. Pain Management: tylenol prn.  4. Mood: LCSW to follow for evaluation and support.              -antipsychotic agents: N/A 5. Neuropsych: This patient is capable of making decisions on her own behalf. No apparent cognitive deficits 6. Skin/Wound Care: Routine pressure relief measures.  7. Fluids/Electrolytes/Nutrition: Monitor I/O. BMET nl  8. HTN: Monitor BP TID. Stable on home dose meds --Continue HCTZ, Amlodipine and Benazepril    Vitals:   01/31/22 0758 01/31/22 0800  BP: 121/75 125/72  Pulse: 91 97  Resp:    Temp:    SpO2:    Orthostatic vitals normal this am - feel dizziness is CVA related , no orthostasis    BP controlled 5/18 9. Hyper lipidemia: On Statin (but does not plan on continuing it at discharge)             --continue to educate on compliance.  10. Leucocytosis: Monitor for fevers and other signs of infection 11. Vestibular symptoms: changed compazine ot zofran to reduce sedation - will schedule  --Has an episode last night that woke her from sleep/needed meds             --vestibular eval and treat.      LOS: 4 days A FACE TO FACE EVALUATION WAS PERFORMED  Charlett Blake 01/31/2022, 8:20 AM

## 2022-01-31 NOTE — Progress Notes (Signed)
Patient ID: Molly Patterson, female   DOB: November 20, 1931, 88 y.o.   MRN: [id number]  Follow up referral faxed to OP at [facility]

## 2022-02-01 DIAGNOSIS — I614 Nontraumatic intracerebral hemorrhage in cerebellum: Secondary | ICD-10-CM | POA: Diagnosis not present

## 2022-02-01 NOTE — Progress Notes (Signed)
PROGRESS NOTE   Subjective/Complaints: No new complaints this morning Family is visiting   ROS- Denies CP, SOB, nausea  Objective:   No results found. No results for input(s): WBC, HGB, HCT, PLT in the last 72 hours.  No results for input(s): NA, K, CL, CO2, GLUCOSE, BUN, CREATININE, CALCIUM in the last 72 hours.   Intake/Output Summary (Last 24 hours) at 02/01/2022 1514 Last data filed at 02/01/2022 1302 Gross per 24 hour  Intake 660 ml  Output --  Net 660 ml        Physical Exam: Vital Signs Blood pressure 117/66, pulse 70, temperature 97.7 F (36.5 C), temperature source Oral, resp. rate 16, height '5\' 2"'$  (1.575 m), weight 54.8 kg, SpO2 96 %.  Gen: no distress, normal appearing HEENT: oral mucosa pink and moist, NCAT Cardio: Reg rate Chest: normal effort, normal rate of breathing Abd: soft, non-distended Ext: no edema Psych: pleasant, normal affect Neurologic: Cranial nerves II through XII intact, motor strength is 5/5 in bilateral deltoid, bicep, tricep, grip, hip flexor, knee extensors, ankle dorsiflexor and plantar flexor Sensory exam normal sensation to light touch and proprioception in bilateral upper and lower extremities Cerebellar exam normal finger to nose to finger as well as heel to shin in bilateral upper and lower extremities Musculoskeletal: Full range of motion in all 4 extremities. No joint swelling   Assessment/Plan: 1. Functional deficits which require 3+ hours per day of interdisciplinary therapy in a comprehensive inpatient rehab setting. Physiatrist is providing close team supervision and 24 hour management of active medical problems listed below. Physiatrist and rehab team continue to assess barriers to discharge/monitor patient progress toward functional and medical goals  Care Tool:  Bathing    Body parts bathed by patient: Right arm, Left arm, Chest, Abdomen, Front perineal area,  Buttocks, Right upper leg, Left upper leg, Right lower leg, Left lower leg, Face         Bathing assist Assist Level: Supervision/Verbal cueing     Upper Body Dressing/Undressing Upper body dressing   What is the patient wearing?: Pull over shirt    Upper body assist Assist Level: Independent with assistive device    Lower Body Dressing/Undressing Lower body dressing      What is the patient wearing?: Pants, Underwear/pull up     Lower body assist Assist for lower body dressing: Supervision/Verbal cueing     Toileting Toileting    Toileting assist Assist for toileting: Supervision/Verbal cueing     Transfers Chair/bed transfer  Transfers assist     Chair/bed transfer assist level: Supervision/Verbal cueing     Locomotion Ambulation   Ambulation assist      Assist level: Supervision/Verbal cueing Assistive device: Walker-rolling Max distance: >14f   Walk 10 feet activity   Assist     Assist level: Supervision/Verbal cueing Assistive device: Walker-rolling   Walk 50 feet activity   Assist    Assist level: Supervision/Verbal cueing Assistive device: Walker-rolling    Walk 150 feet activity   Assist    Assist level: Supervision/Verbal cueing Assistive device: Walker-rolling    Walk 10 feet on uneven surface  activity   Assist     Assist  level: Supervision/Verbal cueing Assistive device: Aeronautical engineer Is the patient using a wheelchair?: No             Wheelchair 50 feet with 2 turns activity    Assist            Wheelchair 150 feet activity     Assist          Blood pressure 117/66, pulse 70, temperature 97.7 F (36.5 C), temperature source Oral, resp. rate 16, height '5\' 2"'$  (1.575 m), weight 54.8 kg, SpO2 96 %.  Medical Problem List and Plan: 1. 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  Impaired mobility, ambulating 275 feet: d/c Lovenox             -antiplatelet therapy: N/A due to bleed 3. Pain: continue tylenol prn.  4. Mood: LCSW to follow for evaluation and support.              -antipsychotic agents: N/A 5. Neuropsych: This patient is capable of making decisions on her own behalf. No apparent cognitive deficits 6. Skin/Wound Care: Routine pressure relief measures.  7. Fluids/Electrolytes/Nutrition: Monitor I/O. BMET nl  8. HTN: Monitor BP TID. Stable on home dose meds --continue HCTZ, Amlodipine and Benazepril    Vitals:   01/31/22 1939 02/01/22 0520  BP: 104/72 117/66  Pulse: 86 70  Resp: 17 16  Temp: 98.3 F (36.8 C) 97.7 F (36.5 C)  SpO2: 96% 96%  Orthostatic vitals normal this am - feel dizziness is CVA related , no orthostasis    BP controlled 5/20 9. Hyper lipidemia: On Statin (but does not plan on continuing it at discharge)             --continue to educate on compliance.  10. Leucocytosis: Monitor for fevers and other signs of infection 11. Vestibular symptoms: changed compazine ot zofran to reduce sedation - will schedule  --Has an episode last night that woke her from sleep/needed meds             --vestibular eval and treat.      LOS: 5 days A FACE TO FACE EVALUATION WAS PERFORMED  Clide Deutscher Tayvien Kane 02/01/2022, 3:14 PM

## 2022-02-01 NOTE — Progress Notes (Signed)
Occupational Therapy Session Note  Patient Details  Name: Molly Patterson MRN: [id number] Date of Birth: Sep 22, 1931  Today's Date: 02/01/2022 OT Missed Time: 50 Minutes Missed Time Reason: Other (comment) (breakfast)   Short Term Goals: Week 1:  OT Short Term Goal 1 (Week 1): Patient will dress herself with no more than set up assistance OT Short Term Goal 2 (Week 1): Patient wil shower with min assistance OT Short Term Goal 3 (Week 1): Patient will ambulate to the bathroom and complete all toileting with intermittent min assistance  Skilled Therapeutic Interventions/Progress Updates:    Pt received semi-reclined in bed, reports having just received breakfast. Politely requesting to finish breakfast prior to OOB activity with offers of assisting with ADL. Pt missed 30 min of scheduled OT - will reattempts as schedule allows.  Pt left semi-reclined in bed with bed alarm engaged, call bell in reach, and all immediate needs met.    Therapy Documentation Precautions:  Precautions Precautions: Fall Precaution Comments: nausea/dizziness/lightheadedness, hard of hearing, motion sensitivity Restrictions Weight Bearing Restrictions: No   Volanda Napoleon MS, OTR/L  02/01/2022, 6:58 AM

## 2022-02-01 NOTE — Progress Notes (Signed)
Occupational Therapy Session Note  Patient Details  Name: Molly Patterson MRN: [id number] Date of Birth: 27-Feb-1932  Today's Date: 02/01/2022 OT Individual Time: 1300-1400 OT Individual Time Calculation (min): 60 min    Short Term Goals: Week 1:  OT Short Term Goal 1 (Week 1): Patient will dress herself with no more than set up assistance OT Short Term Goal 2 (Week 1): Patient wil shower with min assistance OT Short Term Goal 3 (Week 1): Patient will ambulate to the bathroom and complete all toileting with intermittent min assistance  Skilled Therapeutic Interventions/Progress Updates:    Pt greeted semi-reclined in bed with family present for family education. OT educated on use of gait belt and safety with ambulation and RW. Pt ambulated to therapy apartment with RW and supervision. OT educated on use of tub bench for tub/shower transfers. Pt demonstrated understanding with supervision and min cues for hand placement to stand w/ RW. Educated on tucking in shower curtain and suggested grab bars and hand held shower hose-son reported they are installing these items including a raised commode. Pt ambulated back to room for shower. Bathing/dressing tasks competed from tub bench in shower with overall close supervision. Pt ambulated without AD and min A with intermittent scissoring of LE's and some lateral LOB's. Pt ambulated to therapy gym and worked on balance and weight shifting with standing toe taps on small cones. Pt needed facilitation for weight shiting at times and min A for balance. Pt ambulated back to room without AD and min A. Pt left seated EOB with family present awaiting next therapy session.   Therapy Documentation Precautions:  Precautions Precautions: Fall Precaution Comments: nausea/dizziness/lightheadedness, hard of hearing, motion sensitivity Restrictions Weight Bearing Restrictions: No Pain:  Denies pain, reports intermittent nausea, subsides with  rest.   Therapy/Group: Individual Therapy  Valma Cava 02/01/2022, 1:53 PM

## 2022-02-01 NOTE — Progress Notes (Signed)
Physical Therapy Session Note  Patient Details  Name: Molly Patterson MRN: [id number] Date of Birth: 1932-09-02  Today's Date: 02/01/2022 PT Individual Time: [phone] and 7510-2585 PT Individual Time Calculation (min): 41 min and 54 min  Short Term Goals: Week 1:  PT Short Term Goal 1 (Week 1): = to LTGs based on ELOS  Skilled Therapeutic Interventions/Progress Updates:   Treatment Session 1 Received pt ambulating out of bathroom with NT; PT took over with care. Pt agreeable to PT treatment and denied any pain during session. Session with emphasis on functional mobility/transfers, generalized strengthening and endurance, dynamic standing balance/coordination, NMR, and gait training. Pt performed all transfers with RW and supervision throughout session. Pt stood at sink and brushed teeth with supervision, then sat EOB and took medications, and donned shoes in figure four position with set up assist. Pt ambulated 158f x 2 trials with RW and close supervision to/from therapy gym. Pt performed alternating toe taps to 6in step 3x10 without UE support and min A for balance. Worked on blocked practice sit<>stands without UE support on Airex 2x10 reps with min A fading to CGA with emphasis on quad strength. Pt performed TUG without AD and min A with average of 16 seconds. Pt educated on test results and significance and importance of using RW upon D/C - pt in agreement.  Trial 1: 20 seconds Trial 2: 15 seconds Trial 3: 13 seconds Returned to room and requested to return to bed due to feeling slightly nauseous - provided pt with cool washcloth. Sit<>semi-reclined mod I. Concluded session with pt semi-reclined in bed, needs within reach, and bed alarm on.   Treatment Session 2 Received pt sitting EOB with family present for family education training. Pt agreeable to PT treatment and denied any pain during session. Session with emphasis on discharge planning, functional mobility/transfers, generalized  strengthening and endurance, dynamic standing balance/coordination, simulated car transfers, stair navigation, and gait training. Pt performed all transfers with RW and close supervision throughout session. Pt ambulated 1582fwith RW and CGA to main therapy gym. Discussed home entry and pt reports having 7 STE with bilateral handrails. Pt navigated 8 steps with 1 handrail and close supervision/CGA ascending and descending with a step through pattern - cues to use both handrails for improved safety and independence at home. Ambulated [id number] RW and supervision to ortho gym and pt performed ambulatory simulated car transfer with RW and supervision. Pt then ambulated [id number] uneven surfaces (ramp) with RW and close supervision. Pt able to stand and pick up small cup from floor using RW and CGA - educated pt on recommendation to use reacher or wait for someone else to pick up object from floor, rather than doing it herself due to balance deficits. Ambulated to rehab apartment and performed furniture transfer with RW on/off couch with supervision - cues to push up from couch rather than pulling up with RW. Pt performed bed mobility on regular flat bed mod I - per son, planning on getting adjustable bed delivered on Monday. Ambulated >150ft2fh RW and supervision to dayroom and performed BUE/LE strengthening on Nustep at workload 5 for 8 minutes for a total of 389 steps with emphasis on cardiovascular endurance. Worked on dynamic sitting balance and UE strength batting ball with 1lb dowel x20 reps and with 3lb dowel 2x20 reps. Pt ambulated 125ft 18f RW and supervision back to room, doffed shoes mod I, and transferred sit<>semi-reclined mod I. Concluded session with pt semi-reclined in bed, needs within  reach, and bed alarm on. Family present at bedside.   Therapy Documentation Precautions:  Precautions Precautions: Fall Precaution Comments: nausea/dizziness/lightheadedness, hard of hearing, motion  sensitivity Restrictions Weight Bearing Restrictions: No  Therapy/Group: Individual Therapy Alfonse Alpers PT, DPT  02/01/2022, 7:20 AM

## 2022-02-02 DIAGNOSIS — R110 Nausea: Secondary | ICD-10-CM | POA: Diagnosis not present

## 2022-02-02 DIAGNOSIS — I614 Nontraumatic intracerebral hemorrhage in cerebellum: Secondary | ICD-10-CM | POA: Diagnosis not present

## 2022-02-02 DIAGNOSIS — R42 Dizziness and giddiness: Secondary | ICD-10-CM | POA: Diagnosis not present

## 2022-02-02 DIAGNOSIS — I10 Essential (primary) hypertension: Secondary | ICD-10-CM

## 2022-02-02 MED ORDER — AMLODIPINE BESYLATE 5 MG PO TABS
5.0000 mg | ORAL_TABLET | Freq: Every day | ORAL | Status: DC
  Administered 2022-02-03 – 2022-02-04 (×2): 5 mg via ORAL
  Filled 2022-02-02 (×2): qty 1

## 2022-02-02 MED ORDER — MECLIZINE HCL 25 MG PO TABS
12.5000 mg | ORAL_TABLET | Freq: Three times a day (TID) | ORAL | Status: DC | PRN
  Administered 2022-02-02 – 2022-02-04 (×3): 12.5 mg via ORAL
  Filled 2022-02-02 (×3): qty 1

## 2022-02-02 MED ORDER — MECLIZINE HCL 25 MG PO TABS
12.5000 mg | ORAL_TABLET | Freq: Three times a day (TID) | ORAL | Status: DC
Start: 2022-02-02 — End: 2022-02-02

## 2022-02-02 MED ORDER — SCOPOLAMINE 1 MG/3DAYS TD PT72
1.0000 | MEDICATED_PATCH | TRANSDERMAL | Status: DC
  Administered 2022-02-02: 1.5 mg via TRANSDERMAL
  Filled 2022-02-02: qty 1

## 2022-02-02 NOTE — Progress Notes (Signed)
Occupational Therapy Session Note  Patient Details  Name: Molly Patterson MRN: [id number] Date of Birth: [birth year]  Today's Date: 02/02/2022 OT Individual Time: 1346-1446 OT Individual Time Calculation (min): 60 min    Short Term Goals: Week 1:  OT Short Term Goal 1 (Week 1): Patient will dress herself with no more than set up assistance OT Short Term Goal 2 (Week 1): Patient wil shower with min assistance OT Short Term Goal 3 (Week 1): Patient will ambulate to the bathroom and complete all toileting with intermittent min assistance  Skilled Therapeutic Interventions/Progress Updates:  Pt greeted seated in straight back chair reporting increased nausea but agreeable to attempt OT intervention. Pt completed functional ambulation from chair >bathroom with rw and CGA for increased safety as pt did have one LOB during ambulation as pt actively dry heaving. Pt with +urine void, completing 3/3 toilting tasks with CGA for increased safety d/t earlier LOB. Pt transported to apt with total A for time mgmt where pt able to complete simulated meal prep task of making oatmeal with CGA with RW. Pt reports her kitchen is very well set -up and if something is out of reach pt will ask her husband for help. Pt needed MIN verbal cues to remember to use her walker bag however likely d/t novel task.  Of note decided that pt will not be doing laundry initially when arriving home as she would have to navigate steps while holding her laundry. Pt also completed IADL task of collecting laundry around apt with RW and CGA however while reaching at knee level to retreive item pt began to dry heave noted to have emesis episode.  Returned pt to room in w/c with total A and alerted nurse. Pt completed stand pivot back to bed with Rw and supervision. Pt left supine in bed with bed alarm activated and all needs within reach.          Therapy Documentation Precautions:  Precautions Precautions: Fall Precaution Comments:  nausea/dizziness/lightheadedness, hard of hearing, motion sensitivity Restrictions Weight Bearing Restrictions: No  Pain: unrated general malaise from nausea, utilized deep breathing and cold wash cloth as pain mgmt strategies.     Therapy/Group: Individual Therapy  [name] 02/02/2022, 3:56 PM

## 2022-02-02 NOTE — Progress Notes (Signed)
PROGRESS NOTE   Subjective/Complaints: Complains of nausea and dizziness. Nausea is often because of dizziness. Scopolamine patch ordered and meclizine scheduled. No other complaints   ROS- Denies CP, SOB, +nausea  Objective:   No results found. No results for input(s): WBC, HGB, HCT, PLT in the last 72 hours.  No results for input(s): NA, K, CL, CO2, GLUCOSE, BUN, CREATININE, CALCIUM in the last 72 hours.   Intake/Output Summary (Last 24 hours) at 02/02/2022 2010 Last data filed at 02/02/2022 1827 Gross per 24 hour  Intake 600 ml  Output 1 ml  Net 599 ml        Physical Exam: Vital Signs Blood pressure 110/65, pulse 86, temperature 98.7 F (37.1 C), temperature source Oral, resp. rate 18, height '5\' 2"'$  (1.575 m), weight 54.8 kg, SpO2 96 %.  Gen: no distress, normal appearing HEENT: oral mucosa pink and moist, NCAT Cardio: Reg rate Chest: normal effort, normal rate of breathing Abd: soft, non-distended Ext: no edema Psych: pleasant, normal affect Skin: intact Neurologic: Cranial nerves II through XII intact, motor strength is 5/5 in bilateral deltoid, bicep, tricep, grip, hip flexor, knee extensors, ankle dorsiflexor and plantar flexor Sensory exam normal sensation to light touch and proprioception in bilateral upper and lower extremities Cerebellar exam normal finger to nose to finger as well as heel to shin in bilateral upper and lower extremities Musculoskeletal: Full range of motion in all 4 extremities. No joint swelling   Assessment/Plan: 1. Functional deficits which require 3+ hours per day of interdisciplinary therapy in a comprehensive inpatient rehab setting. Physiatrist is providing close team supervision and 24 hour management of active medical problems listed below. Physiatrist and rehab team continue to assess barriers to discharge/monitor patient progress toward functional and medical goals  Care  Tool:  Bathing    Body parts bathed by patient: Right arm, Left arm, Chest, Abdomen, Front perineal area, Buttocks, Right upper leg, Left upper leg, Right lower leg, Left lower leg, Face         Bathing assist Assist Level: Supervision/Verbal cueing     Upper Body Dressing/Undressing Upper body dressing   What is the patient wearing?: Pull over shirt    Upper body assist Assist Level: Independent with assistive device    Lower Body Dressing/Undressing Lower body dressing      What is the patient wearing?: Pants, Underwear/pull up     Lower body assist Assist for lower body dressing: Supervision/Verbal cueing     Toileting Toileting    Toileting assist Assist for toileting: Contact Guard/Touching assist     Transfers Chair/bed transfer  Transfers assist     Chair/bed transfer assist level: Supervision/Verbal cueing     Locomotion Ambulation   Ambulation assist      Assist level: Supervision/Verbal cueing Assistive device: Walker-rolling Max distance: >181f   Walk 10 feet activity   Assist     Assist level: Supervision/Verbal cueing Assistive device: Walker-rolling   Walk 50 feet activity   Assist    Assist level: Supervision/Verbal cueing Assistive device: Walker-rolling    Walk 150 feet activity   Assist    Assist level: Supervision/Verbal cueing Assistive device: Walker-rolling  Walk 10 feet on uneven surface  activity   Assist     Assist level: Supervision/Verbal cueing Assistive device: Walker-rolling   Wheelchair     Assist Is the patient using a wheelchair?: No             Wheelchair 50 feet with 2 turns activity    Assist            Wheelchair 150 feet activity     Assist          Blood pressure 110/65, pulse 86, temperature 98.7 F (37.1 C), temperature source Oral, resp. rate 18, height '5\' 2"'$  (1.575 m), weight 54.8 kg, SpO2 96 %.  Medical Problem List and Plan: 1. 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  Impaired mobility, ambulating 275 feet: d/c Lovenox             -antiplatelet therapy: N/A due to bleed 3. Pain: continue tylenol prn.  4. Mood: LCSW to follow for evaluation and support.              -antipsychotic agents: N/A 5. Neuropsych: This patient is capable of making decisions on her own behalf. No apparent cognitive deficits 6. Skin/Wound Care: Routine pressure relief measures.  7. Fluids/Electrolytes/Nutrition: Monitor I/O. BMET nl  8. HTN: Monitor BP TID. Stable on home dose meds --continue HCTZ, Amlodipine and Benazepril  Decrease amlodipine to '5mg'$    Vitals:   02/02/22 1309 02/02/22 1917  BP: 107/65 110/65  Pulse: 89 86  Resp: 16 18  Temp: 98.4 F (36.9 C) 98.7 F (37.1 C)  SpO2: 97% 96%  Orthostatic vitals normal this am - feel dizziness is CVA related , no orthostasis    BP controlled 5/21 9. Hyper lipidemia: On Statin (but does not plan on continuing it at discharge)             --continue to educate on compliance.  10. Leucocytosis: Monitor for fevers and other signs of infection 11. Vestibular symptoms: changed compazine ot zofran to reduce sedation - will schedule  --Has an episode last night that woke her from sleep/needed meds             --vestibular eval and treat.   -meclizine scheduled.  12. Nausea: scopolamine patch ordered      LOS: 6 days A FACE TO FACE EVALUATION WAS PERFORMED  Martha Clan P Lori Liew 02/02/2022, 8:10 PM

## 2022-02-02 NOTE — Progress Notes (Signed)
Occupational Therapy Note  Patient Details  Name: Molly Patterson MRN: [id number] Date of Birth: [birth year]  Today's Date: 02/02/2022 Attempted to see pt to make up for missed time ( 30 mins), pt with visitor present declining OT intervention at this time, will f/u as time allows to make up for missed minutes.   Corinne Ports [facility] 02/02/2022, 11:11 AM

## 2022-02-03 DIAGNOSIS — I614 Nontraumatic intracerebral hemorrhage in cerebellum: Secondary | ICD-10-CM | POA: Diagnosis not present

## 2022-02-03 LAB — BASIC METABOLIC PANEL
Anion gap: 5 (ref 5–15)
BUN: 16 mg/dL (ref 8–23)
CO2: 32 mmol/L (ref 22–32)
Calcium: 9.1 mg/dL (ref 8.9–10.3)
Chloride: 102 mmol/L (ref 98–111)
Creatinine, Ser: 0.81 mg/dL (ref 0.44–1.00)
GFR, Estimated: >60 mL/min (ref >60)
Glucose, Bld: 96 mg/dL (ref 70–99)
Potassium: 3.6 mmol/L (ref 3.5–5.1)
Sodium: 139 mmol/L (ref 135–145)

## 2022-02-03 LAB — CBC
HCT: 38.7 % (ref 36.0–46.0)
Hemoglobin: 12.6 g/dL (ref 12.0–15.0)
MCH: 29 pg (ref 26.0–34.0)
MCHC: 32.6 g/dL (ref 30.0–36.0)
MCV: 89.2 fL (ref 80.0–100.0)
Platelets: 226 10*3/uL (ref 150–400)
RBC: 4.34 MIL/uL (ref 3.87–5.11)
RDW: 15.4 % (ref 11.5–15.5)
WBC: 7.9 10*3/uL (ref 4.0–10.5)
nRBC: 0 % (ref 0.0–0.2)

## 2022-02-03 NOTE — Discharge Summary (Incomplete)
Physician Discharge Summary  Patient ID: Molly Patterson MRN: [id number] DOB/AGE: 1932-03-02 88 y.o.  Admit date: 01/27/2022 Discharge date: 02/03/2022  Discharge Diagnoses:  Principal Problem:   Cerebellar bleed [facility])   Discharged Condition: {condition:18240}  Significant Diagnostic Studies: CT ANGIO HEAD NECK W WO CM  Result Date: 01/20/2022 CLINICAL DATA:  Neuro deficit, acute, stroke suspected. Acute onset of dizziness. EXAM: CT ANGIOGRAPHY HEAD AND NECK TECHNIQUE: Multidetector CT imaging of the head and neck was performed using the standard protocol during bolus administration of intravenous contrast. Multiplanar CT image reconstructions and MIPs were obtained to evaluate the vascular anatomy. Carotid stenosis measurements (when applicable) are obtained utilizing NASCET criteria, using the distal internal carotid diameter as the denominator. RADIATION DOSE REDUCTION: This exam was performed according to the departmental dose-optimization program which includes automated exposure control, adjustment of the mA and/or kV according to patient size and/or use of iterative reconstruction technique. CONTRAST:  47m OMNIPAQUE IOHEXOL 350 MG/ML SOLN COMPARISON:  None Available. FINDINGS: CTA NECK FINDINGS Aortic arch: Standard 3 vessel aortic arch with a moderate amount of atherosclerotic plaque. Patent brachiocephalic and subclavian arteries without evidence of a significant stenosis. Right carotid system: Patent with minimal scattered atherosclerotic plaque. No evidence of a dissection or stenosis. Left carotid system: Patent with minimal scattered atherosclerotic plaque. No evidence of a dissection or stenosis. Partially retropharyngeal course of the mid cervical ICA. Vertebral arteries: Patent with scattered atherosclerotic plaque involving the left greater than right vertebral arteries. No evidence of a significant stenosis or dissection. Mildly dominant left vertebral artery. Skeleton: Advanced  disc and facet degeneration in the cervical spine. Interbody and facet ankylosis at C2-3. Other neck: No evidence of cervical lymphadenopathy or mass. Upper chest: Mild biapical pleuroparenchymal lung scarring. Minimal dependent atelectasis bilaterally. Review of the MIP images confirms the above findings CTA HEAD FINDINGS Anterior circulation: The internal carotid arteries are patent from skull base to carotid termini with a small to moderate amount of calcified plaque bilaterally not resulting in significant stenosis. ACAs and MCAs are patent with mild atherosclerotic irregularity but no evidence of a proximal branch occlusion or flow limiting proximal stenosis. No aneurysm or vascular malformation is identified. Posterior circulation: The intracranial vertebral arteries are widely patent to the basilar. Patent PICA, AICA, and SCA origins are seen bilaterally. The basilar artery is widely patent. Posterior communicating arteries are diminutive or absent. Patterson PCAs are patent without evidence of a significant proximal stenosis on the left. There is a severe mid to distal right P2 stenosis. No aneurysm or vascular malformation is identified. Venous sinuses: As permitted by contrast timing, patent. Anatomic variants: None. Review of the MIP images confirms the above findings IMPRESSION: 1. No large vessel occlusion, aneurysm, or vascular malformation. 2. Intracranial atherosclerosis with a severe right P2 stenosis. 3. Widely patent cervical carotid and vertebral arteries. 4. Aortic Atherosclerosis (ICD10-I70.0). Electronically Signed   By: ALogan BoresM.D.   On: 01/20/2022 17:03   CT Head Wo Contrast  Result Date: 01/20/2022 CLINICAL DATA:  Dizziness. EXAM: CT HEAD WITHOUT CONTRAST TECHNIQUE: Contiguous axial images were obtained from the base of the skull through the vertex without intravenous contrast. RADIATION DOSE REDUCTION: This exam was performed according to the departmental dose-optimization program  which includes automated exposure control, adjustment of the mA and/or kV according to patient size and/or use of iterative reconstruction technique. COMPARISON:  CT head dated January 12, 2022. FINDINGS: Brain: New small acute intraparenchymal hemorrhage in the right cerebellum (series 3, image 7). No  intraventricular extension. No evidence of acute infarction, hydrocephalus, extra-axial collection or mass lesion/mass effect. Stable mild atrophy and chronic microvascular ischemic changes, within normal limits for age. Vascular: Atherosclerotic vascular calcification of the carotid siphons. No hyperdense vessel. Skull: Normal. Negative for fracture or focal lesion. Sinuses/Orbits: No acute finding. Other: None. IMPRESSION: 1. New small acute intraparenchymal hemorrhage in the right cerebellum. Critical Value/emergent results were called by telephone at the time of interpretation on 01/20/2022 at 2:25 pm to provider [facility], who verbally acknowledged these results. Electronically Signed   By: Titus Dubin M.D.   On: 01/20/2022 14:25   MR BRAIN WO CONTRAST  Result Date: 01/20/2022 CLINICAL DATA:  Initial evaluation for acute dizziness. EXAM: MRI HEAD WITHOUT CONTRAST TECHNIQUE: Multiplanar, multiecho pulse sequences of the brain and surrounding structures were obtained without intravenous contrast. COMPARISON:  Prior CT from earlier the same day. FINDINGS: Brain: Cerebral volume within normal limits for age. Mild chronic microvascular ischemic disease noted involving the periventricular and deep white matter Patterson cerebral hemispheres. Previously identified acute intraparenchymal hemorrhage involving the right cerebellum again seen, grossly stable in size and morphology as compared to previous. No visible underlying lesion on this noncontrast examination. No significant regional mass effect or midline shift. Adjacent fourth ventricle remains widely patent. No other evidence for acute or chronic intracranial  hemorrhage. No other acute large vessel territory infarct. No mass lesion or midline shift. No hydrocephalus or extra-axial fluid collection. Pituitary gland and suprasellar region within normal limits. Midline structures intact. Vascular: Major intracranial vascular flow voids are maintained. Skull and upper cervical spine: Craniocervical junction within normal limits. Bone marrow signal intensity nor heterogeneous but overall within normal limits. No scalp soft tissue abnormality. Sinuses/Orbits: Globes and orbital soft tissues within normal limits. Paranasal sinuses and mastoid air cells are largely clear. Other: None. IMPRESSION: 1. Grossly stable size and morphology of acute hemorrhage involving the right cerebellum. No visible underlying lesion on this noncontrast examination. No significant regional mass effect or midline shift. 2. No other acute intracranial abnormality. 3. Mild chronic microvascular ischemic disease for age. Electronically Signed   By: Jeannine Boga M.D.   On: 01/20/2022 22:17   ECHOCARDIOGRAM COMPLETE  Result Date: 01/22/2022    ECHOCARDIOGRAM REPORT   Patient Name:   [facility] Carmelia Roller Date of Exam: 01/22/2022 Medical Rec #:  [id number]          Height:       62.8 in Accession #:    [id number]         Weight:       127.6 lb Date of Birth:  19-Dec-1931          BSA:          1.593 m Patient Age:    28 years           BP:           112/68 mmHg Patient Gender: F                  HR:           84 bpm. Exam Location:  Inpatient Procedure: 2D Echo, Color Doppler and Limited Color Doppler Indications:    Stroke  History:        Patient has no prior history of Echocardiogram examinations.  Sonographer:    Joette Catching RCS Referring Phys: [id number] [place] E DE LA [place]  1. Left ventricular ejection fraction, by estimation, is 60 to 65%. The left ventricle has  normal function. The left ventricle has no regional wall motion abnormalities. Left ventricular diastolic parameters  are consistent with Grade I diastolic dysfunction (impaired relaxation).  2. Right ventricular systolic function is normal. The right ventricular size is normal.  3. The mitral valve is normal in structure. Trivial mitral valve regurgitation.  4. The aortic valve is tricuspid. There is mild calcification of the aortic valve. There is mild thickening of the aortic valve. Aortic valve regurgitation is trivial. Aortic valve sclerosis/calcification is present, without any evidence of aortic stenosis.  5. The inferior vena cava is normal in size with greater than 50% respiratory variability, suggesting right atrial pressure of 3 mmHg. Comparison(s): No prior Echocardiogram. Conclusion(s)/Recommendation(s): No intracardiac source of embolism detected on this transthoracic study. Consider a transesophageal echocardiogram to exclude cardiac source of embolism if clinically indicated. FINDINGS  Left Ventricle: Left ventricular ejection fraction, by estimation, is 60 to 65%. The left ventricle has normal function. The left ventricle has no regional wall motion abnormalities. The left ventricular internal cavity size was normal in size. There is  no left ventricular hypertrophy. Left ventricular diastolic parameters are consistent with Grade I diastolic dysfunction (impaired relaxation). Right Ventricle: The right ventricular size is normal. No increase in right ventricular wall thickness. Right ventricular systolic function is normal. Left Atrium: Left atrial size was normal in size. Right Atrium: Right atrial size was normal in size. Pericardium: There is no evidence of pericardial effusion. Mitral Valve: The mitral valve is normal in structure. Trivial mitral valve regurgitation. Tricuspid Valve: The tricuspid valve is normal in structure. Tricuspid valve regurgitation is trivial. Aortic Valve: The aortic valve is tricuspid. There is mild calcification of the aortic valve. There is mild thickening of the aortic valve.  Aortic valve regurgitation is trivial. Aortic valve sclerosis/calcification is present, without any evidence of aortic stenosis. Aortic valve mean gradient measures 6.0 mmHg. Aortic valve peak gradient measures 10.8 mmHg. Aortic valve area, by VTI measures 1.54 cm. Pulmonic Valve: The pulmonic valve was not well visualized. Pulmonic valve regurgitation is trivial. Aorta: The aortic root and ascending aorta are structurally normal, with no evidence of dilitation. Venous: The inferior vena cava is normal in size with greater than 50% respiratory variability, suggesting right atrial pressure of 3 mmHg. IAS/Shunts: The atrial septum is grossly normal.  LEFT VENTRICLE PLAX 2D LVIDd:         5.20 cm   Diastology LVIDs:         3.50 cm   LV e' medial:    4.35 cm/s LV PW:         0.70 cm   LV E/e' medial:  11.2 LV IVS:        0.60 cm   LV e' lateral:   7.18 cm/s LVOT diam:     1.70 cm   LV E/e' lateral: 6.8 LV SV:         47 LV SV Index:   30 LVOT Area:     2.27 cm  RIGHT VENTRICLE             IVC RV Basal diam:  3.90 cm     IVC diam: 1.40 cm RV Mid diam:    3.00 cm RV S prime:     21.90 cm/s TAPSE (M-mode): 2.2 cm LEFT ATRIUM             Index        RIGHT ATRIUM           Index LA  diam:        3.10 cm 1.95 cm/m   RA Area:     12.10 cm LA Vol (A2C):   25.1 ml 15.75 ml/m  RA Volume:   26.40 ml  16.57 ml/m LA Vol (A4C):   38.3 ml 24.04 ml/m LA Biplane Vol: 32.0 ml 20.08 ml/m  AORTIC VALVE                     PULMONIC VALVE AV Area (Vmax):    1.44 cm      PV Vmax:       0.84 m/s AV Area (Vmean):   1.45 cm      PV Peak grad:  2.8 mmHg AV Area (VTI):     1.54 cm AV Vmax:           164.00 cm/s AV Vmean:          121.000 cm/s AV VTI:            0.306 m AV Peak Grad:      10.8 mmHg AV Mean Grad:      6.0 mmHg LVOT Vmax:         104.00 cm/s LVOT Vmean:        77.100 cm/s LVOT VTI:          0.208 m LVOT/AV VTI ratio: 0.68  AORTA Ao Root diam: 3.10 cm Ao Asc diam:  3.20 cm MITRAL VALVE               TRICUSPID VALVE MV Area  (PHT): 8.25 cm    TR Peak grad:   27.2 mmHg MV Decel Time: 92 msec     TR Vmax:        261.00 cm/s MV E velocity: 48.80 cm/s MV A velocity: 83.60 cm/s  SHUNTS MV E/A ratio:  0.58        Systemic VTI:  0.21 m                            Systemic Diam: 1.70 cm Gwyndolyn Kaufman MD Electronically signed by Gwyndolyn Kaufman MD Signature Date/Time: 01/22/2022/12:41:22 PM    Final     Labs:  Basic Metabolic Panel: Recent Labs  Lab 01/28/22 0541 02/03/22 0541  NA 139 139  K 3.7 3.6  CL 103 102  CO2 30 32  GLUCOSE 94 96  BUN 14 16  CREATININE 0.69 0.81  CALCIUM 9.2 9.1    CBC: Recent Labs  Lab 01/28/22 0541 02/03/22 0541  WBC 9.7 7.9  NEUTROABS 6.5  --   HGB 13.4 12.6  HCT 41.1 38.7  MCV 89.5 89.2  PLT 236 226    CBG: No results for input(s): GLUCAP in the last 168 hours.  Brief HPI:   Molly Patterson is a 88 y.o. female    Hospital Course: Molly Patterson was admitted to rehab 01/27/2022 for inpatient therapies to consist of PT, ST and OT at least three hours five days a week. Past admission physiatrist, therapy team and rehab RN have worked together to provide customized collaborative inpatient rehab.   Blood pressures were monitored on TID basis and   Rehab course: During patient's stay in rehab weekly team conferences were held to monitor patient's progress, set goals and discuss barriers to discharge. At admission, patient required  She  has had improvement in activity tolerance, balance, postural control as well as ability to compensate for deficits.  Discharge disposition: 01-Home or Self Care        Diet:  Special Instructions:  Discharge Instructions     Ambulatory referral to Neurology   Complete by: As directed    An appointment is requested in approximately: 4 weeks right cerebellar [place]   Ambulatory referral to Physical Medicine Rehab   Complete by: As directed    Moderate complexity follow-up 1 to 2 weeks right cerebellar ICH       Allergies as of 02/03/2022       Reactions   Penicillins Shortness Of Breath, Swelling   REACTION: hives   Lidocaine-epinephrine    REACTION: pulse rapic   Rofecoxib    Vioxx, itiching     Med Rec must be completed prior to using this [facility]***       Follow-up Information     Newman Pies, MD Follow up.   Specialty: Neurosurgery Why: Call for appointment Contact information: [address] [place] [address] [phone]         Charlett Blake, MD Follow up.   Specialty: Physical Medicine and Rehabilitation Why: Office to call for appointment Contact information: [place] [address] [phone]                 Signed: Bary Leriche 02/03/2022, 10:42 AM

## 2022-02-03 NOTE — Progress Notes (Signed)
Occupational Therapy Discharge Summary  Patient Details  Name: Molly Patterson MRN: [id number] Date of Birth: [birth year]  Today's Date: 02/03/2022 OT Individual Time: 1300-1400 OT Individual Time Calculation (min): 60 min   Pt greeted seated in wc and agreeable to OT treatment session. Pt reported completing BADL tasks at earlier session. Pt ambulated to therapy gym without AD and CGA/min A with intermittent scissoring and lateral LOB. Addressed dynamic balance standing on Biodex. OT had pt visualize center  of gravity with black dot, then incorporate hip and ankle strategies to move around screen. Facilitation at hips initially to elicit weight shift. Standing balance and UB there-ex with pt standing on foam block and completing 3x10 chest press, bicep curl and straight arm raise using 2 lb dowel rod. Functional ambulation and balance with ambulating while balancing ball on saucer. Pt intermittently lost ball with LOB, but able to correct LOB 60% of the time. Pt ambulated back to room without AD in similar fashion. Pt left semi-reclined in bed with bed alarm on, call bell in reach, and needs met.    Patient has met 13 of 13 long term goals due to improved activity tolerance, improved balance, postural control, and ability to compensate for deficits.  Patient to discharge at overall Modified Independent level.  Patient's care partner is independent to provide the necessary physical assistance at discharge for higher level iADL tasks.    Reasons goals not met: n/a  Recommendation:  Patient will benefit from ongoing skilled OT services in outpatient setting to continue to advance functional skills in the area of BADL.  Equipment: No equipment provided  Reasons for discharge: treatment goals met and discharge from hospital  Patient/family agrees with progress made and goals achieved: Yes  OT Discharge Precautions/Restrictions  Restrictions Weight Bearing Restrictions: No Pain Pain  Assessment Pain Score: 0-No pain ADL ADL Eating: Independent Where Assessed-Eating: Bed level Grooming: Setup Where Assessed-Grooming: Sitting at sink Upper Body Bathing: Minimal assistance Where Assessed-Upper Body Bathing: Sitting at sink Lower Body Bathing: Minimal assistance Where Assessed-Lower Body Bathing: Sitting at sink Upper Body Dressing: Minimal assistance Lower Body Dressing: Minimal assistance Where Assessed-Lower Body Dressing: Sitting at sink Toileting: Minimal assistance Where Assessed-Toileting: Glass blower/designer: Psychiatric nurse Method: Counselling psychologist: Energy manager: Not assessed Vision Baseline Vision/History: 1 Wears glasses (readers) Patient Visual Report: No change from baseline Vision Assessment?: No apparent visual deficits Additional Comments: wears reading glasses Perception  Perception: Within Functional Limits Praxis Praxis: Intact Cognition Cognition Overall Cognitive Status: Within Functional Limits for tasks assessed Arousal/Alertness: Awake/alert Orientation Level: Person;Place;Situation Person: Oriented Place: Oriented Situation: Oriented Memory: Appears intact Attention: Focused;Sustained;Selective Focused Attention: Appears intact Sustained Attention: Appears intact Selective Attention: Appears intact Awareness: Appears intact Problem Solving: Appears intact Safety/Judgment: Appears intact Brief Interview for Mental Status (BIMS) Repetition of Three Words (First Attempt): 3 Temporal Orientation: Year: Correct Temporal Orientation: Month: Accurate within 5 days Temporal Orientation: Day: Correct Recall: "Sock": Yes, no cue required Recall: "Blue": Yes, no cue required Recall: "Bed": Yes, after cueing ("a piece of furniture") BIMS Summary Score: 14 Sensation Sensation Light Touch: Appears Intact Hot/Cold: Appears Intact Proprioception: Appears  Intact Stereognosis: Not tested Coordination Gross Motor Movements are Fluid and Coordinated: Yes Fine Motor Movements are Fluid and Coordinated: Yes Coordination and Movement Description: GM movements are fluid and coordinated for basic functional mobility tasks. Heel Shin Test: NT 9 Hole Peg Test: RUE 31secs LUE: 32 secs Motor  Motor Motor: Other (comment) Motor -  Discharge Observations: motion sensitivity, moves slowly and carefully Mobility  Bed Mobility Bed Mobility: Rolling Right;Rolling Left;Supine to Sit;Sit to Supine Rolling Right: Independent Rolling Left: Independent Supine to Sit: Independent Sit to Supine: Independent Transfers Sit to Stand: Independent with assistive device Stand to Sit: Independent with assistive device  Trunk/Postural Assessment  Cervical Assessment Cervical Assessment: Within Functional Limits Thoracic Assessment Thoracic Assessment: Within Functional Limits Lumbar Assessment Lumbar Assessment: Within Functional Limits Postural Control Postural Control: Within Functional Limits (limited endurance) Righting Reactions: slower in standing, but adequate with RW  Extremity/Trunk Assessment RUE Assessment RUE Assessment: Within Functional Limits Passive Range of Motion (PROM) Comments: WFL Active Range of Motion (AROM) Comments: arthritis throughout; [facility] General Strength Comments: 4+/5 throughout LUE Assessment LUE Assessment: Within Functional Limits Passive Range of Motion (PROM) Comments: WFL Active Range of Motion (AROM) Comments: arthritis throughout but [facility] General Strength Comments: 4+/5 throughout   Valma Cava 02/03/2022, 2:03 PM

## 2022-02-03 NOTE — Progress Notes (Signed)
Inpatient Rehabilitation Discharge Medication Review by a Pharmacist  A complete drug regimen review was completed for this patient to identify any potential clinically significant medication issues.  High Risk Drug Classes Is patient taking? Indication by Medication  Antipsychotic No   Anticoagulant No   Antibiotic No   Opioid Yes Tramadol- acute pain  Antiplatelet No   Hypoglycemics/insulin No   Vasoactive Medication Yes Norvasc, lotensin, HCTZ- hypertension  Chemotherapy No   Other Yes Protonix- GERD Crestor- HLD Vitamin B-12- anemia Vitamin D3- supplement Meclizine, scopolamine- dizziness Albuterol HFA- asthma/SOB     Type of Medication Issue Identified Description of Issue Recommendation(s)  Drug Interaction(s) (clinically significant)     Duplicate Therapy     Allergy     No Medication Administration End Date     Incorrect Dose     Additional Drug Therapy Needed     Significant med changes from prior encounter (inform family/care partners about these prior to discharge).    Other       Clinically significant medication issues were identified that warrant physician communication and completion of prescribed/recommended actions by midnight of the next day:  No   Time spent performing this drug regimen review (minutes):  30  Shivan Hodes BS, PharmD, BCPS Clinical Pharmacist 02/04/2022 8:40 AM  Contact: [phone] after 3 PM  "Be curious, not judgmental..." -Jamal Maes

## 2022-02-03 NOTE — Discharge Instructions (Addendum)
Inpatient Rehab Discharge Instructions  Molly Patterson Discharge date and time: No discharge date for patient encounter.   Activities/Precautions/ Functional Status: Activity: activity as tolerated Diet: regular diet Wound Care: Routine skin checks Functional status:  ___ No restrictions     ___ Walk up steps independently ___ 24/7 supervision/assistance   ___ Walk up steps with assistance ___ Intermittent supervision/assistance  ___ Bathe/dress independently ___ Walk with walker     _x__ Bathe/dress with assistance ___ Walk Independently    ___ Shower independently ___ Walk with assistance    ___ Shower with assistance ___ No alcohol   COMMUNITY REFERRALS UPON DISCHARGE:    Outpatient: PT                 Agency: Outpatient at [facility] Phone: [phone]              Appointment Date/Time: TBD  Medical Equipment/Items Ordered: Tub Transfer Bench                                                 Agency/Supplier: Patient Owned       ___ Return to work/school ________  Special Instructions: No driving smoking or alcohol   My questions have been answered and I understand these instructions. I will adhere to these goals and the provided educational materials after my discharge from the hospital.  Patient/Caregiver Signature _______________________________ Date __________  Clinician Signature _______________________________________ Date __________  Please bring this form and your medication list with you to all your follow-up doctor's appointments.

## 2022-02-03 NOTE — Progress Notes (Signed)
Physical Therapy Discharge Summary  Patient Details  Name: Molly Patterson MRN: [id number] Date of Birth: Aug 22, 1932  Today's Date: 02/03/2022 PT Individual Time: 0800-0900, 60 min    Patient has met 9 of 9 long term goals due to improved activity tolerance, improved balance, ability to compensate for deficits, and functional use of  right lower extremity.  Patient to discharge at an ambulatory level Modified Independent.   Patient's care partner is independent to provide the necessary guarding assistance at discharge.  Reasons goals not met: na  Recommendation:  Patient will benefit from ongoing skilled PT services in outpatient setting to continue to advance safe functional mobility, address ongoing impairments in balance activity tolerance, gait training, strengthening, and minimize fall risk.  Equipment:   RW  Reasons for discharge: treatment goals met and discharge from hospital  Patient/family agrees with progress made and goals achieved: Yes  PT Discharge Pt seated in wc.  She denied pain.  She reported that she has problems with pain at night due to OA.  Tx focued on balance re-assessment, functional mobility and locomotion for d/c to home. At end of session, pt resting in wc with needs at hand  Precautions/Restrictions Precautions Precautions: Fall Precaution Comments: nausea/dizziness/lightheadedness, hard of hearing, motion sensitivity, improved since eval Restrictions Weight Bearing Restrictions: No  Pain- pt denied   Pain Interference Pain Interference Pain Effect on Sleep: 2. Occasionally (OA pain) Pain Interference with Therapy Activities: 1. Rarely or not at all Pain Interference with Day-to-Day Activities: 1. Rarely or not at all Vision/Perception  Vision - History Ability to See in Adequate Light: 0 Adequate Vision - Assessment Additional Comments: wears reading glasses Perception Perception: Not tested Praxis Praxis: Not tested   Cognition Orientation Level: Oriented X4 Safety/Judgment: Appears intact Sensation Sensation Light Touch: Appears Intact Hot/Cold: Not tested Proprioception: Appears Intact Stereognosis: Not tested Coordination Gross Motor Movements are Fluid and Coordinated: Yes Fine Motor Movements are Fluid and Coordinated: Not tested Coordination and Movement Description: GM movements are fluid and coordinated for basic functional mobility tasks. Heel Shin Test: NT Motor  Motor Motor: Other (comment) Motor - Discharge Observations: motion sensitivity, moves slowly and carefully  Mobility Bed Mobility Bed Mobility: Rolling Right;Rolling Left;Supine to Sit;Sit to Supine Rolling Right: Independent Rolling Left: Independent Supine to Sit: Independent Sit to Supine: Independent Transfers Transfers: Stand to Sit;Sit to Stand Sit to Stand: Independent with assistive device Stand to Sit: Independent with assistive device Stand Pivot Transfers: Independent with assistive device Transfer (Assistive device): Rolling walker Locomotion  Gait Ambulation: Yes Gait Assistance: Supervision/Verbal cueing Assistive device: Rolling walker Gait Gait: Yes Gait Pattern: Impaired Gait Pattern: Narrow base of support;Decreased trunk rotation Stairs / Additional Locomotion Stairs: Yes Stairs Assistance: Supervision/Verbal cueing Stair Management Technique: Two rails Number of Stairs: 12 Height of Stairs: 6 (and 3) Ramp: Supervision/Verbal cueing Wheelchair Mobility Wheelchair Mobility: No  Trunk/Postural Assessment  Cervical Assessment Cervical Assessment: Within Functional Limits Thoracic Assessment Thoracic Assessment: Within Functional Limits Lumbar Assessment Lumbar Assessment: Within Functional Limits Postural Control Postural Control: Within Functional Limits (limited endurance) Righting Reactions: slower in standing, but adequate with RW  Balance Balance Balance Assessed:  Yes Standardized Balance Assessment Standardized Balance Assessment: Berg Balance Test Berg Balance Test Sit to Stand: Able to stand  independently using hands Standing Unsupported: Able to stand safely 2 minutes Sitting with Back Unsupported but Feet Supported on Floor or Stool: Able to sit safely and securely 2 minutes Stand to Sit: Sits safely with minimal use of hands  Transfers: Able to transfer safely, minor use of hands Standing Unsupported with Eyes Closed: Able to stand 10 seconds safely Standing Ubsupported with Feet Together: Able to place feet together independently and stand for 1 minute with supervision From Standing, Reach Forward with Outstretched Arm: Can reach forward >12 cm safely (5") From Standing Position, Pick up Object from Floor: Able to pick up shoe, needs supervision From Standing Position, Turn to Look Behind Over each Shoulder: Looks behind from both sides and weight shifts well Turn 360 Degrees: Able to turn 360 degrees safely but slowly Standing Unsupported, Alternately Place Feet on Step/Stool: Able to complete >2 steps/needs minimal assist Standing Unsupported, One Foot in Front: Needs help to step but can hold 15 seconds Standing on One Leg: Tries to lift leg/unable to hold 3 seconds but remains standing independently Total Score: 41 Extremity Assessment   RLE Assessment RLE Assessment: Within Functional Limits General Strength Comments: grossly in sitting: hip flexors , ankle DF, hip abductors, 4+/4, knee extensors 5/5 LLE Assessment LLE Assessment: Within Functional Limits General Strength Comments: grossly in sitting: hip flexors, ankle DF, hip abductors 4+/5, knee extensors 5/5    COOK,CAROLINE 02/03/2022, 5:22 PM 

## 2022-02-03 NOTE — Progress Notes (Signed)
Inpatient Rehabilitation Care Coordinator Discharge Note   Patient Details  Name: Molly Patterson MRN: [id number] Date of Birth: May 30, 1932   Discharge location: Home  Length of Stay: 8 Days  Discharge activity level: Supervision  Home/community participation: spouse  Patient response BJ:SEGBTD Literacy - How often do you need to have someone help you when you read instructions, pamphlets, or other written material from your doctor or pharmacy?: Never  Patient response VV:OHYWVP Isolation - How often do you feel lonely or isolated from those around you?: Never  Services provided included: MD, RD, PT, OT, SLP, RN, CM, TR, Pharmacy, SW  Financial Services:  Charity fundraiser Utilized: [employer]  Choices offered to/list presented to: Patient  Follow-up services arranged:  Outpatient    Outpatient Servicies: outpatient at [facility]      Patient response to transportation need: Is the patient able to respond to transportation needs?: Yes In the past 12 months, has lack of transportation kept you from medical appointments or from getting medications?: No In the past 12 months, has lack of transportation kept you from meetings, work, or from getting things needed for daily living?: No    Comments (or additional information):  Patient/Family verbalized understanding of follow-up arrangements:  Yes  Individual responsible for coordination of the follow-up plan: Purcell Nails (Spouse)  Confirmed correct DME delivered: Dyanne Iha 02/03/2022    Dyanne Iha

## 2022-02-03 NOTE — Progress Notes (Signed)
Physical Therapy Session Note  Patient Details  Name: Molly Patterson MRN: [id number] Date of Birth: [birth year]  Today's Date: 02/03/2022 PT Individual Time: 0931-0959 PT Individual Time Calculation (min): 28 min   Short Term Goals: Week 1:  PT Short Term Goal 1 (Week 1): = to LTGs based on ELOS  Skilled Therapeutic Interventions/Progress Updates:     Pt received seated in [facility] and agrees to therapy. NO complaint of pain. WC transport to gym for time management. Pt performs sit to stand with cues for hand placement. Pt ambulates x200' with RW and cues for navigation. Pt maintains good posture and appropriate proximity to RW. Pt completes car transfer with cues for hand placement, then ambulates additional 200'. Following seated rest break, pt completes 6 Minute Walk Test with RW and supervision, with score of 1208'. WC transport back to room. Left seated with alarm intact and all needs within reach.  Therapy Documentation Precautions:  Precautions Precautions: Fall Precaution Comments: nausea/dizziness/lightheadedness, hard of hearing, motion sensitivity Restrictions Weight Bearing Restrictions: No   Therapy/Group: Individual Therapy  Breck Coons 02/03/2022, 9:54 AM

## 2022-02-03 NOTE — Progress Notes (Addendum)
PROGRESS NOTE   Subjective/Complaints:  Up with PT , supervision RW DIscussed d/c date Nausea better   ROS- Denies CP, SOB, -N/V  Objective:   No results found. Recent Labs    02/03/22 0541  WBC 7.9  HGB 12.6  HCT 38.7  PLT 226    Recent Labs    02/03/22 0541  NA 139  K 3.6  CL 102  CO2 32  GLUCOSE 96  BUN 16  CREATININE 0.81  CALCIUM 9.1     Intake/Output Summary (Last 24 hours) at 02/03/2022 0803 Last data filed at 02/03/2022 0800 Gross per 24 hour  Intake 840 ml  Output 1 ml  Net 839 ml         Physical Exam: Vital Signs Blood pressure 118/67, pulse 74, temperature 98.1 F (36.7 C), temperature source Oral, resp. rate 18, height '5\' 2"'$  (1.575 m), weight 54.8 kg, SpO2 97 %.   General: No acute distress Mood and affect are appropriate   Musculoskeletal: Full range of motion in all 4 extremities. No joint swelling  Neurologic: Cranial nerves II through XII intact, motor strength is 5/5 in bilateral deltoid, bicep, tricep, grip, hip flexor, knee extensors, ankle dorsiflexor and plantar flexor Sensory exam normal sensation to light touch and proprioception in bilateral upper and lower extremities Cerebellar exam normal finger to nose to finger as well as heel to shin in bilateral upper and lower extremities Musculoskeletal: Full range of motion in all 4 extremities. No joint swelling   Assessment/Plan: 1. Functional deficits which require 3+ hours per day of interdisciplinary therapy in a comprehensive inpatient rehab setting. Physiatrist is providing close team supervision and 24 hour management of active medical problems listed below. Physiatrist and rehab team continue to assess barriers to discharge/monitor patient progress toward functional and medical goals  Care Tool:  Bathing    Body parts bathed by patient: Right arm, Left arm, Chest, Abdomen, Front perineal area, Buttocks, Right  upper leg, Left upper leg, Right lower leg, Left lower leg, Face         Bathing assist Assist Level: Supervision/Verbal cueing     Upper Body Dressing/Undressing Upper body dressing   What is the patient wearing?: Pull over shirt    Upper body assist Assist Level: Independent with assistive device    Lower Body Dressing/Undressing Lower body dressing      What is the patient wearing?: Pants, Underwear/pull up     Lower body assist Assist for lower body dressing: Supervision/Verbal cueing     Toileting Toileting    Toileting assist Assist for toileting: Contact Guard/Touching assist     Transfers Chair/bed transfer  Transfers assist     Chair/bed transfer assist level: Supervision/Verbal cueing     Locomotion Ambulation   Ambulation assist      Assist level: Supervision/Verbal cueing Assistive device: Walker-rolling Max distance: >162f   Walk 10 feet activity   Assist     Assist level: Supervision/Verbal cueing Assistive device: Walker-rolling   Walk 50 feet activity   Assist    Assist level: Supervision/Verbal cueing Assistive device: Walker-rolling    Walk 150 feet activity   Assist    Assist level:  Supervision/Verbal cueing Assistive device: Walker-rolling    Walk 10 feet on uneven surface  activity   Assist     Assist level: Supervision/Verbal cueing Assistive device: Walker-rolling   Wheelchair     Assist Is the patient using a wheelchair?: No             Wheelchair 50 feet with 2 turns activity    Assist            Wheelchair 150 feet activity     Assist          Blood pressure 118/67, pulse 74, temperature 98.1 F (36.7 C), temperature source Oral, resp. rate 18, height '5\' 2"'$  (1.575 m), weight 54.8 kg, SpO2 97 %.  Medical Problem List and Plan: 1. 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  Impaired mobility, ambulating 275 feet: d/c Lovenox             -antiplatelet therapy: N/A due to bleed 3. Pain: continue tylenol prn.  4. Mood: LCSW to follow for evaluation and support.              -antipsychotic agents: N/A 5. Neuropsych: This patient is capable of making decisions on her own behalf. No apparent cognitive deficits 6. Skin/Wound Care: Routine pressure relief measures.  7. Fluids/Electrolytes/Nutrition: Monitor I/O. BMET nl  8. HTN: Monitor BP TID. Stable on home dose meds --continue HCTZ, Amlodipine and Benazepril  Decrease amlodipine to '5mg'$    Vitals:   02/02/22 1917 02/03/22 0350  BP: 110/65 118/67  Pulse: 86 74  Resp: 18 18  Temp: 98.7 F (37.1 C) 98.1 F (36.7 C)  SpO2: 96% 97%   Orthostatic vitals normal this am - feel dizziness is CVA related , no orthostasis    BP controlled 5/22 9. Hyper lipidemia: On Statin (but does not plan on continuing it at discharge)             --continue to educate on compliance.  10. Leucocytosis: Monitor for fevers and other signs of infection 11. Vestibular symptoms: changed compazine ot zofran to reduce sedation - will schedule  --Has an episode last night that woke her from sleep/needed meds             --vestibular eval and treat.   -meclizine scheduled.  12. Nausea: scopolamine patch ordered      LOS: 7 days A FACE TO FACE EVALUATION WAS PERFORMED  Charlett Blake 02/03/2022, 8:03 AM

## 2022-02-03 NOTE — Progress Notes (Signed)
Occupational Therapy Session Note  Patient Details  Name: Molly Patterson MRN: [id number] Date of Birth: 14-Apr-1932  Today's Date: 02/03/2022 OT Individual Time: 1308-6578 OT Individual Time Calculation (min): 46 min    Short Term Goals: Week 1:  OT Short Term Goal 1 (Week 1): Patient will dress herself with no more than set up assistance OT Short Term Goal 2 (Week 1): Patient wil shower with min assistance OT Short Term Goal 3 (Week 1): Patient will ambulate to the bathroom and complete all toileting with intermittent min assistance  Skilled Therapeutic Interventions/Progress Updates:  Pt greeted  seated in chair  agreeable to OT intervention. Pt completed functional ambulation from chair to bathroom to complete toileting tasks MODI. Pt completed shower transfer and bathing MODI. Pt dressed from bathroom, MOD I.  Completed necessary DC assessments as indicated below. Education provided to pts family on pts current level of assist, family verbalized understanding of education. Pt ambulated to gym with Rw MOD I . Pt completed 9 HPT as indicated below. Issued pt BUE HEP for home to maintain UB strength and endurance. Pt completed below therex as indicated below with level 3 theraband:  X10 shoulder flexion  X10 bicep curls X10 shoulder horizontal ABD X10 shoulder diagonal pulls X10 shoulder extension  X10 alternating punches X10 bilateral shoulder external rotation   Issued pt written HEP to increase carryover  Pt ambulated back to room and left up in chair with all needs within reach.                     Therapy Documentation Precautions:  Precautions Precautions: Fall Precaution Comments: nausea/dizziness/lightheadedness, hard of hearing, motion sensitivity Restrictions Weight Bearing Restrictions: No    Pain: Pain Assessment Pain Score: 0-No pain ADL: ADL Eating: Independent Where Assessed-Eating: Bed level Grooming: Setup Where Assessed-Grooming: Sitting at  sink Upper Body Bathing: Minimal assistance Where Assessed-Upper Body Bathing: Sitting at sink Lower Body Bathing: Minimal assistance Where Assessed-Lower Body Bathing: Sitting at sink Upper Body Dressing: Minimal assistance Lower Body Dressing: Minimal assistance Where Assessed-Lower Body Dressing: Sitting at sink Toileting: Minimal assistance Where Assessed-Toileting: Glass blower/designer: Psychiatric nurse Method: Counselling psychologist: Energy manager: Not assessed Vision Baseline Vision/History: 1 Wears glasses (readers) Patient Visual Report: No change from baseline Vision Assessment?: No apparent visual deficits Additional Comments: wears reading glasses Perception  Perception: Within Functional Limits Praxis Praxis: Intact Balance Balance Balance Assessed: Yes Standardized Balance Assessment Standardized Balance Assessment: Berg Balance Test Berg Balance Test Sit to Stand: Able to stand  independently using hands Standing Unsupported: Able to stand safely 2 minutes Sitting with Back Unsupported but Feet Supported on Floor or Stool: Able to sit safely and securely 2 minutes Stand to Sit: Sits safely with minimal use of hands Transfers: Able to transfer safely, minor use of hands Standing Unsupported with Eyes Closed: Able to stand 10 seconds safely Standing Ubsupported with Feet Together: Able to place feet together independently and stand for 1 minute with supervision From Standing, Reach Forward with Outstretched Arm: Can reach forward >12 cm safely (5") From Standing Position, Pick up Object from Floor: Able to pick up shoe, needs supervision From Standing Position, Turn to Look Behind Over each Shoulder: Looks behind from both sides and weight shifts well Turn 360 Degrees: Able to turn 360 degrees safely but slowly Standing Unsupported, Alternately Place Feet on Step/Stool: Able to complete >2 steps/needs minimal  assist Standing Unsupported, One Foot in Front: Needs  help to step but can hold 15 seconds Standing on One Leg: Tries to lift leg/unable to hold 3 seconds but remains standing independently Total Score: 41    Therapy/Group: Individual Therapy  [name] 02/03/2022, 12:17 PM

## 2022-02-03 NOTE — Plan of Care (Signed)
  Problem: RH Balance Goal: LTG: Patient will maintain dynamic sitting balance (OT) Description: LTG:  Patient will maintain dynamic sitting balance with assistance during activities of daily living (OT) Outcome: Completed/Met Goal: LTG Patient will maintain dynamic standing with ADLs (OT) Description: LTG:  Patient will maintain dynamic standing balance with assist during activities of daily living (OT)  Outcome: Completed/Met   Problem: Sit to Stand Goal: LTG:  Patient will perform sit to stand in prep for activites of daily living with assistance level (OT) Description: LTG:  Patient will perform sit to stand in prep for activites of daily living with assistance level (OT) Outcome: Completed/Met   Problem: RH Grooming Goal: LTG Patient will perform grooming w/assist,cues/equip (OT) Description: LTG: Patient will perform grooming with assist, with/without cues using equipment (OT) Outcome: Completed/Met   Problem: RH Bathing Goal: LTG Patient will bathe all body parts with assist levels (OT) Description: LTG: Patient will bathe all body parts with assist levels (OT) Outcome: Completed/Met   Problem: RH Dressing Goal: LTG Patient will perform upper body dressing (OT) Description: LTG Patient will perform upper body dressing with assist, with/without cues (OT). Outcome: Completed/Met Goal: LTG Patient will perform lower body dressing w/assist (OT) Description: LTG: Patient will perform lower body dressing with assist, with/without cues in positioning using equipment (OT) Outcome: Completed/Met   Problem: RH Toileting Goal: LTG Patient will perform toileting task (3/3 steps) with assistance level (OT) Description: LTG: Patient will perform toileting task (3/3 steps) with assistance level (OT)  Outcome: Completed/Met   Problem: RH Simple Meal Prep Goal: LTG Patient will perform simple meal prep w/assist (OT) Description: LTG: Patient will perform simple meal prep with assistance,  with/without cues (OT). Outcome: Completed/Met   Problem: RH Laundry Goal: LTG Patient will perform laundry w/assist, cues (OT) Description: LTG: Patient will perform laundry with assistance, with/without cues (OT). Outcome: Completed/Met   Problem: RH Light Housekeeping Goal: LTG Patient will perform light housekeeping w/assist (OT) Description: LTG: Patient will perform light housekeeping with assistance, with/without cues (OT). Outcome: Completed/Met   Problem: RH Toilet Transfers Goal: LTG Patient will perform toilet transfers w/assist (OT) Description: LTG: Patient will perform toilet transfers with assist, with/without cues using equipment (OT) Outcome: Completed/Met   Problem: RH Tub/Shower Transfers Goal: LTG Patient will perform tub/shower transfers w/assist (OT) Description: LTG: Patient will perform tub/shower transfers with assist, with/without cues using equipment (OT) Outcome: Completed/Met

## 2022-02-03 NOTE — H&P (Signed)
Physician Discharge Summary  Patient ID: Molly Patterson MRN: [id number] DOB/AGE: May 11, 1932 88 y.o.  Admit date: 01/27/2022 Discharge date: 02/04/2021  Discharge Diagnoses:  Principal Problem:   Cerebellar bleed [facility]) DVT prophylaxis Hypertension Hyperlipidemia Vestibular symptoms Raynaud's    Discharged Condition: Stable  Significant Diagnostic Studies: CT ANGIO HEAD NECK W WO CM  Result Date: 01/20/2022 CLINICAL DATA:  Neuro deficit, acute, stroke suspected. Acute onset of dizziness. EXAM: CT ANGIOGRAPHY HEAD AND NECK TECHNIQUE: Multidetector CT imaging of the head and neck was performed using the standard protocol during bolus administration of intravenous contrast. Multiplanar CT image reconstructions and MIPs were obtained to evaluate the vascular anatomy. Carotid stenosis measurements (when applicable) are obtained utilizing NASCET criteria, using the distal internal carotid diameter as the denominator. RADIATION DOSE REDUCTION: This exam was performed according to the departmental dose-optimization program which includes automated exposure control, adjustment of the mA and/or kV according to patient size and/or use of iterative reconstruction technique. CONTRAST:  71m OMNIPAQUE IOHEXOL 350 MG/ML SOLN COMPARISON:  None Available. FINDINGS: CTA NECK FINDINGS Aortic arch: Standard 3 vessel aortic arch with a moderate amount of atherosclerotic plaque. Patent brachiocephalic and subclavian arteries without evidence of a significant stenosis. Right carotid system: Patent with minimal scattered atherosclerotic plaque. No evidence of a dissection or stenosis. Left carotid system: Patent with minimal scattered atherosclerotic plaque. No evidence of a dissection or stenosis. Partially retropharyngeal course of the mid cervical ICA. Vertebral arteries: Patent with scattered atherosclerotic plaque involving the left greater than right vertebral arteries. No evidence of a significant stenosis or  dissection. Mildly dominant left vertebral artery. Skeleton: Advanced disc and facet degeneration in the cervical spine. Interbody and facet ankylosis at C2-3. Other neck: No evidence of cervical lymphadenopathy or mass. Upper chest: Mild biapical pleuroparenchymal lung scarring. Minimal dependent atelectasis bilaterally. Review of the MIP images confirms the above findings CTA HEAD FINDINGS Anterior circulation: The internal carotid arteries are patent from skull base to carotid termini with a small to moderate amount of calcified plaque bilaterally not resulting in significant stenosis. ACAs and MCAs are patent with mild atherosclerotic irregularity but no evidence of a proximal branch occlusion or flow limiting proximal stenosis. No aneurysm or vascular malformation is identified. Posterior circulation: The intracranial vertebral arteries are widely patent to the basilar. Patent PICA, AICA, and SCA origins are seen bilaterally. The basilar artery is widely patent. Posterior communicating arteries are diminutive or absent. Both PCAs are patent without evidence of a significant proximal stenosis on the left. There is a severe mid to distal right P2 stenosis. No aneurysm or vascular malformation is identified. Venous sinuses: As permitted by contrast timing, patent. Anatomic variants: None. Review of the MIP images confirms the above findings IMPRESSION: 1. No large vessel occlusion, aneurysm, or vascular malformation. 2. Intracranial atherosclerosis with a severe right P2 stenosis. 3. Widely patent cervical carotid and vertebral arteries. 4. Aortic Atherosclerosis (ICD10-I70.0). Electronically Signed   By: ALogan BoresM.D.   On: 01/20/2022 17:03   CT Head Wo Contrast  Result Date: 01/20/2022 CLINICAL DATA:  Dizziness. EXAM: CT HEAD WITHOUT CONTRAST TECHNIQUE: Contiguous axial images were obtained from the base of the skull through the vertex without intravenous contrast. RADIATION DOSE REDUCTION: This exam was  performed according to the departmental dose-optimization program which includes automated exposure control, adjustment of the mA and/or kV according to patient size and/or use of iterative reconstruction technique. COMPARISON:  CT head dated January 12, 2022. FINDINGS: Brain: New small acute intraparenchymal hemorrhage in  the right cerebellum (series 3, image 7). No intraventricular extension. No evidence of acute infarction, hydrocephalus, extra-axial collection or mass lesion/mass effect. Stable mild atrophy and chronic microvascular ischemic changes, within normal limits for age. Vascular: Atherosclerotic vascular calcification of the carotid siphons. No hyperdense vessel. Skull: Normal. Negative for fracture or focal lesion. Sinuses/Orbits: No acute finding. Other: None. IMPRESSION: 1. New small acute intraparenchymal hemorrhage in the right cerebellum. Critical Value/emergent results were called by telephone at the time of interpretation on 01/20/2022 at 2:25 pm to provider [facility], who verbally acknowledged these results. Electronically Signed   By: Titus Dubin M.D.   On: 01/20/2022 14:25   MR BRAIN WO CONTRAST  Result Date: 01/20/2022 CLINICAL DATA:  Initial evaluation for acute dizziness. EXAM: MRI HEAD WITHOUT CONTRAST TECHNIQUE: Multiplanar, multiecho pulse sequences of the brain and surrounding structures were obtained without intravenous contrast. COMPARISON:  Prior CT from earlier the same day. FINDINGS: Brain: Cerebral volume within normal limits for age. Mild chronic microvascular ischemic disease noted involving the periventricular and deep white matter both cerebral hemispheres. Previously identified acute intraparenchymal hemorrhage involving the right cerebellum again seen, grossly stable in size and morphology as compared to previous. No visible underlying lesion on this noncontrast examination. No significant regional mass effect or midline shift. Adjacent fourth ventricle remains  widely patent. No other evidence for acute or chronic intracranial hemorrhage. No other acute large vessel territory infarct. No mass lesion or midline shift. No hydrocephalus or extra-axial fluid collection. Pituitary gland and suprasellar region within normal limits. Midline structures intact. Vascular: Major intracranial vascular flow voids are maintained. Skull and upper cervical spine: Craniocervical junction within normal limits. Bone marrow signal intensity nor heterogeneous but overall within normal limits. No scalp soft tissue abnormality. Sinuses/Orbits: Globes and orbital soft tissues within normal limits. Paranasal sinuses and mastoid air cells are largely clear. Other: None. IMPRESSION: 1. Grossly stable size and morphology of acute hemorrhage involving the right cerebellum. No visible underlying lesion on this noncontrast examination. No significant regional mass effect or midline shift. 2. No other acute intracranial abnormality. 3. Mild chronic microvascular ischemic disease for age. Electronically Signed   By: Jeannine Boga M.D.   On: 01/20/2022 22:17   ECHOCARDIOGRAM COMPLETE  Result Date: 01/22/2022    ECHOCARDIOGRAM REPORT   Patient Name:   [facility] Carmelia Roller Date of Exam: 01/22/2022 Medical Rec #:  [id number]          Height:       62.8 in Accession #:    [id number]         Weight:       127.6 lb Date of Birth:  1931/11/15          BSA:          1.593 m Patient Age:    30 years           BP:           112/68 mmHg Patient Gender: F                  HR:           84 bpm. Exam Location:  Inpatient Procedure: 2D Echo, Color Doppler and Limited Color Doppler Indications:    Stroke  History:        Patient has no prior history of Echocardiogram examinations.  Sonographer:    Joette Catching RCS Referring Phys: [id number] [place] E DE LA [place]  1. Left ventricular ejection fraction, by estimation,  is 60 to 65%. The left ventricle has normal function. The left ventricle has no regional  wall motion abnormalities. Left ventricular diastolic parameters are consistent with Grade I diastolic dysfunction (impaired relaxation).  2. Right ventricular systolic function is normal. The right ventricular size is normal.  3. The mitral valve is normal in structure. Trivial mitral valve regurgitation.  4. The aortic valve is tricuspid. There is mild calcification of the aortic valve. There is mild thickening of the aortic valve. Aortic valve regurgitation is trivial. Aortic valve sclerosis/calcification is present, without any evidence of aortic stenosis.  5. The inferior vena cava is normal in size with greater than 50% respiratory variability, suggesting right atrial pressure of 3 mmHg. Comparison(s): No prior Echocardiogram. Conclusion(s)/Recommendation(s): No intracardiac source of embolism detected on this transthoracic study. Consider a transesophageal echocardiogram to exclude cardiac source of embolism if clinically indicated. FINDINGS  Left Ventricle: Left ventricular ejection fraction, by estimation, is 60 to 65%. The left ventricle has normal function. The left ventricle has no regional wall motion abnormalities. The left ventricular internal cavity size was normal in size. There is  no left ventricular hypertrophy. Left ventricular diastolic parameters are consistent with Grade I diastolic dysfunction (impaired relaxation). Right Ventricle: The right ventricular size is normal. No increase in right ventricular wall thickness. Right ventricular systolic function is normal. Left Atrium: Left atrial size was normal in size. Right Atrium: Right atrial size was normal in size. Pericardium: There is no evidence of pericardial effusion. Mitral Valve: The mitral valve is normal in structure. Trivial mitral valve regurgitation. Tricuspid Valve: The tricuspid valve is normal in structure. Tricuspid valve regurgitation is trivial. Aortic Valve: The aortic valve is tricuspid. There is mild calcification of the  aortic valve. There is mild thickening of the aortic valve. Aortic valve regurgitation is trivial. Aortic valve sclerosis/calcification is present, without any evidence of aortic stenosis. Aortic valve mean gradient measures 6.0 mmHg. Aortic valve peak gradient measures 10.8 mmHg. Aortic valve area, by VTI measures 1.54 cm. Pulmonic Valve: The pulmonic valve was not well visualized. Pulmonic valve regurgitation is trivial. Aorta: The aortic root and ascending aorta are structurally normal, with no evidence of dilitation. Venous: The inferior vena cava is normal in size with greater than 50% respiratory variability, suggesting right atrial pressure of 3 mmHg. IAS/Shunts: The atrial septum is grossly normal.  LEFT VENTRICLE PLAX 2D LVIDd:         5.20 cm   Diastology LVIDs:         3.50 cm   LV e' medial:    4.35 cm/s LV PW:         0.70 cm   LV E/e' medial:  11.2 LV IVS:        0.60 cm   LV e' lateral:   7.18 cm/s LVOT diam:     1.70 cm   LV E/e' lateral: 6.8 LV SV:         47 LV SV Index:   30 LVOT Area:     2.27 cm  RIGHT VENTRICLE             IVC RV Basal diam:  3.90 cm     IVC diam: 1.40 cm RV Mid diam:    3.00 cm RV S prime:     21.90 cm/s TAPSE (M-mode): 2.2 cm LEFT ATRIUM             Index        RIGHT ATRIUM  Index LA diam:        3.10 cm 1.95 cm/m   RA Area:     12.10 cm LA Vol (A2C):   25.1 ml 15.75 ml/m  RA Volume:   26.40 ml  16.57 ml/m LA Vol (A4C):   38.3 ml 24.04 ml/m LA Biplane Vol: 32.0 ml 20.08 ml/m  AORTIC VALVE                     PULMONIC VALVE AV Area (Vmax):    1.44 cm      PV Vmax:       0.84 m/s AV Area (Vmean):   1.45 cm      PV Peak grad:  2.8 mmHg AV Area (VTI):     1.54 cm AV Vmax:           164.00 cm/s AV Vmean:          121.000 cm/s AV VTI:            0.306 m AV Peak Grad:      10.8 mmHg AV Mean Grad:      6.0 mmHg LVOT Vmax:         104.00 cm/s LVOT Vmean:        77.100 cm/s LVOT VTI:          0.208 m LVOT/AV VTI ratio: 0.68  AORTA Ao Root diam: 3.10 cm Ao Asc diam:   3.20 cm MITRAL VALVE               TRICUSPID VALVE MV Area (PHT): 8.25 cm    TR Peak grad:   27.2 mmHg MV Decel Time: 92 msec     TR Vmax:        261.00 cm/s MV E velocity: 48.80 cm/s MV A velocity: 83.60 cm/s  SHUNTS MV E/A ratio:  0.58        Systemic VTI:  0.21 m                            Systemic Diam: 1.70 cm Gwyndolyn Kaufman MD Electronically signed by Gwyndolyn Kaufman MD Signature Date/Time: 01/22/2022/12:41:22 PM    Final     Labs:  Basic Metabolic Panel: Recent Labs  Lab 01/28/22 0541 02/03/22 0541  NA 139 139  K 3.7 3.6  CL 103 102  CO2 30 32  GLUCOSE 94 96  BUN 14 16  CREATININE 0.69 0.81  CALCIUM 9.2 9.1    CBC: Recent Labs  Lab 01/28/22 0541 02/03/22 0541  WBC 9.7 7.9  NEUTROABS 6.5  --   HGB 13.4 12.6  HCT 41.1 38.7  MCV 89.5 89.2  PLT 236 226    CBG: No results for input(s): GLUCAP in the last 168 hours.  Family history.  Negative for hypertension diabetes mellitus prostate cancer or esophageal cancer.  Brief HPI:   Molly Patterson is a 88 y.o. right-handed female with history of osteoarthritis, hypertension, Raynaud's, vertigo who was admitted with a 10-day history of severe dizziness with nausea vomiting.  She was seen in the ED 4/30 and 5/5 at [facility] with negative CT.  She presented to [facility]/20/2022 with ongoing dizziness nausea and inability to eat.  She was reported having ICH about 30 years ago that did not require any intervention.  CTA head showed new small acute IPH in the right cerebellum and mild atrophy with small vessel disease.  CTA head  and neck was negative for LVO and showed severe right P2 stenosis.  Dr. Arnoldo Morale of neurosurgery consulted for input recommended observation no surgical needs.  MRI of the brain revealed stable acute hemorrhage involving right cerebellum without lesion on noncontrast exam.  2D echo ejection fraction 6065% with trivial AVR and MVR.  Neurology felt stroke related accelerated hypertension  she was started on clevidipine.  She continued to be limited by unsteady gait with intermittent dizziness and was admitted for a comprehensive rehab program.   Hospital Course: Molly Patterson was admitted to rehab 01/27/2022 for inpatient therapies to consist of PT, ST and OT at least three hours five days a week. Past admission physiatrist, therapy team and rehab RN have worked together to provide customized collaborative inpatient rehab.  Pertain to patient's right cerebellar ICH she continued to make progressive gains with estimated discharge date 02/04/2021.  She had been cleared for Lovenox for DVT prophylaxis later discontinued ambulating greater than 275 feet.  Blood pressure controlled on monitor on Norvasc as well as Lotensin/HCTZ and would need outpatient follow-up.  Pertaining to her overall dizziness continue to improve she was using Antivert as needed and her nausea also continue to improve maintained on scopolamine patch..  Crestor ongoing for hyperlipidemia.   Blood pressures were monitored on TID basis and controlled and monitored     Rehab course: During patient's stay in rehab weekly team conferences were held to monitor patient's progress, set goals and discuss barriers to discharge. At admission, patient required minimal assist 30 feet rolling walker minimal assist sit to supine minimal assist supine to sit  Physical exam.  Blood pressure 116/61 pulse 82 temperature 97.9 respirations 16 oxygen saturation 97% room air Constitutional.  No acute distress.  Decreased hearing HEENT Head.  Normocephalic and atraumatic Eyes.  Pupils round and reactive to light no discharge without nystagmus Neck.  Supple nontender no JVD without thyromegaly Cardiac regular rate rhythm and extra sounds or murmur heard Abdomen.  Soft nontender positive bowel sounds without rebound Respiratory effort normal no respiratory distress without wheeze Skin.  Intact Neurologic.  Able to provide date,  details leading to admission and biographic information without difficulty.  Follows simple commands Cranial nerves II through XII intact motor strength 5/5 in bilateral deltoid bicep tricep grip hip flexor knee extensors ankle dorsi plantarflexion.  Sensation intact  He/She  has had improvement in activity tolerance, balance, postural control as well as ability to compensate for deficits. He/She has had improvement in functional use RUE/LUE  and RLE/LLE as well as improvement in awareness.  Ambulates extended distances rolling walker close supervision.  Perform alternating toe taps to a 6 inch step 3x10 without upper extremity support minimal assist for balance.  She can stand at the sink to brush her teeth with supervision.  Working with energy conservation techniques.  Completed functional ambulation from chair to bathroom with rolling walker contact-guard for safety.  Completed independent ADL task for collecting laundry around the ADL apartment rolling walker and contact-guard.  Full family teaching completed plan discharged to home       Disposition: Discharged to home   Diet: Regular  Special Instructions: No driving smoking or alcohol  Medications at discharge. 1.  Tylenol as needed 2.  Norvasc 5 mg p.o. daily 3.  Lotensin 10 mg p.o. daily 4.  Vitamin D 1000 units p.o. daily 5.  Hydrochlorothiazide 12.5 mg p.o. daily 6.  Antivert 12.5 mg p.o. 3 times daily as needed 7.  Multivitamin daily  8.  Zofran 4 mg 3 times daily before meals as needed 9.  Protonix 40 mg p.o. nightly 10.  Crestor 10 mg p.o. daily 11.  Scopolamine patch 1.5 mg every 72 hours 12.  Tramadol 50 mg p.o. 4 times daily as needed pain 13.  Vitamin B12 1000 mcg p.o. daily 14.  Ventolin inhaler 2 puffs every 6 hours as needed  30-35 minutes were spent completing discharge summary and discharge planning  Discharge Instructions     Ambulatory referral to Neurology   Complete by: As directed    An appointment is  requested in approximately: 4 weeks right cerebellar [place]   Ambulatory referral to Physical Medicine Rehab   Complete by: As directed    Moderate complexity follow-up 1 to 2 weeks right cerebellar ICH        Follow-up Information     Newman Pies, MD Follow up.   Specialty: Neurosurgery Why: Call for appointment Contact information: [address] [place] [address] [phone]         Charlett Blake, MD Follow up.   Specialty: Physical Medicine and Rehabilitation Why: Office to call for appointment Contact information: [place] [address] [phone]                 Signed: Cathlyn Parsons 02/04/2022, 5:27 AM

## 2022-02-03 NOTE — Progress Notes (Shared)
Occupational Therapy Discharge Summary  Patient Details  Name: Molly Patterson MRN: [id number] Date of Birth: 26-Feb-1932  {CHL IP REHAB OT TIME CALCULATIONS:304400400}   Patient has met {NUMBERS 0-12:18577} of {NUMBERS 0-12:18577} long term goals due to {due AG:5364680}.  Patient to discharge at overall {LOA:3049010} level.  Patient's care partner {care partner:3041650} to provide the necessary {assistance:3041652} assistance at discharge.    Reasons goals not met: ***  Recommendation:  Patient will benefit from ongoing skilled OT services in {setting:3041680} to continue to advance functional skills in the area of {ADL/iADL:3041649}.  Equipment: {equipment:3041657}  Reasons for discharge: {Reason for discharge:3049018}  Patient/family agrees with progress made and goals achieved: {Pt/Family agree with progress/goals:3049020}  OT Discharge Precautions/Restrictions  Restrictions Weight Bearing Restrictions: No General   Vital Signs   Pain Pain Assessment Pain Score: 0-No pain ADL ADL Eating: Independent Where Assessed-Eating: Bed level Grooming: Setup Where Assessed-Grooming: Sitting at sink Upper Body Bathing: Minimal assistance Where Assessed-Upper Body Bathing: Sitting at sink Lower Body Bathing: Minimal assistance Where Assessed-Lower Body Bathing: Sitting at sink Upper Body Dressing: Minimal assistance Lower Body Dressing: Minimal assistance Where Assessed-Lower Body Dressing: Sitting at sink Toileting: Minimal assistance Where Assessed-Toileting: Glass blower/designer: Psychiatric nurse Method: Counselling psychologist: Energy manager: Not assessed Vision Baseline Vision/History: 1 Wears glasses (readers) Patient Visual Report: No change from baseline Vision Assessment?: No apparent visual deficits Additional Comments: wears reading glasses Perception  Perception: Within Functional Limits Praxis Praxis:  Intact Cognition Cognition Overall Cognitive Status: Within Functional Limits for tasks assessed Arousal/Alertness: Awake/alert Orientation Level: Person;Place;Situation Person: Oriented Place: Oriented Situation: Oriented Memory: Appears intact Attention: Focused;Sustained;Selective Focused Attention: Appears intact Sustained Attention: Appears intact Selective Attention: Appears intact Awareness: Appears intact Problem Solving: Appears intact Safety/Judgment: Appears intact Brief Interview for Mental Status (BIMS) Repetition of Three Words (First Attempt): 3 Temporal Orientation: Year: Correct Temporal Orientation: Month: Accurate within 5 days Temporal Orientation: Day: Correct Recall: "Sock": Yes, no cue required Recall: "Blue": Yes, no cue required Recall: "Bed": Yes, after cueing ("a piece of furniture") BIMS Summary Score: 14 Sensation Sensation Light Touch: Appears Intact Hot/Cold: Appears Intact Proprioception: Appears Intact Stereognosis: Not tested Coordination Gross Motor Movements are Fluid and Coordinated: Yes Fine Motor Movements are Fluid and Coordinated: Yes Coordination and Movement Description: GM movements are fluid and coordinated for basic functional mobility tasks. Heel Shin Test: NT 9 Hole Peg Test: RUE 31secs LUE: 32 secs Motor  Motor Motor: Other (comment) Motor - Discharge Observations: motion sensitivity, moves slowly and carefully Mobility  Bed Mobility Bed Mobility: Rolling Right;Rolling Left;Supine to Sit;Sit to Supine Rolling Right: Independent Rolling Left: Independent Supine to Sit: Independent Sit to Supine: Independent Transfers Sit to Stand: Independent with assistive device Stand to Sit: Independent with assistive device  Trunk/Postural Assessment  Cervical Assessment Cervical Assessment: Within Functional Limits Thoracic Assessment Thoracic Assessment: Within Functional Limits Lumbar Assessment Lumbar Assessment: Within  Functional Limits Postural Control Postural Control: Within Functional Limits (limited endurance) Righting Reactions: slower in standing, but adequate with RW  Balance Balance Balance Assessed: Yes Standardized Balance Assessment Standardized Balance Assessment: Berg Balance Test Berg Balance Test Sit to Stand: Able to stand  independently using hands Standing Unsupported: Able to stand safely 2 minutes Sitting with Back Unsupported but Feet Supported on Floor or Stool: Able to sit safely and securely 2 minutes Stand to Sit: Sits safely with minimal use of hands Transfers: Able to transfer safely, minor use of hands Standing Unsupported with Eyes Closed:  Able to stand 10 seconds safely Standing Ubsupported with Feet Together: Able to place feet together independently and stand for 1 minute with supervision From Standing, Reach Forward with Outstretched Arm: Can reach forward >12 cm safely (5") From Standing Position, Pick up Object from Floor: Able to pick up shoe, needs supervision From Standing Position, Turn to Look Behind Over each Shoulder: Looks behind from both sides and weight shifts well Turn 360 Degrees: Able to turn 360 degrees safely but slowly Standing Unsupported, Alternately Place Feet on Step/Stool: Able to complete >2 steps/needs minimal assist Standing Unsupported, One Foot in Front: Needs help to step but can hold 15 seconds Standing on One Leg: Tries to lift leg/unable to hold 3 seconds but remains standing independently Total Score: 41 Extremity/Trunk Assessment RUE Assessment RUE Assessment: Within Functional Limits Passive Range of Motion (PROM) Comments: WFL Active Range of Motion (AROM) Comments: arthritis throughout; [facility] Strength Comments: 4+/5 throughout LUE Assessment LUE Assessment: Within Functional Limits Passive Range of Motion (PROM) Comments: WFL Active Range of Motion (AROM) Comments: arthritis throughout but [facility] General Strength Comments:  4+/5 throughout   Molly Patterson 02/03/2022, 11:09 AM

## 2022-02-04 DIAGNOSIS — I614 Nontraumatic intracerebral hemorrhage in cerebellum: Secondary | ICD-10-CM | POA: Diagnosis not present

## 2022-02-04 MED ORDER — ACETAMINOPHEN 325 MG PO TABS
325.0000 mg | ORAL_TABLET | ORAL | Status: DC | PRN
Start: 2022-02-04 — End: 2023-03-27

## 2022-02-04 MED ORDER — MECLIZINE HCL 12.5 MG PO TABS: 12.5000 mg | ORAL_TABLET | Freq: Three times a day (TID) | ORAL | 0 refills | Status: DC | PRN

## 2022-02-04 MED ORDER — TRAMADOL HCL 50 MG PO TABS: 50.0000 mg | ORAL_TABLET | Freq: Four times a day (QID) | ORAL | 0 refills | Status: DC | PRN

## 2022-02-04 MED ORDER — PANTOPRAZOLE SODIUM 40 MG PO TBEC: 40.0000 mg | DELAYED_RELEASE_TABLET | Freq: Every day | ORAL | 0 refills | Status: DC

## 2022-02-04 MED ORDER — VITAMIN D 25 MCG (1000 UNIT) PO TABS: 1000.0000 [IU] | ORAL_TABLET | Freq: Every day | ORAL | 0 refills | Status: AC

## 2022-02-04 MED ORDER — BENAZEPRIL HCL 10 MG PO TABS: 10.0000 mg | ORAL_TABLET | Freq: Every day | ORAL | 0 refills | Status: DC

## 2022-02-04 MED ORDER — HYDROCHLOROTHIAZIDE 12.5 MG PO TABS
12.5000 mg | ORAL_TABLET | Freq: Every day | ORAL | 0 refills | Status: DC
Start: 2022-02-04 — End: 2022-04-03

## 2022-02-04 MED ORDER — SCOPOLAMINE 1 MG/3DAYS TD PT72: 1.0000 | MEDICATED_PATCH | TRANSDERMAL | 12 refills | Status: DC

## 2022-02-04 MED ORDER — AMLODIPINE BESYLATE 5 MG PO TABS: 5.0000 mg | ORAL_TABLET | Freq: Every day | ORAL | 0 refills | Status: DC

## 2022-02-04 MED ORDER — ONDANSETRON 4 MG PO TBDP: 4.0000 mg | ORAL_TABLET | Freq: Three times a day (TID) | ORAL | 0 refills | Status: DC

## 2022-02-04 MED ORDER — ROSUVASTATIN CALCIUM 10 MG PO TABS: 10.0000 mg | ORAL_TABLET | Freq: Every day | ORAL | 0 refills | Status: DC

## 2022-02-04 MED ORDER — VITAMIN B-12 1000 MCG PO TABS: 1000.0000 ug | ORAL_TABLET | Freq: Every day | ORAL | 0 refills | Status: AC

## 2022-02-04 NOTE — Progress Notes (Signed)
PROGRESS NOTE   Subjective/Complaints:  No new issues , feels ready for d/c  ROS- Denies CP, SOB, -N/V  Objective:   No results found. Recent Labs    02/03/22 0541  WBC 7.9  HGB 12.6  HCT 38.7  PLT 226     Recent Labs    02/03/22 0541  NA 139  K 3.6  CL 102  CO2 32  GLUCOSE 96  BUN 16  CREATININE 0.81  CALCIUM 9.1      Intake/Output Summary (Last 24 hours) at 02/04/2022 0817 Last data filed at 02/03/2022 1806 Gross per 24 hour  Intake 716 ml  Output --  Net 716 ml         Physical Exam: Vital Signs Blood pressure 110/65, pulse (!) 50, temperature 97.9 F (36.6 C), temperature source Oral, resp. rate 18, height '5\' 2"'$  (1.575 m), weight 58.7 kg, SpO2 90 %.   General: No acute distress Mood and affect are appropriate   Musculoskeletal: Full range of motion in all 4 extremities. No joint swelling  Neurologic: Cranial nerves II through XII intact, motor strength is 5/5 in bilateral deltoid, bicep, tricep, grip, hip flexor, knee extensors, ankle dorsiflexor and plantar flexor Sensory exam normal sensation to light touch and proprioception in bilateral upper and lower extremities Cerebellar exam normal finger to nose to finger as well as heel to shin in bilateral upper and lower extremities Musculoskeletal: Full range of motion in all 4 extremities. No joint swelling   Assessment/Plan: 1. Functional deficits  Right  cerebellar ICH Stable for D/C today F/u PCP in 3-4 weeks F/u PM&R 2 weeks See D/C summary See D/C instructions   Care Tool:  Bathing    Body parts bathed by patient: Right arm, Left arm, Chest, Abdomen, Front perineal area, Buttocks, Right upper leg, Left upper leg, Right lower leg, Left lower leg, Face         Bathing assist Assist Level: Independent with assistive device     Upper Body Dressing/Undressing Upper body dressing   What is the patient wearing?: Pull over  shirt    Upper body assist Assist Level: Independent with assistive device    Lower Body Dressing/Undressing Lower body dressing      What is the patient wearing?: Pants, Underwear/pull up     Lower body assist Assist for lower body dressing: Independent with assitive device     Toileting Toileting    Toileting assist Assist for toileting: Independent with assistive device     Transfers Chair/bed transfer  Transfers assist     Chair/bed transfer assist level: Independent with assistive device Chair/bed transfer assistive device: Museum/gallery exhibitions officer assist      Assist level: Supervision/Verbal cueing Assistive device: Walker-rolling Max distance: 150   Walk 10 feet activity   Assist     Assist level: Supervision/Verbal cueing Assistive device: Walker-rolling   Walk 50 feet activity   Assist    Assist level: Supervision/Verbal cueing Assistive device: Walker-rolling    Walk 150 feet activity   Assist    Assist level: Supervision/Verbal cueing Assistive device: Walker-rolling    Walk 10 feet on uneven surface  activity   Assist     Assist level: Supervision/Verbal cueing Assistive device: Walker-rolling   Wheelchair     Assist Is the patient using a wheelchair?: No             Wheelchair 50 feet with 2 turns activity    Assist            Wheelchair 150 feet activity     Assist          Blood pressure 110/65, pulse (!) 50, temperature 97.9 F (36.6 C), temperature source Oral, resp. rate 18, height '5\' 2"'$  (1.575 m), weight 58.7 kg, SpO2 90 %.  Medical Problem List and Plan: 1. Functional deficits secondary to Right cerebellar ICH- no sig ataxia, has poor endurance              -no driving until MD re eval as OP              -ELOS/Goals: 5/23  2.  Impaired mobility, ambulating 275 feet: d/c Lovenox             -antiplatelet therapy: N/A due to bleed 3. Pain: continue tylenol  prn.  4. Mood: LCSW to follow for evaluation and support.              -antipsychotic agents: N/A 5. Neuropsych: This patient is capable of making decisions on her own behalf. No apparent cognitive deficits 6. Skin/Wound Care: Routine pressure relief measures.  7. Fluids/Electrolytes/Nutrition: Monitor I/O. BMET nl  8. HTN: Monitor BP TID. Stable on home dose meds --continue HCTZ, Amlodipine and Benazepril  Decrease amlodipine to '5mg'$    Vitals:   02/03/22 1942 02/04/22 0324  BP: 106/62 110/65  Pulse: 83 (!) 50  Resp: 18 18  Temp: 98.2 F (36.8 C) 97.9 F (36.6 C)  SpO2: 94% 90%   Orthostatic vitals normal this am - feel dizziness is CVA related , no orthostasis    BP controlled 5/23 9. Hyper lipidemia: On Statin (but does not plan on continuing it at discharge)             --continue to educate on compliance.  10. Leucocytosis: Monitor for fevers and other signs of infection 11. Vestibular symptoms: changed compazine ot zofran to reduce sedation - will schedule  --Has an episode last night that woke her from sleep/needed meds             --vestibular eval and treat.   -meclizine scheduled.  12. Nausea: scopolamine patch ordered      LOS: 8 days A FACE TO FACE EVALUATION WAS PERFORMED  Charlett Blake 02/04/2022, 8:17 AM

## 2022-02-13 ENCOUNTER — Ambulatory Visit: Payer: Medicare Other

## 2022-02-14 ENCOUNTER — Encounter: Payer: Medicare Other | Attending: Registered Nurse | Admitting: Registered Nurse

## 2022-02-14 ENCOUNTER — Encounter: Payer: Self-pay | Admitting: Registered Nurse

## 2022-02-14 VITALS — BP 112/69 | HR 86 | Ht 62.0 in

## 2022-02-14 DIAGNOSIS — I614 Nontraumatic intracerebral hemorrhage in cerebellum: Secondary | ICD-10-CM | POA: Insufficient documentation

## 2022-02-14 DIAGNOSIS — I10 Essential (primary) hypertension: Secondary | ICD-10-CM | POA: Diagnosis not present

## 2022-02-14 MED ORDER — ONDANSETRON 4 MG PO TBDP: 4.0000 mg | ORAL_TABLET | Freq: Three times a day (TID) | ORAL | 0 refills | Status: DC

## 2022-02-14 NOTE — Progress Notes (Signed)
Subjective:    Patient ID: Molly Patterson, female    DOB: 02-05-1932, 88 y.o.   MRN: [id number]  HPI: Molly Patterson is a 88 y.o. female who is here for [place] appointment for follow up of her Cerebellar Bleed and Essential Hypertension.  She presented to [facility] on 01/20/2022, with ongoing dizziness, nausea and inability to eat.  Dr Francia Greaves H&P on 01/20/2022 Molly Patterson is a 88 y.o. female.   88 year old female with prior medical history as detailed below presents for evaluation.  Patient reports she has had nearly 10 days of persistent vertigo and dizziness.  Patient reports that with any movement of her head she has significant vertigo with associated nausea and vomiting.   Patient reports that she was seen for same at [facility] on Friday and Sunday and treated.  Apparently she was diagnosed with severe vertigo these prior evaluations.  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  Approximately 10 days ago she developed severe dizziness/room spinning with nausea and vomiting and symptoms have persisted since that time.  She presented to [facility] on 2 separate occasions within the past week on April 30 and May 5.  We do not have actual images available from [facility] but a head CT on that first ED visit was interpreted as no acute intracranial process.  She did not have MRI imaging.  CT head performed at [facility] today shows a small right cerebellar intraparenchymal hemorrhage.  Patient states that she was told that she had some kind of venous anomaly which did bleed in the 1980s or 90s but she did not have to have surgery at that time and does not have any further specific information about this.  Her exam is nonfocal although she has persistent dizziness and nausea.  He has not been able to really eat or drink for the past several days due to nausea.   She is not on anticoagulation.  No history of trauma.   CT Head:  IMPRESSION: 1. New small acute intraparenchymal hemorrhage in the right cerebellum.  CTA: IMPRESSION: 1. No large vessel occlusion, aneurysm, or vascular malformation. 2. Intracranial atherosclerosis with a severe right P2 stenosis. 3. Widely patent cervical carotid and vertebral arteries. 4. Aortic Atherosclerosis (ICD10-I70.0).   MR Brain:  IMPRESSION: 1. Grossly stable size and morphology of acute hemorrhage involving the right cerebellum. No visible underlying lesion on this noncontrast examination. No significant regional mass effect or midline shift. 2. No other acute intracranial abnormality. 3. Mild chronic microvascular ischemic disease for age.   Neurosurgery and Neurology was consulted.   Molly Patterson was admitted to inpatient rehabilitation on 01/27/2022 and discharged home on 02/04/2022. She will begin Outpatient Therapy at [facility] on Wednesday , she reports.  She denies any pain. She rates her pain 0. She states at [facility] she has her usual joint pain. She also reports she has a good appetite.   She walks at home with walker, she arrived in wheelchair today.    Pain Inventory Average Pain 0 Pain Right Now 0 My pain is aching and only in the evening  LOCATION OF PAIN  hip, knee, leg, ankle  BOWEL Number of stools per week: 6-8 Oral laxative use Yes  Type of laxative Miralax   BLADDER Normal    Mobility walk with assistance  Function retired  Neuro/Psych dizziness  Prior Studies Any changes since last  visit?  no  Physicians involved in your care Any changes since last visit?  no   Family History  Problem Relation Age of Onset   Arthritis Neg Hx        family   Hypertension Neg Hx        family   Diabetes Neg Hx        family   Prostate cancer Neg Hx        family   Social History   Socioeconomic History   Marital status: Married    Spouse name: Not on file   Number  of children: Not on file   Years of education: Not on file   Highest education level: Not on file  Occupational History   Not on file  Tobacco Use   Smoking status: Never   Smokeless tobacco: Never  Vaping Use   Vaping Use: Never used  Substance and Sexual Activity   Alcohol use: No   Drug use: Never   Sexual activity: Not on file  Other Topics Concern   Not on file  Social History Narrative   Not on file   Social Determinants of Health   Financial Resource Strain: Not on file  Food Insecurity: Not on file  Transportation Needs: Not on file  Physical Activity: Not on file  Stress: Not on file  Social Connections: Not on file   Past Surgical History:  Procedure Laterality Date   APPENDECTOMY     TONSILLECTOMY     Past Medical History:  Diagnosis Date   Allergy    hayfever   ANEMIA-IRON DEFICIENCY 08/02/2009   Basal cell carcinoma (BCC) of right forearm    BURSITIS, HIP 08/02/2009   COVID-19    HYPERTENSION 08/02/2009   OSTEOARTHRITIS, GENERALIZED 08/12/2010   Raynaud's syndrome 08/12/2010   Transfusion history 09/15/1962   Vertigo    BP 112/69   Pulse 86   Ht '5\' 2"'$  (1.575 m)   SpO2 97%   BMI 23.67 kg/m   Opioid Risk Score:   Fall Risk Score:  `1  Depression screen PHQ 2/9     02/14/2022    2:20 PM 07/27/2017   11:36 AM 07/25/2015    2:50 PM 07/21/2014   10:38 AM 07/04/2013    2:55 PM  Depression screen PHQ 2/9  Decreased Interest 0 0 0 0 0  Down, Depressed, Hopeless 0 0 0 0 0  PHQ - 2 Score 0 0 0 0 0  Altered sleeping 2      Tired, decreased energy 1      Change in appetite 1      Feeling bad or failure about yourself  0      Trouble concentrating 0      Moving slowly or fidgety/restless 0      Suicidal thoughts 0      PHQ-9 Score 4      Difficult doing work/chores Somewhat difficult         Review of Systems  Constitutional: Negative.   HENT: Negative.    Eyes: Negative.   Respiratory: Negative.    Cardiovascular: Negative.    Gastrointestinal:  Positive for nausea.  Endocrine: Negative.   Genitourinary: Negative.   Musculoskeletal:  Positive for gait problem.  Skin: Negative.   Allergic/Immunologic: Negative.   Neurological:  Positive for dizziness.  Hematological: Negative.   Psychiatric/Behavioral: Negative.        Objective:   Physical Exam Vitals and nursing note reviewed.  Constitutional:  Appearance: Normal appearance.  Cardiovascular:     Rate and Rhythm: Normal rate and regular rhythm.     Pulses: Normal pulses.     Heart sounds: Normal heart sounds.  Pulmonary:     Effort: Pulmonary effort is normal.     Breath sounds: Normal breath sounds.  Musculoskeletal:     Cervical back: Normal range of motion and neck supple.     Comments: Normal Muscle Bulk and Muscle Testing Reveals:  Upper Extremities: Full ROM and Muscle Strength 5/5 Lower Extremities: Full ROM and Muscle Strength 5/5 Arrived in wheelchair     Skin:    General: Skin is warm and dry.  Neurological:     Mental Status: She is alert and oriented to person, place, and time.  Psychiatric:        Mood and Affect: Mood normal.        Behavior: Behavior normal.         Assessment & Plan:  Cerebellar Bleed: She will call Dr Arnoldo Morale office to schedule HFU appointment. She was instructed to call Neurology to schedule HFU appointment, she verbalizes understanding. She asked this provider to speak with Dr Letta Pate to see if she needs to follow up with Neurosurgery nd neurology. This provider will speak with Dr Letta Pate and giver her a call. She verbalizes understanding.   Essential Hypertension. Continue current medication regimen. PCP following. Continue to Monitor.   F/U with Dr Letta Pate in 4-6 weeks

## 2022-02-14 NOTE — Patient Instructions (Signed)
Call Dr Elease Hashimoto: To schedule Hospital Follow Up Appointment   Call Dr Arnoldo Morale ( Neuro-Surgery) [phone] [address]  [place]  [place] , [place] [address]   Office to [facility] Follow Up Appointment

## 2022-02-18 ENCOUNTER — Telehealth: Payer: Self-pay | Admitting: Registered Nurse

## 2022-02-18 ENCOUNTER — Encounter: Payer: Self-pay | Admitting: Family Medicine

## 2022-02-18 ENCOUNTER — Ambulatory Visit (INDEPENDENT_AMBULATORY_CARE_PROVIDER_SITE_OTHER): Payer: Medicare Other | Admitting: Family Medicine

## 2022-02-18 VITALS — BP 130/70 | HR 78 | Temp 98.2°F | Ht 62.0 in | Wt 119.6 lb

## 2022-02-18 DIAGNOSIS — R42 Dizziness and giddiness: Secondary | ICD-10-CM

## 2022-02-18 DIAGNOSIS — I614 Nontraumatic intracerebral hemorrhage in cerebellum: Secondary | ICD-10-CM

## 2022-02-18 DIAGNOSIS — I10 Essential (primary) hypertension: Secondary | ICD-10-CM

## 2022-02-18 MED ORDER — ONDANSETRON 8 MG PO TBDP
8.0000 mg | ORAL_TABLET | Freq: Three times a day (TID) | ORAL | 1 refills | Status: DC | PRN
Start: 2022-02-18 — End: 2022-07-29

## 2022-02-18 NOTE — Patient Instructions (Signed)
Taper gradually off the Zofran and use only as needed  Follow up immediately for any severe vertigo or other new symptoms.

## 2022-02-18 NOTE — Progress Notes (Signed)
Established Patient Office Visit  Subjective   Patient ID: Molly Patterson, female    DOB: August 27, 1932  Age: 88 y.o. MRN: [id number]  Chief Complaint  Patient presents with   Hospitalization Follow-up    HPI   Molly Patterson is seen for hospital and rehab follow-up.  She has history of osteoarthritis, hypertension, Raynaud's, recurrent vertigo.  She been having some progressive nausea and vomiting and vertigo symptoms and had been seen twice at ER in [facility] with negative CT.  Presented to [facility] on 8 May with ongoing symptoms which were progressive.  CT angiogram of the head and neck showed new small acute intraparenchymal hemorrhage in the right cerebellum.  Neurosurgery was consulted without need for intervention.  MRI brain revealed stable acute hemorrhage right cerebellum without any lesions.  Echocardiogram EF 60 to 65% with trivial aortic valve and mitral valve regurg.  She was transferred to rehab on the 15th and stayed there through the 23rd.  She had PT, OT, and speech therapy.  Blood pressure and rehab remain well controlled on combination therapy with benazepril, amlodipine, HCTZ which is her outpatient regimen.  Blood pressure remains well controlled at this time.  Still has occasional nausea and still using ondansetron as needed.  She did not get much relief with the 4 mg dose but did with the 8 mg.  She also has scopolamine patch behind the ear.  They had recommended that she start Crestor during hospitalization but she refuses at this time.  She feels like she is making progress.  She was also discharged on Protonix but denies any reflux symptoms at this time.  She takes tramadol as needed for osteoarthritis pain.  She does have outpatient PT set up and will be evaluated tomorrow.  Past Medical History:  Diagnosis Date   Allergy    hayfever   ANEMIA-IRON DEFICIENCY 08/02/2009   Basal cell carcinoma (BCC) of right forearm    BURSITIS, HIP 08/02/2009   COVID-19     HYPERTENSION 08/02/2009   OSTEOARTHRITIS, GENERALIZED 08/12/2010   Raynaud's syndrome 08/12/2010   Transfusion history 09/15/1962   Vertigo    Past Surgical History:  Procedure Laterality Date   APPENDECTOMY     TONSILLECTOMY      reports that she has never smoked. She has never used smokeless tobacco. She reports that she does not drink alcohol and does not use drugs. family history is not on file. Allergies  Allergen Reactions   Penicillins Shortness Of Breath and Swelling    REACTION: hives   Lidocaine-Epinephrine     REACTION: pulse rapic   Rofecoxib     Vioxx, itiching    Review of Systems  Constitutional:  Negative for chills and fever.  Respiratory:  Negative for shortness of breath.   Cardiovascular:  Negative for chest pain.  Genitourinary:  Negative for dysuria.  Neurological:  Positive for dizziness. Negative for speech change, focal weakness and headaches.     Objective:     BP 130/70 (BP Location: Left Arm, Patient Position: Sitting, Cuff Size: Normal)   Pulse 78   Temp 98.2 F (36.8 C) (Oral)   Ht '5\' 2"'$  (1.575 m)   Wt 119 lb 9.6 oz (54.3 kg)   SpO2 99%   BMI 21.88 kg/m  BP Readings from Last 3 Encounters:  02/18/22 130/70  02/14/22 112/69  02/04/22 110/65   Wt Readings from Last 3 Encounters:  02/18/22 119 lb 9.6 oz (54.3 kg)  02/03/22 129 lb 6.6  oz (58.7 kg)  01/20/22 127 lb 10.3 oz (57.9 kg)      Physical Exam Vitals reviewed.  Constitutional:      Appearance: Normal appearance.  HENT:     Head: Normocephalic and atraumatic.  Cardiovascular:     Rate and Rhythm: Normal rate and regular rhythm.  Pulmonary:     Effort: Pulmonary effort is normal.     Breath sounds: Normal breath sounds.  Neurological:     General: No focal deficit present.     Mental Status: She is alert.     Cranial Nerves: No cranial nerve deficit.     Comments: No focal weakness.  Ambulating with walker.  Normal finger-to-nose testing.  Good grip strength  bilaterally.     No results found for any visits on 02/18/22.    The ASCVD Risk score (Arnett DK, et al., 2019) failed to calculate for the following reasons:   The 2019 ASCVD risk score is only valid for ages 21 to 53    Assessment & Plan:   #1 recent intraparenchymal cerebellar hemorrhage.  Patient does not take any antiplatelet or anticoagulant medications.  Neurologically stable at this time.  Progressing following rehab stay.  She has outpatient PT set up as above. -Consider follow-up MRI without contrast in 2 to 3 months to rule out underlying structural or vascular lesion. -Set up 28-monthfollow-up  #2 intermittent vertigo symptoms.  Overall improving.  Still has intermittent nausea.  Requesting refill of Zofran.  This is refilled.  Taper off scopolamine patch.  #3 hypertension controlled currently with combination therapy of amlodipine, benazepril, and HCTZ.  Recent electrolytes stable.  Continue to watch sodium intake.  Return in about 3 months (around 05/21/2022).    BCarolann Littler MD

## 2022-02-18 NOTE — Telephone Encounter (Signed)
This provider spoke with Dr Letta Pate.  Dr Letta Pate stated for Molly Patterson to F/U with Molly Patterson and [facility].  This provider gave the phone numbers to Molly Patterson and Molly Patterson verbalizes understanding.

## 2022-02-19 ENCOUNTER — Ambulatory Visit: Payer: Medicare Other | Attending: Physician Assistant

## 2022-02-19 DIAGNOSIS — R2681 Unsteadiness on feet: Secondary | ICD-10-CM | POA: Diagnosis not present

## 2022-02-19 DIAGNOSIS — I614 Nontraumatic intracerebral hemorrhage in cerebellum: Secondary | ICD-10-CM | POA: Insufficient documentation

## 2022-02-19 DIAGNOSIS — M6281 Muscle weakness (generalized): Secondary | ICD-10-CM | POA: Insufficient documentation

## 2022-02-19 NOTE — Therapy (Signed)
[facility] [place], [place], [address] Phone: [phone]   Fax:  [phone]  Physical Therapy Evaluation  Patient Details  Name: Molly Patterson MRN: [id number] Date of Birth: 29-May-1932 Referring Provider (PT): Donia Guiles, [place]   Encounter Date: 02/19/2022   PT End of Session - 02/19/22 1356     Visit Number 1    Number of Visits 12    Date for PT Re-Evaluation 05/09/22    PT Start Time 2119    PT Stop Time 1443    PT Time Calculation (min) 46 min    Activity Tolerance Patient tolerated treatment well    Behavior During Therapy [facility] for tasks assessed/performed             Past Medical History:  Diagnosis Date   Allergy    hayfever   ANEMIA-IRON DEFICIENCY 08/02/2009   Basal cell carcinoma (BCC) of right forearm    BURSITIS, HIP 08/02/2009   COVID-19    HYPERTENSION 08/02/2009   OSTEOARTHRITIS, GENERALIZED 08/12/2010   Raynaud's syndrome 08/12/2010   Transfusion history 09/15/1962   Vertigo     Past Surgical History:  Procedure Laterality Date   APPENDECTOMY     TONSILLECTOMY      There were no vitals filed for this visit.    Subjective Assessment - 02/19/22 1356     Subjective Patient reports that she began to have dizziness and nausea around the second week of May. She experienced this multiple times and went to the emergency department at [facility]. She eventually went to [facility] and was admitted to the ICU and was in the hospital for about 2.5 weeks. She notes that her dizziness and nausea has gotten better with medication. She notes that she had to take her medication earlier today just in case. She notes that she still gets dizzy some, but not as often and it does not have to occur with any movement. She typically uses her walker to get around new places, but she will keep it close by.    Pertinent History hard of hearing, history of COVID-19, OA    Limitations Walking;Standing     Patient Stated Goals improved balance    Currently in Pain? No/denies                [facility] PT Assessment - 02/19/22 0001       Assessment   Medical Diagnosis Cerebellar bleed    Referring Provider (PT) Boyd, PA-C    Onset Date/Surgical Date 01/12/22    Next MD Visit 04/04/22    Prior Therapy Yes, inpatient rehab      Precautions   Precautions Fall      Restrictions   Weight Bearing Restrictions No      Balance Screen   Has the patient fallen in the past 6 months No    Has the patient had a decrease in activity level because of a fear of falling?  No    Is the patient reluctant to leave their home because of a fear of falling?  No      Home Environment   Living Environment Private residence    Living Arrangements Spouse/significant other    Home Access Stairs to enter    Entrance Stairs-Number of Steps 7    Entrance Stairs-Rails Can reach both    Home Layout Multi-level    Alternate Level Stairs-Number of Steps 14    Alternate Level Stairs-Rails Can reach both  Prior Function   Level of Independence Independent    Leisure gardening      Cognition   Overall Cognitive Status Within Functional Limits for tasks assessed    Memory Appears intact    Awareness Appears intact    Problem Solving Appears intact      Sensation   Additional Comments Patient reports no numbness or tingling      Coordination   Gross Motor Movements are Fluid and Coordinated Yes    Fine Motor Movements are Fluid and Coordinated Yes    Finger Nose Finger Test [facility]    Heel Shin Test [facility]      ROM / Strength   AROM / PROM / Strength Strength      Strength   Strength Assessment Site Hip;Ankle;Knee    Right/Left Hip Left;Right    Right Hip Flexion 4/5    Left Hip Flexion 4-/5    Right/Left Knee Left;Right    Right Knee Flexion 4+/5    Right Knee Extension 5/5    Left Knee Flexion 4+/5    Left Knee Extension 5/5    Right/Left Ankle Right;Left    Right Ankle Dorsiflexion 4-/5     Left Ankle Dorsiflexion 4-/5      Special Tests   Other special tests No nystagmus or abnormal eye tracking with H-test      Transfers   Transfers Sit to Stand;Stand to Sit    Sit to Stand 6: Modified independent (Device/Increase time);Without upper extremity assist    Five time sit to stand comments  23 seconds without UE support    Stand to Sit 6: Modified independent (Device/Increase time);Without upper extremity assist      Ambulation/Gait   Ambulation/Gait Yes    Ambulation/Gait Assistance 6: Modified independent (Device/Increase time)    Gait velocity 0.98 m/s   w/o AD: 8.36 seconds to walk 27 feet   Gait Comments Able to ambulate without AD, but exhibited reduced stability and a drift to the left, but this improved after approximately 100 feet      Balance   Balance Assessed Yes      Static Standing Balance   Static Standing Balance -  Activities  Romberg - Eyes Opened;Romberg - Eyes Closed;Tandam Stance - Right Leg;Tandam Stance - Left Leg    Static Standing - Comment/# of Minutes Romberg: (EO: 30 seconds) (EC: 11 seconds) Tandem: 3 seconds each                        Objective measurements completed on examination: See above findings.                     PT Long Term Goals - 02/19/22 1538       PT LONG TERM GOAL #1   Title Patient will be independent with her HEP.    Time 6    Period Weeks    Status New    Target Date 04/02/22      PT LONG TERM GOAL #2   Title Patient will improve her five time sit to stand time to 14 seconds or less for improved safety and lower extremity power.    Time 6    Period Weeks    Status New    Target Date 04/02/22      PT LONG TERM GOAL #3   Title Patient will be able to demonstrate a gait speed of at least 1.2 m/s for improved community ambulation.  Time 6    Period Weeks    Status New    Target Date 04/02/22      PT LONG TERM GOAL #4   Title Patient will report feeling comfortable  completing her daily activities without being limited by dizziness or instability.    Time 6    Period Weeks    Status New    Target Date 04/02/22                    Plan - 02/19/22 1526     Clinical Impression Statement Patient is a 88 year old female presenting to physical therapy with instability following a cerebellar bleed which resulted in her hospitalization from 5/15-5/23. She exhibited reduced lower extremity power and stability as evidenced by her five time sit to stand and balance testing. She was able to walk short distances without an assistive device, but she exhibited a drift to the left while walking initially. However, this improved slightly as she continued walking. She did not exhibit or report any dizziness or nausea with any of today's assessments. Recommend that she continue with skilled physical therapy to address her remaining impairments to return to her prior level of function.    Personal Factors and Comorbidities Comorbidity 2    Comorbidities HTN, OA    Examination-Activity Limitations Locomotion Level;Transfers    Examination-Participation Restrictions Cleaning;Community Activity;Shop;Yard Work    [employer]chant [employer] officer Evolving/Moderate complexity    Clinical Decision Making Moderate    Rehab Potential Good    PT Frequency 2x / week    PT Duration 6 weeks    PT Treatment/Interventions ADLs/Self Care Home Management;Neuromuscular re-education;Balance training;Therapeutic exercise;Therapeutic activities;Functional mobility training;Gait training;Patient/family education    PT Next Visit Plan nustep, lower extremity strengthening and balance interventions    Consulted and Agree with Plan of Care Patient             Patient will benefit from skilled therapeutic intervention in order to improve the following deficits and impairments:  Difficulty walking, Dizziness, Decreased activity tolerance, Decreased balance, Decreased mobility,  Decreased strength  Visit Diagnosis: Unsteadiness on feet  Muscle weakness (generalized)     Problem List Patient Active Problem List   Diagnosis Date Noted   Cerebellar bleed ([place]) 01/27/2022   ICH (intracerebral hemorrhage) ([place]) 01/20/2022   RAYNAUD'S SYNDROME 08/12/2010   Osteoarthritis, multiple sites 08/12/2010   ANEMIA-IRON DEFICIENCY 08/02/2009   Essential hypertension 08/02/2009   BURSITIS, HIP 08/02/2009   Rationale for Evaluation and Treatment Rehabilitation   Darlin Coco, PT 02/19/2022, 3:47 PM  [facility] [address] [place], [place], [address] Phone: [phone]   Fax:  [phone]  Name: Molly Patterson MRN: [id number] Date of Birth: 02/18/1932

## 2022-02-21 ENCOUNTER — Ambulatory Visit: Payer: Medicare Other

## 2022-02-21 DIAGNOSIS — R2681 Unsteadiness on feet: Secondary | ICD-10-CM

## 2022-02-21 DIAGNOSIS — I614 Nontraumatic intracerebral hemorrhage in cerebellum: Secondary | ICD-10-CM

## 2022-02-21 DIAGNOSIS — M6281 Muscle weakness (generalized): Secondary | ICD-10-CM | POA: Diagnosis not present

## 2022-02-21 NOTE — Therapy (Signed)
[facility] [place], [place], [address] Phone: [phone]   Fax:  [phone]  Physical Therapy Treatment  Patient Details  Name: Molly Patterson MRN: [id number] Date of Birth: 1932-06-19 Referring Provider (PT): Donia Guiles, [place]   Encounter Date: 02/21/2022   PT End of Session - 02/21/22 0950     Visit Number 2    Number of Visits 12    Date for PT Re-Evaluation 05/09/22    PT Start Time 0945    PT Stop Time 1017    PT Time Calculation (min) 32 min    Activity Tolerance Patient tolerated treatment well    Behavior During Therapy [facility] for tasks assessed/performed             Past Medical History:  Diagnosis Date   Allergy    hayfever   ANEMIA-IRON DEFICIENCY 08/02/2009   Basal cell carcinoma (BCC) of right forearm    BURSITIS, HIP 08/02/2009   COVID-19    HYPERTENSION 08/02/2009   OSTEOARTHRITIS, GENERALIZED 08/12/2010   Raynaud's syndrome 08/12/2010   Transfusion history 09/15/1962   Vertigo     Past Surgical History:  Procedure Laterality Date   APPENDECTOMY     TONSILLECTOMY      There were no vitals filed for this visit.   Subjective Assessment - 02/21/22 0949     Subjective Pt arrives for today's treatment session denying any pain, but endorse nausea this morning.  Arriving to treatment with crackers and emesis bag.    Pertinent History hard of hearing, history of COVID-19, OA    Limitations Walking;Standing    Patient Stated Goals improved balance    Currently in Pain? No/denies                               [facility] Adult PT Treatment/Exercise - 02/21/22 0001       Exercises   Exercises Knee/Hip;Shoulder      Knee/Hip Exercises: Seated   Long Arc Quad Both;20 reps;Weights    Long Arc Quad Weight 2 lbs.    Ball Squeeze 2 mins    Clamshell with TheraBand Red   2 mins   Other Seated Knee/Hip Exercises Heel/Toe Raises x 20 reps; 2#    Marching Both;20 reps;Weights     Marching Weights 2 lbs.    Hamstring Curl Both;20 reps;Weights    Hamstring Limitations red tband    Sit to Sand 2 sets;10 reps      Shoulder Exercises: Seated   Extension Both;20 reps;Theraband    Theraband Level (Shoulder Extension) Level 2 (Red)    Row Both;20 reps;Theraband    Theraband Level (Shoulder Row) Level 2 (Red)    Horizontal ABduction Both;20 reps;Theraband    Theraband Level (Shoulder Horizontal ABduction) Level 2 (Red)    Other Seated Exercises Scap Retraction x 20 reps                          PT Long Term Goals - 02/19/22 1538       PT LONG TERM GOAL #1   Title Patient will be independent with her HEP.    Time 6    Period Weeks    Status New    Target Date 04/02/22      PT LONG TERM GOAL #2   Title Patient will improve her five time sit to stand time to 14 seconds or less for  improved safety and lower extremity power.    Time 6    Period Weeks    Status New    Target Date 04/02/22      PT LONG TERM GOAL #3   Title Patient will be able to demonstrate a gait speed of at least 1.2 m/s for improved community ambulation.    Time 6    Period Weeks    Status New    Target Date 04/02/22      PT LONG TERM GOAL #4   Title Patient will report feeling comfortable completing her daily activities without being limited by dizziness or instability.    Time 6    Period Weeks    Status New    Target Date 04/02/22                   Plan - 02/21/22 0950     Clinical Impression Statement Pt arrives for today's treatment session denying any pain, but does report some nausea.  Due to pt's nausea, shorter treatment utilized in order to not upset her system further.  Pt instructed in seated LE exercises to increase strength and function.  Pt requiring min cues for proper technique and pacing with all newly added exercises.  Pt introduced to seated shoulder exercises for postural stability.  Pt requiring cues for eccentric control with all  resisted exercises.  Pt denied any pain or increased nausea at completion of today's treatment session, but does endorse increased fatigue.    Personal Factors and Comorbidities Comorbidity 2    Comorbidities HTN, OA    Examination-Activity Limitations Locomotion Level;Transfers    Examination-Participation Restrictions Cleaning;Community Activity;Shop;Yard Work    [employer]chant [employer] officer Evolving/Moderate complexity    Rehab Potential Good    PT Frequency 2x / week    PT Duration 6 weeks    PT Treatment/Interventions ADLs/Self Care Home Management;Neuromuscular re-education;Balance training;Therapeutic exercise;Therapeutic activities;Functional mobility training;Gait training;Patient/family education    PT Next Visit Plan nustep, lower extremity strengthening and balance interventions    Consulted and Agree with Plan of Care Patient             Patient will benefit from skilled therapeutic intervention in order to improve the following deficits and impairments:  Difficulty walking, Dizziness, Decreased activity tolerance, Decreased balance, Decreased mobility, Decreased strength  Visit Diagnosis: Unsteadiness on feet  Muscle weakness (generalized)  Cerebellar bleed [facility])     Problem List Patient Active Problem List   Diagnosis Date Noted   Cerebellar bleed ([place]) 01/27/2022   ICH (intracerebral hemorrhage) ([place]) 01/20/2022   RAYNAUD'S SYNDROME 08/12/2010   Osteoarthritis, multiple sites 08/12/2010   ANEMIA-IRON DEFICIENCY 08/02/2009   Essential hypertension 08/02/2009   BURSITIS, HIP 08/02/2009   Rationale for Evaluation and Treatment Rehabilitation  Kathrynn Ducking, PTA 02/21/2022, 10:30 AM  [facility] [address] [place], [place], [address] Phone: [phone]   Fax:  [phone]  Name: Molly Patterson MRN: [id number] Date of Birth: 1932/08/31

## 2022-02-24 ENCOUNTER — Ambulatory Visit: Payer: Medicare Other

## 2022-02-24 DIAGNOSIS — M6281 Muscle weakness (generalized): Secondary | ICD-10-CM | POA: Diagnosis not present

## 2022-02-24 DIAGNOSIS — R2681 Unsteadiness on feet: Secondary | ICD-10-CM | POA: Diagnosis not present

## 2022-02-24 DIAGNOSIS — I614 Nontraumatic intracerebral hemorrhage in cerebellum: Secondary | ICD-10-CM | POA: Diagnosis not present

## 2022-02-24 NOTE — Therapy (Signed)
[facility] [place], [place], [address] Phone: [phone]   Fax:  [phone]  Physical Therapy Treatment  Patient Details  Name: Molly Patterson MRN: [id number] Date of Birth: December 24, 1931 Referring Provider (PT): Donia Guiles, [place]   Encounter Date: 02/24/2022   PT End of Session - 02/24/22 1043     Visit Number 3    Number of Visits 12    Date for PT Re-Evaluation 05/09/22    PT Start Time 1030    PT Stop Time 1111    PT Time Calculation (min) 41 min    Activity Tolerance Patient tolerated treatment well    Behavior During Therapy [facility] for tasks assessed/performed             Past Medical History:  Diagnosis Date   Allergy    hayfever   ANEMIA-IRON DEFICIENCY 08/02/2009   Basal cell carcinoma (BCC) of right forearm    BURSITIS, HIP 08/02/2009   COVID-19    HYPERTENSION 08/02/2009   OSTEOARTHRITIS, GENERALIZED 08/12/2010   Raynaud's syndrome 08/12/2010   Transfusion history 09/15/1962   Vertigo     Past Surgical History:  Procedure Laterality Date   APPENDECTOMY     TONSILLECTOMY      There were no vitals filed for this visit.   Subjective Assessment - 02/24/22 1042     Subjective Pt arrives for today's treatment session denying any pain, but endorse nausea this morning.  Arriving to treatment with crackers and emesis bag.  Pt reports taking her nausea and "dizzy" medicine prior to today's treatment session.    Pertinent History hard of hearing, history of COVID-19, OA    Limitations Walking;Standing    Patient Stated Goals improved balance    Currently in Pain? No/denies                               [facility] Adult PT Treatment/Exercise - 02/24/22 0001       Knee/Hip Exercises: Aerobic   Nustep Lvl 2 x 12 mins      Knee/Hip Exercises: Seated   Long Arc Quad Both;20 reps;Weights    Long Arc Quad Weight 3 lbs.    Ball Squeeze 2 mins    Clamshell with TheraBand Red   2 mins    Other Seated Knee/Hip Exercises Heel/Toe Raises x 20 reps; 3#    Marching Both;20 reps;Weights    Marching Weights 3 lbs.    Hamstring Curl Both;20 reps;Weights    Hamstring Limitations red tband      Shoulder Exercises: Seated   Extension Both;20 reps;Theraband    Theraband Level (Shoulder Extension) Level 2 (Red)    Row Both;20 reps;Theraband    Theraband Level (Shoulder Row) Level 2 (Red)                          PT Long Term Goals - 02/19/22 1538       PT LONG TERM GOAL #1   Title Patient will be independent with her HEP.    Time 6    Period Weeks    Status New    Target Date 04/02/22      PT LONG TERM GOAL #2   Title Patient will improve her five time sit to stand time to 14 seconds or less for improved safety and lower extremity power.    Time 6    Period Weeks  Status New    Target Date 04/02/22      PT LONG TERM GOAL #3   Title Patient will be able to demonstrate a gait speed of at least 1.2 m/s for improved community ambulation.    Time 6    Period Weeks    Status New    Target Date 04/02/22      PT LONG TERM GOAL #4   Title Patient will report feeling comfortable completing her daily activities without being limited by dizziness or instability.    Time 6    Period Weeks    Status New    Target Date 04/02/22                   Plan - 02/24/22 1043     Clinical Impression Statement Pt arrives for today's treatment session denying any pain, but does report minimal nausea.  Pt states that she felt good after last treatment session until she got home.  Pt able to tolerate warm up on the Nu-step today without pain or increase in nausea. Pt also able to tolerate increase weight and resistance with long arc quads and marches.  Pt denies any increased nausea at completion of today's treatment session.    Personal Factors and Comorbidities Comorbidity 2    Comorbidities HTN, OA    Examination-Activity Limitations Locomotion  Level;Transfers    Examination-Participation Restrictions Cleaning;Community Activity;Shop;Yard Work    [employer]chant [employer] officer Evolving/Moderate complexity    Rehab Potential Good    PT Frequency 2x / week    PT Duration 6 weeks    PT Treatment/Interventions ADLs/Self Care Home Management;Neuromuscular re-education;Balance training;Therapeutic exercise;Therapeutic activities;Functional mobility training;Gait training;Patient/family education    PT Next Visit Plan nustep, lower extremity strengthening and balance interventions    Consulted and Agree with Plan of Care Patient             Patient will benefit from skilled therapeutic intervention in order to improve the following deficits and impairments:  Difficulty walking, Dizziness, Decreased activity tolerance, Decreased balance, Decreased mobility, Decreased strength  Visit Diagnosis: Unsteadiness on feet  Muscle weakness (generalized)  Cerebellar bleed [facility])     Problem List Patient Active Problem List   Diagnosis Date Noted   Cerebellar bleed ([place]) 01/27/2022   ICH (intracerebral hemorrhage) ([place]) 01/20/2022   RAYNAUD'S SYNDROME 08/12/2010   Osteoarthritis, multiple sites 08/12/2010   ANEMIA-IRON DEFICIENCY 08/02/2009   Essential hypertension 08/02/2009   BURSITIS, HIP 08/02/2009   Rationale for Evaluation and Treatment Rehabilitation  Kathrynn Ducking, PTA 02/24/2022, 11:26 AM  [facility] [address] [place], [place], [address] Phone: [phone]   Fax:  [phone]  Name: KATEE WENTLAND MRN: [id number] Date of Birth: [birth year]

## 2022-02-27 ENCOUNTER — Ambulatory Visit: Payer: Medicare Other

## 2022-02-27 DIAGNOSIS — M6281 Muscle weakness (generalized): Secondary | ICD-10-CM | POA: Diagnosis not present

## 2022-02-27 DIAGNOSIS — R2681 Unsteadiness on feet: Secondary | ICD-10-CM | POA: Diagnosis not present

## 2022-02-27 DIAGNOSIS — I614 Nontraumatic intracerebral hemorrhage in cerebellum: Secondary | ICD-10-CM | POA: Diagnosis not present

## 2022-02-27 NOTE — Therapy (Signed)
[facility] [place], [place], [address] Phone: [phone]   Fax:  [phone]  Physical Therapy Treatment  Patient Details  Name: Molly Patterson MRN: [id number] Date of Birth: Jan 11, 1932 Referring Provider (PT): Donia Guiles, [place]   Encounter Date: 02/27/2022   PT End of Session - 02/27/22 1308     Visit Number 4    Number of Visits 12    Date for PT Re-Evaluation 05/09/22    PT Start Time 1301    PT Stop Time 1345    PT Time Calculation (min) 44 min    Activity Tolerance Patient tolerated treatment well    Behavior During Therapy [facility] for tasks assessed/performed             Past Medical History:  Diagnosis Date   Allergy    hayfever   ANEMIA-IRON DEFICIENCY 08/02/2009   Basal cell carcinoma (BCC) of right forearm    BURSITIS, HIP 08/02/2009   COVID-19    HYPERTENSION 08/02/2009   OSTEOARTHRITIS, GENERALIZED 08/12/2010   Raynaud's syndrome 08/12/2010   Transfusion history 09/15/1962   Vertigo     Past Surgical History:  Procedure Laterality Date   APPENDECTOMY     TONSILLECTOMY      There were no vitals filed for this visit.   Subjective Assessment - 02/27/22 1306     Subjective Patient reports that she is already nausous today. She has noticed that her vision will sometimes get blurry, but it will resolve shortly after it begins.    Pertinent History hard of hearing, history of COVID-19, OA    Limitations Walking;Standing    Patient Stated Goals improved balance    Currently in Pain? No/denies                     Vestibular Assessment - 02/27/22 0001       Symptom Behavior   Type of Dizziness  --   has not experienced dizziness in recent weeks; primarily nausea now   Symptom Nature Variable    Aggravating Factors No known aggravating factors    Relieving Factors No known relieving factors    Progression of Symptoms Better      Oculomotor Exam   Oculomotor Alignment Normal     Spontaneous Absent    Gaze-induced  Absent    Smooth Pursuits Intact      Vestibulo-Ocular Reflex   VOR 1 Head Only (x 1 viewing) limited by cervical muscle guarding      Positional Testing   Dix-Hallpike Dix-Hallpike Right;Dix-Hallpike Left   no symptoms     Dix-Hallpike Right   Dix-Hallpike Right Symptoms No nystagmus      Dix-Hallpike Left   Dix-Hallpike Left Symptoms No nystagmus                      [facility] Adult PT Treatment/Exercise - 02/27/22 0001       Knee/Hip Exercises: Aerobic   Nustep L3 x 15 minutes                          PT Long Term Goals - 02/19/22 1538       PT LONG TERM GOAL #1   Title Patient will be independent with her HEP.    Time 6    Period Weeks    Status New    Target Date 04/02/22      PT LONG TERM GOAL #2  Title Patient will improve her five time sit to stand time to 14 seconds or less for improved safety and lower extremity power.    Time 6    Period Weeks    Status New    Target Date 04/02/22      PT LONG TERM GOAL #3   Title Patient will be able to demonstrate a gait speed of at least 1.2 m/s for improved community ambulation.    Time 6    Period Weeks    Status New    Target Date 04/02/22      PT LONG TERM GOAL #4   Title Patient will report feeling comfortable completing her daily activities without being limited by dizziness or instability.    Time 6    Period Weeks    Status New    Target Date 04/02/22                   Plan - 02/27/22 1355     Clinical Impression Statement Patient presented to treatment reporting increased nausea. After completing the nustep, a vestibular assessment was performed to evaluate if this could be causing her symptoms. After performing the Dix-Halpike she reported that her nausea had completely resolved. However, this test was negative for posterior canal BPPV as she experienced no nystagmus or dizziness. She reports that she did not have any nausea or  dizziness upon the conclusion of treatment. She would benefit from skilled physical therapy to address her remaining impairments to return to her prior level of function.    Personal Factors and Comorbidities Comorbidity 2    Comorbidities HTN, OA    Examination-Activity Limitations Locomotion Level;Transfers    Examination-Participation Restrictions Cleaning;Community Activity;Shop;Yard Work    [employer]chant [employer] officer Evolving/Moderate complexity    Rehab Potential Good    PT Frequency 2x / week    PT Duration 6 weeks    PT Treatment/Interventions ADLs/Self Care Home Management;Neuromuscular re-education;Balance training;Therapeutic exercise;Therapeutic activities;Functional mobility training;Gait training;Patient/family education    PT Next Visit Plan nustep, lower extremity strengthening and balance interventions    Consulted and Agree with Plan of Care Patient             Patient will benefit from skilled therapeutic intervention in order to improve the following deficits and impairments:  Difficulty walking, Dizziness, Decreased activity tolerance, Decreased balance, Decreased mobility, Decreased strength  Visit Diagnosis: Unsteadiness on feet  Muscle weakness (generalized)     Problem List Patient Active Problem List   Diagnosis Date Noted   Cerebellar bleed ([place]) 01/27/2022   ICH (intracerebral hemorrhage) ([place]) 01/20/2022   RAYNAUD'S SYNDROME 08/12/2010   Osteoarthritis, multiple sites 08/12/2010   ANEMIA-IRON DEFICIENCY 08/02/2009   Essential hypertension 08/02/2009   BURSITIS, HIP 08/02/2009   Rationale for Evaluation and Treatment Rehabilitation   Darlin Coco, PT 02/27/2022, 4:23 PM  [facility] [place], [place], [address] Phone: [phone]   Fax:  [phone]  Name: Molly Patterson MRN: [id number] Date of Birth: 11-19-1931

## 2022-03-03 NOTE — Progress Notes (Unsigned)
PATIENT: Molly Patterson DOB: 1932-04-03  REASON FOR VISIT: follow up HISTORY FROM: patient PRIMARY NEUROLOGIST: Dr. Leonie Man  HISTORY OF PRESENT ILLNESS: Today 03/03/22  HISTORY Molly Patterson is a 88 y.o. female with history of HTN and osteoarthritis presenting with  severe dizziness with nausea and vomiting for about ten days for which she was seen twice in the ED prior to this admission.  CT head performed on admission here shows a small right cerebellar IPH.  Patient is now awaiting CIR bed, and stroke team will sign off but remain available for any questions or concerns   IPH:  right cerebellar IPH in setting of uncontrolled hypertension CT head Small acute IPH in right cerebellum CTA head & neck No LVO, aneurysm or vascular malformation, severe right P2 stenosis MRI  stable right cerebellar IPH, mild chronic microvascular disease 2D Echo EF 75-17%, grade 1 diastolic dysfunction, normal atrial septum LDL 125 HgbA1c 5.9 VTE prophylaxis - SCDs No antithrombotic prior to admission, now on No antithrombotic secondary to IPH Therapy recommendations:  CIR Disposition:  pending    REVIEW OF SYSTEMS: Out of a complete 14 system review of symptoms, the patient complains only of the following symptoms, and all other reviewed systems are negative.  ALLERGIES: Allergies  Allergen Reactions   Penicillins Shortness Of Breath and Swelling    REACTION: hives   Lidocaine-Epinephrine     REACTION: pulse rapic   Rofecoxib     Vioxx, itiching    HOME MEDICATIONS: Outpatient Medications Prior to Visit  Medication Sig Dispense Refill   acetaminophen (TYLENOL) 325 MG tablet Take 1-2 tablets (325-650 mg total) by mouth every 4 (four) hours as needed for mild pain.     amLODipine (NORVASC) 5 MG tablet Take 1 tablet (5 mg total) by mouth daily. 30 tablet 0   azelastine (ASTELIN) 0.1 % nasal spray Place 1 spray into both nostrils 2 (two) times daily. Use in each nostril as directed  (Patient taking differently: Place 1 spray into both nostrils daily as needed for rhinitis or allergies.) 30 mL 12   benazepril (LOTENSIN) 10 MG tablet Take 1 tablet (10 mg total) by mouth daily. 30 tablet 0   cholecalciferol (VITAMIN D3) 25 MCG (1000 UNIT) tablet Take 1 tablet (1,000 Units total) by mouth daily. 30 tablet 0   hydrochlorothiazide (HYDRODIURIL) 12.5 MG tablet Take 1 tablet (12.5 mg total) by mouth daily. 30 tablet 0   meclizine (ANTIVERT) 12.5 MG tablet Take 1 tablet (12.5 mg total) by mouth 3 (three) times daily as needed for dizziness. 90 tablet 0   Multiple Vitamin (MULTIVITAMIN WITH MINERALS) TABS tablet Take 1 tablet by mouth daily.     ondansetron (ZOFRAN-ODT) 8 MG disintegrating tablet Take 1 tablet (8 mg total) by mouth every 8 (eight) hours as needed for nausea or vomiting. 30 tablet 1   pantoprazole (PROTONIX) 40 MG tablet Take 1 tablet (40 mg total) by mouth at bedtime. 30 tablet 0   scopolamine (TRANSDERM-SCOP) 1 MG/3DAYS Place 1 patch (1.5 mg total) onto the skin every 3 (three) days. 10 patch 12   traMADol (ULTRAM) 50 MG tablet Take 1 tablet (50 mg total) by mouth 4 (four) times daily as needed for moderate pain or severe pain. 30 tablet 0   VENTOLIN HFA 108 (90 Base) MCG/ACT inhaler USE 2 PUFFS EVERY 6 HOURS AS NEEDED (Patient taking differently: 2 puffs every 6 (six) hours as needed for wheezing or shortness of breath.) 18 g 1  vitamin B-12 (CYANOCOBALAMIN) 1000 MCG tablet Take 1 tablet (1,000 mcg total) by mouth daily. 30 tablet 0   No facility-administered medications prior to visit.    PAST MEDICAL HISTORY: Past Medical History:  Diagnosis Date   Allergy    hayfever   ANEMIA-IRON DEFICIENCY 08/02/2009   Basal cell carcinoma (BCC) of right forearm    BURSITIS, HIP 08/02/2009   COVID-19    HYPERTENSION 08/02/2009   OSTEOARTHRITIS, GENERALIZED 08/12/2010   Raynaud's syndrome 08/12/2010   Transfusion history 09/15/1962   Vertigo     PAST SURGICAL  HISTORY: Past Surgical History:  Procedure Laterality Date   APPENDECTOMY     TONSILLECTOMY      FAMILY HISTORY: Family History  Problem Relation Age of Onset   Arthritis Neg Hx        family   Hypertension Neg Hx        family   Diabetes Neg Hx        family   Prostate cancer Neg Hx        family    SOCIAL HISTORY: Social History   Socioeconomic History   Marital status: Married    Spouse name: Not on file   Number of children: Not on file   Years of education: Not on file   Highest education level: Not on file  Occupational History   Not on file  Tobacco Use   Smoking status: Never   Smokeless tobacco: Never  Vaping Use   Vaping Use: Never used  Substance and Sexual Activity   Alcohol use: No   Drug use: Never   Sexual activity: Not on file  Other Topics Concern   Not on file  Social History Narrative   Not on file   Social Determinants of Health   Financial Resource Strain: Not on file  Food Insecurity: Not on file  Transportation Needs: Not on file  Physical Activity: Not on file  Stress: Not on file  Social Connections: Not on file  Intimate Partner Violence: Not on file      PHYSICAL EXAM  There were no vitals filed for this visit. There is no height or weight on file to calculate BMI.  Generalized: Well developed, in no acute distress   Neurological examination  Mentation: Alert oriented to time, place, history taking. Follows all commands speech and language fluent Cranial nerve II-XII: Pupils were equal round reactive to light. Extraocular movements were full, visual field were full on confrontational test. Facial sensation and strength were normal. Uvula tongue midline. Head turning and shoulder shrug  were normal and symmetric. Motor: The motor testing reveals 5 over 5 strength of all 4 extremities. Good symmetric motor tone is noted throughout.  Sensory: Sensory testing is intact to soft touch on all 4 extremities. No evidence of  extinction is noted.  Coordination: Cerebellar testing reveals good finger-nose-finger and heel-to-shin bilaterally.  Gait and station: Gait is normal. Tandem gait is normal. Romberg is negative. No drift is seen.  Reflexes: Deep tendon reflexes are symmetric and normal bilaterally.   DIAGNOSTIC DATA (LABS, IMAGING, TESTING) - I reviewed patient records, labs, notes, testing and imaging myself where available.  Lab Results  Component Value Date   WBC 7.9 02/03/2022   HGB 12.6 02/03/2022   HCT 38.7 02/03/2022   MCV 89.2 02/03/2022   PLT 226 02/03/2022      Component Value Date/Time   NA 139 02/03/2022 0541   K 3.6 02/03/2022 0541   CL 102 02/03/2022  0541   CO2 32 02/03/2022 0541   GLUCOSE 96 02/03/2022 0541   BUN 16 02/03/2022 0541   CREATININE 0.81 02/03/2022 0541   CREATININE 0.73 07/18/2020 1517   CALCIUM 9.1 02/03/2022 0541   PROT 5.9 (L) 01/28/2022 0541   ALBUMIN 3.0 (L) 01/28/2022 0541   AST 17 01/28/2022 0541   ALT 21 01/28/2022 0541   ALKPHOS 63 01/28/2022 0541   BILITOT 0.8 01/28/2022 0541   GFRNONAA >60 02/03/2022 0541   Lab Results  Component Value Date   CHOL 187 01/22/2022   HDL 45 01/22/2022   LDLCALC 125 (H) 01/22/2022   TRIG 83 01/22/2022   CHOLHDL 4.2 01/22/2022   Lab Results  Component Value Date   HGBA1C 5.9 (H) 01/22/2022   No results found for: "VITAMINB12" Lab Results  Component Value Date   TSH 1.37 01/14/2021      ASSESSMENT AND PLAN 88 y.o. year old female  has a past medical history of Allergy, ANEMIA-IRON DEFICIENCY (08/02/2009), Basal cell carcinoma (BCC) of right forearm, BURSITIS, HIP (08/02/2009), COVID-19, HYPERTENSION (08/02/2009), OSTEOARTHRITIS, GENERALIZED (08/12/2010), Raynaud's syndrome (08/12/2010), Transfusion history (09/15/1962), and Vertigo. here with ***     Ward Givens, MSN, NP-C 03/03/2022, 5:10 PM [facility] [address], [place], [place] [address] [phone]

## 2022-03-04 ENCOUNTER — Ambulatory Visit: Payer: Medicare Other | Admitting: Adult Health

## 2022-03-04 ENCOUNTER — Encounter: Payer: Self-pay | Admitting: Adult Health

## 2022-03-04 VITALS — BP 128/75 | HR 42 | Ht 62.0 in | Wt 119.0 lb

## 2022-03-04 DIAGNOSIS — I10 Essential (primary) hypertension: Secondary | ICD-10-CM

## 2022-03-04 DIAGNOSIS — I614 Nontraumatic intracerebral hemorrhage in cerebellum: Secondary | ICD-10-CM

## 2022-03-04 DIAGNOSIS — E785 Hyperlipidemia, unspecified: Secondary | ICD-10-CM | POA: Diagnosis not present

## 2022-03-04 NOTE — Patient Instructions (Addendum)
HTN: BP goal Normotensive.  HLD: LDL goal <70. Recent LDL 125. REFUSED Statin  DMII: A1c goal<7.0. Recent A1c 5.9.  Encouraged patient to monitor diet and encouraged exercise Take zofran or meclizine only when  FU with our office 6  months

## 2022-03-05 ENCOUNTER — Ambulatory Visit: Payer: Medicare Other

## 2022-03-05 DIAGNOSIS — R2681 Unsteadiness on feet: Secondary | ICD-10-CM

## 2022-03-05 DIAGNOSIS — M6281 Muscle weakness (generalized): Secondary | ICD-10-CM | POA: Diagnosis not present

## 2022-03-05 DIAGNOSIS — I614 Nontraumatic intracerebral hemorrhage in cerebellum: Secondary | ICD-10-CM | POA: Diagnosis not present

## 2022-03-05 NOTE — Therapy (Signed)
[facility] [place], [place], [address] Phone: [phone]   Fax:  [phone]  Physical Therapy Treatment  Patient Details  Name: Molly Patterson MRN: [id number] Date of Birth: 1932/01/08 Referring Provider (PT): Donia Guiles, [place]   Encounter Date: 03/05/2022   PT End of Session - 03/05/22 1313     Visit Number 5    Number of Visits 12    Date for PT Re-Evaluation 05/09/22    PT Start Time 1301    PT Stop Time 1343    PT Time Calculation (min) 42 min    Activity Tolerance Patient tolerated treatment well    Behavior During Therapy [facility] for tasks assessed/performed             Past Medical History:  Diagnosis Date   Allergy    hayfever   ANEMIA-IRON DEFICIENCY 08/02/2009   Basal cell carcinoma (BCC) of right forearm    BURSITIS, HIP 08/02/2009   COVID-19    HYPERTENSION 08/02/2009   OSTEOARTHRITIS, GENERALIZED 08/12/2010   Raynaud's syndrome 08/12/2010   Transfusion history 09/15/1962   Vertigo     Past Surgical History:  Procedure Laterality Date   APPENDECTOMY     TONSILLECTOMY      There were no vitals filed for this visit.   Subjective Assessment - 03/05/22 1309     Subjective Patient reports that she is a little nauseous today. However, she notes that she felt good after her last appointment until Saturday. She feels that she is getting better. She saw the neurologist yesterday and was told to reduce her medication to PRN.    Pertinent History hard of hearing, history of COVID-19, OA    Limitations Walking;Standing    Patient Stated Goals improved balance    Currently in Pain? No/denies                               [facility] Adult PT Treatment/Exercise - 03/05/22 0001       Knee/Hip Exercises: Aerobic   Nustep L3 x 17 minutes      Knee/Hip Exercises: Seated   Long Arc Quad Both;20 reps;Weights    Long Arc Quad Weight 3 lbs.    Marching Both;20 reps;Weights     Hamstring Limitations .             Vestibular Treatment/Exercise - 03/05/22 0001       Vestibular Treatment/Exercise   Canalith Repositioning Epley Manuever Right       EPLEY MANUEVER RIGHT   Number of Reps  1    Overall Response Symptoms Resolved    Response Details  Patient reported no nausea after Epley was performed                         PT Long Term Goals - 02/19/22 1538       PT LONG TERM GOAL #1   Title Patient will be independent with her HEP.    Time 6    Period Weeks    Status New    Target Date 04/02/22      PT LONG TERM GOAL #2   Title Patient will improve her five time sit to stand time to 14 seconds or less for improved safety and lower extremity power.    Time 6    Period Weeks    Status New    Target Date 04/02/22  PT LONG TERM GOAL #3   Title Patient will be able to demonstrate a gait speed of at least 1.2 m/s for improved community ambulation.    Time 6    Period Weeks    Status New    Target Date 04/02/22      PT LONG TERM GOAL #4   Title Patient will report feeling comfortable completing her daily activities without being limited by dizziness or instability.    Time 6    Period Weeks    Status New    Target Date 04/02/22                   Plan - 03/05/22 1313     Clinical Impression Statement Patient was presented to treatment with mild nausea. However, this was able to be completely resolved with a right Epley maneuver. This was followed by familiar interventions for improved lower extremity strength. Her HEP was updated to include a self Epley maneuver to address any symptoms of nausea between appointments. She reported feeling comfortable with this intervention. Recommend that she continue with skilled physical therapy to address her remaining impairments to return to her prior level of function.    Personal Factors and Comorbidities Comorbidity 2    Comorbidities HTN, OA    Examination-Activity  Limitations Locomotion Level;Transfers    Examination-Participation Restrictions Cleaning;Community Activity;Shop;Yard Work    [employer]chant [employer] officer Evolving/Moderate complexity    Rehab Potential Good    PT Frequency 2x / week    PT Duration 6 weeks    PT Treatment/Interventions ADLs/Self Care Home Management;Neuromuscular re-education;Balance training;Therapeutic exercise;Therapeutic activities;Functional mobility training;Gait training;Patient/family education;Vestibular;Canalith Repostioning    PT Next Visit Plan nustep, lower extremity strengthening and balance interventions    Consulted and Agree with Plan of Care Patient             Patient will benefit from skilled therapeutic intervention in order to improve the following deficits and impairments:  Difficulty walking, Dizziness, Decreased activity tolerance, Decreased balance, Decreased mobility, Decreased strength  Visit Diagnosis: Unsteadiness on feet  Muscle weakness (generalized)     Problem List Patient Active Problem List   Diagnosis Date Noted   Cerebellar bleed ([place]) 01/27/2022   ICH (intracerebral hemorrhage) ([place]) 01/20/2022   RAYNAUD'S SYNDROME 08/12/2010   Osteoarthritis, multiple sites 08/12/2010   ANEMIA-IRON DEFICIENCY 08/02/2009   Essential hypertension 08/02/2009   BURSITIS, HIP 08/02/2009   Rationale for Evaluation and Treatment Rehabilitation   Darlin Coco, PT 03/05/2022, 3:52 PM  [facility] [address] [place], [place], [address] Phone: [phone]   Fax:  [phone]  Name: Molly Patterson MRN: [id number] Date of Birth: 07-06-1932

## 2022-03-06 ENCOUNTER — Other Ambulatory Visit: Payer: Self-pay | Admitting: Family Medicine

## 2022-03-12 ENCOUNTER — Ambulatory Visit: Payer: Medicare Other

## 2022-03-12 DIAGNOSIS — I614 Nontraumatic intracerebral hemorrhage in cerebellum: Secondary | ICD-10-CM

## 2022-03-12 DIAGNOSIS — M6281 Muscle weakness (generalized): Secondary | ICD-10-CM

## 2022-03-12 DIAGNOSIS — R2681 Unsteadiness on feet: Secondary | ICD-10-CM

## 2022-03-12 NOTE — Therapy (Signed)
[facility] [place], [place], [address] Phone: [phone]   Fax:  [phone]  Physical Therapy Treatment  Patient Details  Name: Molly Patterson MRN: [id number] Date of Birth: 16-Jan-1932 Referring Provider (PT): Donia Guiles, [place]   Encounter Date: 03/12/2022   PT End of Session - 03/12/22 1305     Visit Number 6    Number of Visits 12    Date for PT Re-Evaluation 05/09/22    PT Start Time 1300    PT Stop Time 1345    PT Time Calculation (min) 45 min    Activity Tolerance Patient tolerated treatment well    Behavior During Therapy [facility] for tasks assessed/performed             Past Medical History:  Diagnosis Date   Allergy    hayfever   ANEMIA-IRON DEFICIENCY 08/02/2009   Basal cell carcinoma (BCC) of right forearm    BURSITIS, HIP 08/02/2009   COVID-19    HYPERTENSION 08/02/2009   OSTEOARTHRITIS, GENERALIZED 08/12/2010   Raynaud's syndrome 08/12/2010   Transfusion history 09/15/1962   Vertigo     Past Surgical History:  Procedure Laterality Date   APPENDECTOMY     TONSILLECTOMY      There were no vitals filed for this visit.   Subjective Assessment - 03/12/22 1304     Subjective Pt arrives for today's treatment session denying any pain.  Pt stated that she did not have a nausea episode after her last treatment last Wednesday until yesterday and it "came and went fast."    Pertinent History hard of hearing, history of COVID-19, OA    Limitations Walking;Standing    Patient Stated Goals improved balance    Currently in Pain? No/denies                               [facility] Adult PT Treatment/Exercise - 03/12/22 0001       Knee/Hip Exercises: Aerobic   Nustep Lvl 3 x 17 mins      Knee/Hip Exercises: Standing   Heel Raises Both;20 reps    Heel Raises Limitations Toe Raises x 20 reps    Hip Flexion Both;20 reps;Knee bent    Hip Flexion Limitations BUE support    Hip  Abduction Both;20 reps;Knee straight    Abduction Limitations BUE support    Hip Extension Both;20 reps;Knee straight    Extension Limitations BUE support    Forward Step Up Both;20 reps;Hand Hold: 2;Step Height: 6"      Knee/Hip Exercises: Seated   Long Arc Quad Both;20 reps;Weights    Long Arc Quad Weight 4 lbs.    Marching Both;20 reps;Weights    Marching Weights 4 lbs.    Hamstring Curl Both;20 reps;Weights    Hamstring Limitations green tband                          PT Long Term Goals - 02/19/22 1538       PT LONG TERM GOAL #1   Title Patient will be independent with her HEP.    Time 6    Period Weeks    Status New    Target Date 04/02/22      PT LONG TERM GOAL #2   Title Patient will improve her five time sit to stand time to 14 seconds or less for improved safety and lower extremity power.  Time 6    Period Weeks    Status New    Target Date 04/02/22      PT LONG TERM GOAL #3   Title Patient will be able to demonstrate a gait speed of at least 1.2 m/s for improved community ambulation.    Time 6    Period Weeks    Status New    Target Date 04/02/22      PT LONG TERM GOAL #4   Title Patient will report feeling comfortable completing her daily activities without being limited by dizziness or instability.    Time 6    Period Weeks    Status New    Target Date 04/02/22                   Plan - 03/12/22 1305     Clinical Impression Statement Pt arrives for today's treatment session denying any pain or nausea.  Pt able to tolerate standing exercises today very well.  Pt requiring mod cues for proper technique and posture as well as to avoid compensatory leaning.  Pt given rest breaks as needed due to fatigue.  Pt able to tolerate increased resistance with all seated exercises today without issue or complaint.  Pt denied any pain or nausea at completion of today's treatment session.    Personal Factors and Comorbidities Comorbidity 2     Comorbidities HTN, OA    Examination-Activity Limitations Locomotion Level;Transfers    Examination-Participation Restrictions Cleaning;Community Activity;Shop;Yard Work    [employer]chant [employer] officer Evolving/Moderate complexity    Rehab Potential Good    PT Frequency 2x / week    PT Duration 6 weeks    PT Treatment/Interventions ADLs/Self Care Home Management;Neuromuscular re-education;Balance training;Therapeutic exercise;Therapeutic activities;Functional mobility training;Gait training;Patient/family education;Vestibular;Canalith Repostioning    PT Next Visit Plan nustep, lower extremity strengthening and balance interventions    Consulted and Agree with Plan of Care Patient             Patient will benefit from skilled therapeutic intervention in order to improve the following deficits and impairments:  Difficulty walking, Dizziness, Decreased activity tolerance, Decreased balance, Decreased mobility, Decreased strength  Visit Diagnosis: Unsteadiness on feet  Muscle weakness (generalized)  Cerebellar bleed [facility])     Problem List Patient Active Problem List   Diagnosis Date Noted   Cerebellar bleed ([place]) 01/27/2022   ICH (intracerebral hemorrhage) ([place]) 01/20/2022   RAYNAUD'S SYNDROME 08/12/2010   Osteoarthritis, multiple sites 08/12/2010   ANEMIA-IRON DEFICIENCY 08/02/2009   Essential hypertension 08/02/2009   BURSITIS, HIP 08/02/2009   Rationale for Evaluation and Treatment Rehabilitation  Kathrynn Ducking, PTA 03/12/2022, 1:47 PM  [facility] [address] [place], [place], [address] Phone: [phone]   Fax:  [phone]  Name: Molly Patterson MRN: [id number] Date of Birth: 1932-03-29

## 2022-03-19 ENCOUNTER — Ambulatory Visit: Payer: Medicare Other | Attending: Physician Assistant

## 2022-03-19 DIAGNOSIS — I614 Nontraumatic intracerebral hemorrhage in cerebellum: Secondary | ICD-10-CM | POA: Diagnosis not present

## 2022-03-19 DIAGNOSIS — M6281 Muscle weakness (generalized): Secondary | ICD-10-CM | POA: Insufficient documentation

## 2022-03-19 DIAGNOSIS — R2681 Unsteadiness on feet: Secondary | ICD-10-CM | POA: Insufficient documentation

## 2022-03-19 NOTE — Therapy (Signed)
OUTPATIENT PHYSICAL THERAPY TREATMENT NOTE   Patient Name: Molly Patterson MRN: [id number] DOB:September 14, 1932, 88 y.o., female Today's Date: 03/19/2022  PCP: Eulas Post, MD REFERRING PROVIDER: Cleatis Polka   PT End of Session - 03/19/22 1306     Visit Number 7    Number of Visits 12    Date for PT Re-Evaluation 05/09/22    PT Start Time 1300    PT Stop Time 3354    PT Time Calculation (min) 43 min    Activity Tolerance Patient tolerated treatment well    Behavior During Therapy [facility] for tasks assessed/performed             Past Medical History:  Diagnosis Date   Allergy    hayfever   ANEMIA-IRON DEFICIENCY 08/02/2009   Basal cell carcinoma (BCC) of right forearm    BURSITIS, HIP 08/02/2009   COVID-19    HYPERTENSION 08/02/2009   OSTEOARTHRITIS, GENERALIZED 08/12/2010   Raynaud's syndrome 08/12/2010   Transfusion history 09/15/1962   Vertigo    Past Surgical History:  Procedure Laterality Date   APPENDECTOMY     TONSILLECTOMY     Patient Active Problem List   Diagnosis Date Noted   Cerebellar bleed ([place]) 01/27/2022   ICH (intracerebral hemorrhage) ([place]) 01/20/2022   RAYNAUD'S SYNDROME 08/12/2010   Osteoarthritis, multiple sites 08/12/2010   ANEMIA-IRON DEFICIENCY 08/02/2009   Essential hypertension 08/02/2009   BURSITIS, HIP 08/02/2009    REFERRING DIAG: Cerebellar bleed   THERAPY DIAG:  Unsteadiness on feet  Muscle weakness (generalized)  Cerebellar bleed ([place])  Rationale for Evaluation and Treatment Rehabilitation  PERTINENT HISTORY: hard of hearing, history of COVID-19, OA  PRECAUTIONS: Fall  SUBJECTIVE: Pt reports that she has had two bad days in a row due to not having her patch.   PAIN:  Are you having pain? No     TODAY'S TREATMENT:                                    7/5 EXERCISE LOG  Exercise Repetitions and Resistance Comments  Nustep Lvl 3 x 20 mins   Hip Flexion BLE, 20 reps   Hip Abduction BLE, 20 reps   Hip  Extension BLE, 20 reps   Heel/Toe Raises 20 reps each   Forward Step Ups 6" step, BUE support, BLE 20 reps   Long Arc Quad BLE, 4#, 25 reps   Marching BLE, 4#, 25 reps   Ham Curls BLE, green tband, 25 reps        Blank cell = exercise not performed today   PATIENT EDUCATION: Education details: HEP continuation Person educated: Patient Education method: Explanation, Demonstration, Tactile cues, and Verbal cues Education comprehension: verbalized understanding, returned demonstration, verbal cues required, tactile cues required, and needs further education      PT Long Term Goals - 03/19/22 1306       PT LONG TERM GOAL #1   Title Patient will be independent with her HEP.    Time 6    Period Weeks    Status New    Target Date 04/02/22      PT LONG TERM GOAL #2   Title Patient will improve her five time sit to stand time to 14 seconds or less for improved safety and lower extremity power.    Time 6    Period Weeks    Status New    Target Date 04/02/22  PT LONG TERM GOAL #3   Title Patient will be able to demonstrate a gait speed of at least 1.2 m/s for improved community ambulation.    Time 6    Period Weeks    Status New    Target Date 04/02/22      PT LONG TERM GOAL #4   Title Patient will report feeling comfortable completing her daily activities without being limited by dizziness or instability.    Time 6    Period Weeks    Status New    Target Date 04/02/22              Plan - 03/19/22 1306     Clinical Impression Statement Pt arrives for today's treatment session denying any pain ro nausea, but states that she had two bad days due to not having her medication patch.  Within thirty mins of putting the patch on her nausea subsided.  Pt able to tolerate increased reps with seated exercises today without issue.  Pt denied any pain or nausea at completion of today's treatment session.    Personal Factors and Comorbidities Comorbidity 2    Comorbidities  HTN, OA    Examination-Activity Limitations Locomotion Level;Transfers    Examination-Participation Restrictions Cleaning;Community Activity;Shop;Yard Work    [employer]chant [employer] officer Evolving/Moderate complexity    Rehab Potential Good    PT Frequency 2x / week    PT Duration 6 weeks    PT Treatment/Interventions ADLs/Self Care Home Management;Neuromuscular re-education;Balance training;Therapeutic exercise;Therapeutic activities;Functional mobility training;Gait training;Patient/family education;Vestibular;Canalith Repostioning    PT Next Visit Plan nustep, lower extremity strengthening and balance interventions    Consulted and Agree with Plan of Care Patient               Kathrynn Ducking, PTA 03/19/2022, 1:43 PM

## 2022-03-20 ENCOUNTER — Other Ambulatory Visit: Payer: Self-pay | Admitting: Family Medicine

## 2022-03-26 ENCOUNTER — Ambulatory Visit: Payer: Medicare Other

## 2022-03-26 DIAGNOSIS — M6281 Muscle weakness (generalized): Secondary | ICD-10-CM

## 2022-03-26 DIAGNOSIS — I614 Nontraumatic intracerebral hemorrhage in cerebellum: Secondary | ICD-10-CM | POA: Diagnosis not present

## 2022-03-26 DIAGNOSIS — R2681 Unsteadiness on feet: Secondary | ICD-10-CM

## 2022-03-26 NOTE — Therapy (Addendum)
OUTPATIENT PHYSICAL THERAPY TREATMENT NOTE   Patient Name: Molly Patterson MRN: [id number] DOB:1931-11-02, 88 y.o., female Today's Date: 03/26/2022  REFERRING PROVIDER: Cathlyn Parsons, PA-C   PT End of Session - 03/26/22 1300     Visit Number 8    Number of Visits 12    Date for PT Re-Evaluation 05/09/22    PT Start Time 1301    PT Stop Time 1343    PT Time Calculation (min) 42 min    Activity Tolerance Patient tolerated treatment well    Behavior During Therapy [facility] for tasks assessed/performed             Past Medical History:  Diagnosis Date   Allergy    hayfever   ANEMIA-IRON DEFICIENCY 08/02/2009   Basal cell carcinoma (BCC) of right forearm    BURSITIS, HIP 08/02/2009   COVID-19    HYPERTENSION 08/02/2009   OSTEOARTHRITIS, GENERALIZED 08/12/2010   Raynaud's syndrome 08/12/2010   Transfusion history 09/15/1962   Vertigo    Past Surgical History:  Procedure Laterality Date   APPENDECTOMY     TONSILLECTOMY     Patient Active Problem List   Diagnosis Date Noted   Cerebellar bleed ([place]) 01/27/2022   ICH (intracerebral hemorrhage) ([place]) 01/20/2022   RAYNAUD'S SYNDROME 08/12/2010   Osteoarthritis, multiple sites 08/12/2010   ANEMIA-IRON DEFICIENCY 08/02/2009   Essential hypertension 08/02/2009   BURSITIS, HIP 08/02/2009    REFERRING DIAG: 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 She notes that she has not thrown up any since her last appointment. She is also able to get out and go shopping without her walker or any nausea. She is pleased that she can now cook and go outside and garden like she was prior to this episode.   PAIN:  Are you having  pain? No     TODAY'S TREATMENT:                                    7/12 EXERCISE LOG  Exercise Repetitions and Resistance Comments  Nustep L4 x 16 minutes   LAQ  30 reps each   Standing heel/ toe raises  20 reps with UE support   Sit to stand 20 reps without UE support   Standing hip ABD 20 reps each with UE support    Blank cell = exercise not performed today   TIMED UP AND GO (TUG): 11.69 seconds  5x SIT TO STAND: 10.23 seconds  PATIENT EDUCATION: Education details: progress with therapy, HEP, POC Person educated: Patient Education method: Explanation Education comprehension: verbalized understanding   HOME EXERCISE PROGRAM: [id number]     PT Long Term Goals - 03/19/22 1306       PT LONG TERM GOAL #1   Title Patient will be independent with her HEP.    Time 6    Period Weeks    Status On-going   Target Date 04/02/22      PT LONG TERM GOAL #2   Title Patient will improve her five time sit to stand time to 14 seconds or less for improved safety and lower extremity power.    Time 6  Period Weeks    Status Achieved    Target Date 04/02/22      PT LONG TERM GOAL #3   Title Patient will be able to demonstrate a gait speed of at least 1.2 m/s for improved community ambulation.    Time 6    Period Weeks    Baseline 1.05 m/s without an assistive device   Status On-going   Target Date 04/02/22      PT LONG TERM GOAL #4   Title Patient will report feeling comfortable completing her daily activities without being limited by dizziness or instability.    Time 6    Period Weeks    Status Achieved   Target Date 04/02/22              Plan - 03/26/22 1302     Clinical Impression Statement Patient has made excellent progress with skilled physical therapy as evidenced by her subjective reports, functional mobility, and progress toward her goals. She was able to demonstrate a significant improvement in her five time sit to stand time and gait speed with improved  independence. Treatment focused on familiar interventions and her HEP was reviewed. She wished to be placed on hold at this time until she follows up with her physical on 04/04/22. She was encouraged to continue with her HEP.     Personal Factors and Comorbidities Comorbidity 2    Comorbidities HTN, OA    Examination-Activity Limitations Locomotion Level;Transfers    Examination-Participation Restrictions Cleaning;Community Activity;Shop;Yard Work    [employer]chant [employer] officer Evolving/Moderate complexity    Rehab Potential Good    PT Frequency 2x / week    PT Duration 6 weeks    PT Treatment/Interventions ADLs/Self Care Home Management;Neuromuscular re-education;Balance training;Therapeutic exercise;Therapeutic activities;Functional mobility training;Gait training;Patient/family education;Vestibular;Canalith Repostioning    PT Next Visit Plan nustep, lower extremity strengthening and balance interventions    Consulted and Agree with Plan of Care Patient            PHYSICAL THERAPY DISCHARGE SUMMARY  Visits from Start of Care: 8  Current functional level related to goals / functional outcomes: Patient was able to meet most of her goals for skilled physical therapy.  She reported feeling satisfied with her progress and felt comfortable being discharged with her HEP.   Remaining deficits: Gait speed   Education / Equipment: HEP  Patient agrees to discharge. Patient goals were partially met. Patient is being discharged due to being pleased with the current functional level.    Darlin Coco, PT 03/26/2022, 1:59 PM

## 2022-03-27 ENCOUNTER — Other Ambulatory Visit: Payer: Self-pay | Admitting: Family Medicine

## 2022-04-04 ENCOUNTER — Encounter: Payer: Medicare Other | Attending: Registered Nurse | Admitting: Physical Medicine & Rehabilitation

## 2022-04-04 ENCOUNTER — Encounter: Payer: Self-pay | Admitting: Physical Medicine & Rehabilitation

## 2022-04-04 VITALS — BP 136/77 | HR 104 | Ht 62.0 in | Wt 121.2 lb

## 2022-04-04 DIAGNOSIS — I614 Nontraumatic intracerebral hemorrhage in cerebellum: Secondary | ICD-10-CM | POA: Diagnosis not present

## 2022-04-04 NOTE — Progress Notes (Signed)
Subjective:    Patient ID: Molly Patterson, female    DOB: [birth year], 88 y.o.   MRN: [id number] 88 y.o. right-handed female with history of osteoarthritis, hypertension, Raynaud's, vertigo who was admitted with a 10-day history of severe dizziness with nausea vomiting.  She was seen in the ED 4/30 and 5/5 at [facility] with negative CT.  She presented to [facility] on 01/20/2022 with ongoing dizziness nausea and inability to eat.  She was reported having ICH about 30 years ago that did not require any intervention.  CTA head showed new small acute IPH in the right cerebellum and mild atrophy with small vessel disease.  CTA head and neck was negative for LVO and showed severe right P2 stenosis.  Dr. Arnoldo Morale of neurosurgery consulted for input recommended observation no surgical needs.  MRI of the brain revealed stable acute hemorrhage involving right cerebellum without lesion on noncontrast exam.  2D echo ejection fraction 6065% with trivial AVR and MVR.  Neurology felt stroke related accelerated hypertension she was started on clevidipine.  She continued to be limited by unsteady gait with intermittent dizziness and was admitted for a comprehensive rehab program.    Admit date: 01/27/2022 Discharge date: 02/04/2021  HPI Patient follows up today after inpatient hospitalization following cerebellar hemorrhage.  Patient feels like she is doing quite well. Nausea resolved Finished OP PT Cooking canned pickles, making the bed No falls  Pt feels back to normal  Saw eye Dr. , has a floater in Left eye  Has seen PCP, Dr. Elease Hashimoto Has seen neurology nurse practitioner Pain Inventory Average Pain 0 Pain Right Now 0 My pain is  no pain  LOCATION OF PAIN  no pain   BOWEL Number of stools per week: 7 Oral laxative use No  Type of laxative . Enema or suppository use No  History of colostomy No  Incontinent No   BLADDER Normal In and out cath, frequency . Able to self cath   . Bladder incontinence No  Frequent urination No  Leakage with coughing No  Difficulty starting stream No  Incomplete bladder emptying No    Mobility walk without assistance  Function retired  Neuro/Psych dizziness  Prior Studies Any changes since last visit?  yes  Physicians involved in your care Primary care Dr. Elease Hashimoto   Family History  Problem Relation Age of Onset   Arthritis Neg Hx        family   Hypertension Neg Hx        family   Diabetes Neg Hx        family   Prostate cancer Neg Hx        family   Social History   Socioeconomic History   Marital status: Married    Spouse name: Not on file   Number of children: Not on file   Years of education: Not on file   Highest education level: Not on file  Occupational History   Not on file  Tobacco Use   Smoking status: Never   Smokeless tobacco: Never  Vaping Use   Vaping Use: Never used  Substance and Sexual Activity   Alcohol use: No   Drug use: Never   Sexual activity: Not on file  Other Topics Concern   Not on file  Social History Narrative   Not on file   Social Determinants of Health   Financial Resource Strain: Not on file  Food Insecurity: Not on file  Transportation Needs: Not  on file  Physical Activity: Not on file  Stress: Not on file  Social Connections: Not on file   Past Surgical History:  Procedure Laterality Date   APPENDECTOMY     TONSILLECTOMY     Past Medical History:  Diagnosis Date   Allergy    hayfever   ANEMIA-IRON DEFICIENCY 08/02/2009   Basal cell carcinoma (BCC) of right forearm    BURSITIS, HIP 08/02/2009   COVID-19    HYPERTENSION 08/02/2009   OSTEOARTHRITIS, GENERALIZED 08/12/2010   Raynaud's syndrome 08/12/2010   Transfusion history 09/15/1962   Vertigo    BP 136/77   Pulse (!) 104   Ht '5\' 2"'$  (1.575 m)   Wt 121 lb 3.2 oz (55 kg)   SpO2 97%   BMI 22.17 kg/m   Opioid Risk Score:   Fall Risk Score:  `1  Depression screen PHQ 2/9      02/14/2022    2:20 PM 07/27/2017   11:36 AM 07/25/2015    2:50 PM 07/21/2014   10:38 AM 07/04/2013    2:55 PM  Depression screen PHQ 2/9  Decreased Interest 0 0 0 0 0  Down, Depressed, Hopeless 0 0 0 0 0  PHQ - 2 Score 0 0 0 0 0  Altered sleeping 2      Tired, decreased energy 1      Change in appetite 1      Feeling bad or failure about yourself  0      Trouble concentrating 0      Moving slowly or fidgety/restless 0      Suicidal thoughts 0      PHQ-9 Score 4      Difficult doing work/chores Somewhat difficult         Review of Systems  Neurological:  Positive for dizziness.  All other systems reviewed and are negative.     Objective:   Physical Exam Vitals and nursing note reviewed.  Constitutional:      Appearance: She is normal weight.  HENT:     Head: Normocephalic and atraumatic.  Eyes:     Extraocular Movements: Extraocular movements intact.     Conjunctiva/sclera: Conjunctivae normal.     Pupils: Pupils are equal, round, and reactive to light.  Neurological:     Mental Status: She is alert and oriented to person, place, and time.     Cranial Nerves: No dysarthria or facial asymmetry.     Coordination: Romberg sign positive. Finger-Nose-Finger Test and Heel to [facility] Test normal. Rapid alternating movements normal.     Gait: Gait is intact.     Comments: Motor strength is 5/5 bilateral deltoid bicep tricep grip hip flexor knee extensor ankle dorsiflexor  Psychiatric:        Mood and Affect: Mood normal.        Behavior: Behavior normal.           Assessment & Plan:   1.  Right cerebellar hemorrhage excellent recovery.  Main physical exam finding is positive Romberg with eyes closed.  Advised patient to keep lights on if she gets up at night. Patient will follow-up with neurology in January Follow-up with PCP No need for physical medicine follow-up at this time

## 2022-04-21 ENCOUNTER — Other Ambulatory Visit: Payer: Self-pay

## 2022-04-21 NOTE — Patient Outreach (Signed)
[name] Care Management  04/21/2022  Molly Patterson 1931/11/25 [id number]   Telephone outreach to patient to obtain mRS was successfully completed. MRS= [id number]  [place] Care Management Assistant [phone]

## 2022-05-01 ENCOUNTER — Other Ambulatory Visit: Payer: Self-pay | Admitting: Family Medicine

## 2022-05-02 MED ORDER — AMLODIPINE BESYLATE 5 MG PO TABS: 5.0000 mg | ORAL_TABLET | Freq: Every day | ORAL | 3 refills | Status: DC

## 2022-05-02 NOTE — Telephone Encounter (Signed)
Rx done. 

## 2022-05-10 ENCOUNTER — Other Ambulatory Visit: Payer: Self-pay | Admitting: Family Medicine

## 2022-06-02 DIAGNOSIS — H2513 Age-related nuclear cataract, bilateral: Secondary | ICD-10-CM | POA: Diagnosis not present

## 2022-06-20 ENCOUNTER — Other Ambulatory Visit: Payer: Self-pay | Admitting: Family Medicine

## 2022-06-20 NOTE — Telephone Encounter (Addendum)
Last OV-02-18-22 Last refill was sent in by historical provider  Okay for refill?

## 2022-07-01 ENCOUNTER — Other Ambulatory Visit: Payer: Self-pay | Admitting: Family Medicine

## 2022-07-21 ENCOUNTER — Other Ambulatory Visit: Payer: Self-pay | Admitting: Family Medicine

## 2022-07-29 ENCOUNTER — Ambulatory Visit (INDEPENDENT_AMBULATORY_CARE_PROVIDER_SITE_OTHER): Payer: Medicare Other | Admitting: Family Medicine

## 2022-07-29 VITALS — BP 132/70 | HR 90 | Temp 97.6°F | Wt 125.7 lb

## 2022-07-29 DIAGNOSIS — I614 Nontraumatic intracerebral hemorrhage in cerebellum: Secondary | ICD-10-CM

## 2022-07-29 DIAGNOSIS — Z23 Encounter for immunization: Secondary | ICD-10-CM

## 2022-07-29 DIAGNOSIS — I10 Essential (primary) hypertension: Secondary | ICD-10-CM

## 2022-07-29 DIAGNOSIS — M159 Polyosteoarthritis, unspecified: Secondary | ICD-10-CM | POA: Diagnosis not present

## 2022-07-29 NOTE — Patient Instructions (Signed)
Try plain Mucinex 800 to 1,200 mg twice daily as needed for cough  Stay well hydrated.

## 2022-07-29 NOTE — Progress Notes (Signed)
Established Patient Office Visit  Subjective   Patient ID: Molly Patterson, female    DOB: 1932/02/17  Age: 88 y.o. MRN: [id number]  Chief Complaint  Patient presents with   Follow-up    Stroke from earlier this year. Raking years a couple weeks ago, was wheezing, but has inhaler. Has been 2 weeks and not getting better, may need something else. For 2 weeka after stroke, felt dizzy and worn out, gagging.  Is doing better, but vision is getting blurred, but happened before went to hospital.    HPI   For medical follow-up.  She was seen here back in June and had had recent cerebellar bleed.  Refer to that note for details  02-18-2022 note  Ms. Bendall is seen for hospital and rehab follow-up.  She has history of osteoarthritis, hypertension, Raynaud's, recurrent vertigo.  She been having some progressive nausea and vomiting and vertigo symptoms and had been seen twice at ER in [facility] with negative CT.  Presented to [facility] on 8 May with ongoing symptoms which were progressive.  CT angiogram of the head and neck showed new small acute intraparenchymal hemorrhage in the right cerebellum.  Neurosurgery was consulted without need for intervention.  MRI brain revealed stable acute hemorrhage right cerebellum without any lesions.  Echocardiogram EF 60 to 65% with trivial aortic valve and mitral valve regurg.  She was transferred to rehab on the 15th and stayed there through the 23rd.  She had PT, OT, and speech therapy.  Blood pressure and rehab remain well controlled on combination therapy with benazepril, amlodipine, HCTZ which is her outpatient regimen.  Blood pressure remains well controlled at this time.  Still has occasional nausea and still using ondansetron as needed.  She did not get much relief with the 4 mg dose but did with the 8 mg.  She also has scopolamine patch behind the ear.   They had recommended that she start Crestor during hospitalization but she refuses at this time.   She feels like she is making progress.  She was also discharged on Protonix but denies any reflux symptoms at this time.  She takes tramadol as needed for osteoarthritis pain.  She does have outpatient PT set up and will be evaluated tomorrow.  She feels like her appetite is improved and she has gained some weight since June.  Her weight is up about 6 pounds.  She is happy with her current weight.  She has ongoing severe arthritis pains daily and takes Ultram as needed for that.  She denies any recent recurrent vertigo or any nausea or vomiting.  She was raking leaves some last week and had some cough folic she may have had some wheezing.  No fever.  Has not had flu vaccine yet.  Hypertension treated with amlodipine, HCTZ, and benazepril.  She states she is compliant with medications.  She is still very reluctant to consider statin therapy.  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 She has never used smokeless tobacco. She  reports that she does not drink alcohol and does not use drugs. family history is not on file. Allergies  Allergen Reactions   Penicillins Shortness Of Breath and Swelling    REACTION: hives   Lidocaine-Epinephrine     REACTION: pulse rapic   Rofecoxib     Vioxx, itiching    Review of Systems  Constitutional:  Negative for chills, fever and malaise/fatigue.  Eyes:  Negative for blurred vision.  Respiratory:  Negative for shortness of breath.   Cardiovascular:  Negative for chest pain.  Gastrointestinal:  Negative for nausea and vomiting.  Genitourinary:   Negative for dysuria.  Neurological:  Negative for dizziness, weakness and headaches.      Objective:     BP 132/70 (BP Location: Left Arm, Patient Position: Sitting, Cuff Size: Normal)   Pulse 90   Temp 97.6 F (36.4 C) (Oral)   Wt 125 lb 11.2 oz (57 kg)   SpO2 92%   BMI 22.99 kg/m    Physical Exam Vitals reviewed.  Constitutional:      Appearance: She is well-developed.  Eyes:     Pupils: Pupils are equal, round, and reactive to light.  Neck:     Thyroid: No thyromegaly.     Vascular: No JVD.  Cardiovascular:     Rate and Rhythm: Normal rate and regular rhythm.     Heart sounds:     No gallop.  Pulmonary:     Effort: Pulmonary effort is normal. No respiratory distress.     Breath sounds: Normal breath sounds. No wheezing or rales.  Musculoskeletal:     Cervical back: Neck supple.     Right lower leg: No edema.     Left lower leg: No edema.  Neurological:     General: No focal deficit present.     Mental Status: She is alert.     Cranial Nerves: No cranial nerve deficit.     Motor: No weakness.     Gait: Gait normal.      No results found for any visits on 07/29/22.  Last CBC Lab Results  Component Value Date   WBC 7.9 02/03/2022   HGB 12.6 02/03/2022   HCT 38.7 02/03/2022   MCV 89.2 02/03/2022   MCH 29.0 02/03/2022   RDW 15.4 02/03/2022   PLT 226 60/45/4098   Last metabolic panel Lab Results  Component Value Date   GLUCOSE 96 02/03/2022   NA 139 02/03/2022   K 3.6 02/03/2022   CL 102 02/03/2022   CO2 32 02/03/2022   BUN 16 02/03/2022   CREATININE 0.81 02/03/2022   GFRNONAA >60 02/03/2022   CALCIUM 9.1 02/03/2022   PROT 5.9 (L) 01/28/2022   ALBUMIN 3.0 (L) 01/28/2022   BILITOT 0.8 01/28/2022   ALKPHOS 63 01/28/2022   AST 17 01/28/2022   ALT 21 01/28/2022   ANIONGAP 5 02/03/2022   Last lipids Lab Results  Component Value Date   CHOL 187 01/22/2022   HDL 45 01/22/2022   LDLCALC 125 (H) 01/22/2022   TRIG 83 01/22/2022   CHOLHDL  4.2 01/22/2022   Last thyroid functions Lab Results  Component Value Date   TSH 1.37 01/14/2021      The ASCVD Risk score (Arnett DK, et al., 2019) failed to calculate for the following reasons:   The 2019 ASCVD risk score is only valid for ages 43 to 65    Assessment & Plan:    #1 hypertension stable.  Continue current 3 drug regimen of  amlodipine, benazepril, and HCTZ.  #2 history of cerebellar bleed.  Patient has been recommended statin in the past but declines.  Denies any new neurologic symptoms.  She still has follow-up pending with neurology  #3 osteoarthritis involving multiple joints.  Patient knows to avoid nonsteroidals.  Uses tramadol as needed.  Continue low-dose tramadol for severe pain not relieved with Tylenol  #4 health maintenance-flu vaccine recommended and given.  Return in about 6 months (around 01/27/2023).    Carolann Littler, MD

## 2022-08-12 ENCOUNTER — Other Ambulatory Visit: Payer: Self-pay | Admitting: Family Medicine

## 2022-08-21 DIAGNOSIS — R051 Acute cough: Secondary | ICD-10-CM | POA: Diagnosis not present

## 2022-08-21 DIAGNOSIS — R059 Cough, unspecified: Secondary | ICD-10-CM | POA: Diagnosis not present

## 2022-08-21 DIAGNOSIS — M546 Pain in thoracic spine: Secondary | ICD-10-CM | POA: Diagnosis not present

## 2022-08-21 DIAGNOSIS — R509 Fever, unspecified: Secondary | ICD-10-CM | POA: Diagnosis not present

## 2022-08-22 ENCOUNTER — Telehealth: Payer: Self-pay

## 2022-08-22 NOTE — Telephone Encounter (Signed)
---  Caller states that his wife has a fever and body aches. Caller states that she started to have chills yesterday. Caller states that she has had a cough for a few weeks now. Caller states that temperature is 100.8.  08/21/2022 3:41:54 PM See HCP within 4 Hours (or PCP triage) Eugenio Hoes, RN, Cindy  Comments User: Baird Cancer, RN Date/Time Eilene Ghazi Time): 08/21/2022 3:26:27 PM Patient couldn't hear me during triage, attempted patient again immediately after and get a busy tone  User: Baird Cancer, RN Date/Time Eilene Ghazi Time): 08/21/2022 3:42:42 PM Advised patient that I can call office appointment line to make an appointment but, patient declined and states that she is not near the office and doesn't feel well enough to go.  User: Baird Cancer, RN Date/Time Eilene Ghazi Time): 08/21/2022 3:42:52 PM current temp 101.5  User: Baird Cancer, RN Date/Time (Eastern Time): 08/21/2022 3:43:37 PM Caller states that she will go to a nearby urgent care due to being far from the office.  Referrals GO TO FACILITY OTHER - SPECIFY  08/22/22 1014 - Pt sates later (after talking to triage nurse). States she went UC & xray done, tested flu & RSV (both negative). Was dx with bronchitis. Currently does not have temp. Pt was given abx & steroid tablets to take at home. Pt states she has started taking. Denies further needs, questions/concerns at this time. Has decline OV with PCP. Pt advised to call back if not getting better or develops a fever/SOB. Pt verb understanding.

## 2022-09-17 ENCOUNTER — Ambulatory Visit: Payer: Medicare Other | Admitting: Adult Health

## 2022-09-24 ENCOUNTER — Ambulatory Visit (INDEPENDENT_AMBULATORY_CARE_PROVIDER_SITE_OTHER): Payer: Medicare Other

## 2022-09-24 ENCOUNTER — Ambulatory Visit (INDEPENDENT_AMBULATORY_CARE_PROVIDER_SITE_OTHER): Payer: Medicare Other | Admitting: Family Medicine

## 2022-09-24 ENCOUNTER — Encounter: Payer: Self-pay | Admitting: Family Medicine

## 2022-09-24 VITALS — BP 138/62 | HR 95 | Temp 98.6°F | Ht 62.0 in | Wt 123.7 lb

## 2022-09-24 DIAGNOSIS — R0781 Pleurodynia: Secondary | ICD-10-CM

## 2022-09-24 DIAGNOSIS — R062 Wheezing: Secondary | ICD-10-CM | POA: Diagnosis not present

## 2022-09-24 DIAGNOSIS — G2581 Restless legs syndrome: Secondary | ICD-10-CM | POA: Diagnosis not present

## 2022-09-24 DIAGNOSIS — M25572 Pain in left ankle and joints of left foot: Secondary | ICD-10-CM | POA: Diagnosis not present

## 2022-09-24 MED ORDER — PREDNISONE 20 MG PO TABS: ORAL_TABLET | ORAL | 0 refills | Status: DC

## 2022-09-24 NOTE — Progress Notes (Signed)
Established Patient Office Visit  Subjective   Patient ID: Molly Patterson, female    DOB: 10-Mar-1932  Age: 89 y.o. MRN: [id number]  Chief Complaint  Patient presents with   Fall    Patient complains of fall, x2 week   Rib pain    Patient complains of left sided rib pain, x2 weeks    Ankle Pain    Patient complains of left ankle pain,     HPI   Shaima is seen for injuries sustained with fall at home on 09-09-2022.  She was carrying a tray into another room and apparently was going down some steps and missed next to the last step.  She fell and struck her head against the wall but no loss of consciousness.  She had some bruising left arm and left anterior rib pain and left ankle pain.  She has been forcing herself to take some deep breaths.  No headache.  No confusion.  Rib pain is moderate.  She has been taking tramadol for that.  Able to bear weight on left ankle.  Most of her pain is medial and below the medial malleolus  She does have some wheezing but no fever.  No dyspnea.   She is also describing several week/month hx of restlessness of legs at night.   Sensation of wanting to move legs .    Worse at night   no late day use of caffeine.  No ETOH.   No anemia by most recent lab.    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 She has never used smokeless tobacco. She reports that she does not drink alcohol and does not use drugs. family history is not on file. Allergies  Allergen Reactions   Penicillins Shortness Of Breath and Swelling    REACTION: hives    Lidocaine-Epinephrine     REACTION: pulse rapic   Rofecoxib     Vioxx, itiching    Review of Systems  Constitutional:  Negative for chills and fever.  Eyes:  Negative for blurred vision.  Cardiovascular:  Negative for chest pain.  Genitourinary:  Negative for dysuria.  Neurological:  Negative for dizziness, focal weakness and headaches.      Objective:     BP 138/62 (BP Location: Right Arm, Patient Position: Sitting, Cuff Size: Normal)   Pulse 95   Temp 98.6 F (37 C) (Oral)   Ht '5\' 2"'$  (1.575 m)   Wt 123 lb 11.2 oz (56.1 kg)   SpO2 94%   BMI 22.63 kg/m  BP Readings from Last 3 Encounters:  09/24/22 138/62  07/29/22 132/70  04/04/22 136/77   Wt Readings from Last 3 Encounters:  09/24/22 123 lb 11.2 oz (56.1 kg)  07/29/22 125 lb 11.2 oz (57 kg)  04/04/22 121 lb 3.2 oz (55 kg)      Physical Exam Vitals reviewed.  Cardiovascular:     Rate and Rhythm: Normal rate.  Pulmonary:     Comments: He has some scattered diffuse wheezes.  No rales.  Tender to palpation left anterior rib cage area around the 5th-6th rib Musculoskeletal:     Comments: Left ankle reveals no distal fibula or distal tibia tenderness.  She does have significant tenderness inferior to the medial malleolus.  No Achilles tenderness.  Achilles intact.  Neurological:     Mental Status: She is alert.      No results found for any visits on 09/24/22.    The ASCVD Risk score (Arnett DK, et al., 2019) failed to calculate for the following reasons:   The 2019 ASCVD risk score is only valid for ages 63 to 7    Assessment & Plan:   #1 Left ankle pain following recent injury.  Obtain x-rays to further assess.  X-rays show no obvious acute bony abnormality.   To be over-read.   4 inch ACE wrap applied.  #2 left anterior rib pain.   She is aware that she might have fracture.   Discussed X-ray but would not likely change management.  Continue to be conscience to take several deep breaths per day.     #3 mild cough with mild reactive airway disease.   Low dose prednisone for 6 days.  Follow up for any persistent cough or wheeze  #4  ?restless leg syndrome.   Discussed possible trial of low dose Mirapex at night.   She declines at this time but will be in touch if symptoms persist.     No follow-ups on file.    Carolann Littler, MD

## 2022-09-29 ENCOUNTER — Other Ambulatory Visit: Payer: Self-pay | Admitting: Family Medicine

## 2022-10-15 ENCOUNTER — Other Ambulatory Visit: Payer: Self-pay | Admitting: Family Medicine

## 2022-10-16 ENCOUNTER — Telehealth: Payer: Self-pay | Admitting: Family Medicine

## 2022-10-16 MED ORDER — AMLODIPINE BESYLATE 5 MG PO TABS: 5.0000 mg | ORAL_TABLET | Freq: Every day | ORAL | 0 refills | Status: DC

## 2022-10-16 NOTE — Telephone Encounter (Signed)
Rx sent 

## 2022-10-16 NOTE — Telephone Encounter (Signed)
Pt is calling and would like to know the reason amlodipine besylate was denied. Pt is aware md out of the office today

## 2022-10-16 NOTE — Telephone Encounter (Signed)
Pt called, returning CMA's call. CMA was unavailable. Pt asked that CMA call back at his earliest convenience.

## 2022-10-24 ENCOUNTER — Other Ambulatory Visit: Payer: Self-pay | Admitting: Family Medicine

## 2022-11-10 ENCOUNTER — Telehealth: Payer: Self-pay | Admitting: Family Medicine

## 2022-11-10 NOTE — Telephone Encounter (Signed)
Patient calling in regarding her legs jerking, says it was discussed at the last appointment, says provider will call in a prescription for her [place] [place] [facility] [place] Phone: [phone]  Fax: [phone]

## 2022-11-12 MED ORDER — PRAMIPEXOLE DIHYDROCHLORIDE 0.125 MG PO TABS: ORAL_TABLET | ORAL | 5 refills | Status: DC

## 2022-11-12 NOTE — Telephone Encounter (Signed)
Rx sent 

## 2022-11-12 NOTE — Addendum Note (Signed)
Addended by: Nilda Riggs on: 11/12/2022 08:07 AM   Modules accepted: Orders

## 2022-12-26 ENCOUNTER — Other Ambulatory Visit: Payer: Self-pay | Admitting: Family Medicine

## 2023-01-12 DIAGNOSIS — H2513 Age-related nuclear cataract, bilateral: Secondary | ICD-10-CM | POA: Diagnosis not present

## 2023-01-12 DIAGNOSIS — H401432 Capsular glaucoma with pseudoexfoliation of lens, bilateral, moderate stage: Secondary | ICD-10-CM | POA: Diagnosis not present

## 2023-01-17 DIAGNOSIS — S0502XA Injury of conjunctiva and corneal abrasion without foreign body, left eye, initial encounter: Secondary | ICD-10-CM | POA: Diagnosis not present

## 2023-02-11 DIAGNOSIS — H401433 Capsular glaucoma with pseudoexfoliation of lens, bilateral, severe stage: Secondary | ICD-10-CM | POA: Diagnosis not present

## 2023-02-19 ENCOUNTER — Other Ambulatory Visit: Payer: Self-pay | Admitting: Family Medicine

## 2023-03-18 ENCOUNTER — Ambulatory Visit: Payer: Medicare Other | Admitting: Family Medicine

## 2023-03-27 ENCOUNTER — Ambulatory Visit (INDEPENDENT_AMBULATORY_CARE_PROVIDER_SITE_OTHER): Payer: Medicare Other | Admitting: Family Medicine

## 2023-03-27 VITALS — BP 140/60 | HR 91 | Temp 97.9°F | Ht 62.0 in | Wt 122.1 lb

## 2023-03-27 DIAGNOSIS — I10 Essential (primary) hypertension: Secondary | ICD-10-CM

## 2023-03-27 DIAGNOSIS — M25552 Pain in left hip: Secondary | ICD-10-CM

## 2023-03-27 DIAGNOSIS — M25559 Pain in unspecified hip: Secondary | ICD-10-CM

## 2023-03-27 LAB — BASIC METABOLIC PANEL
BUN: 14 mg/dL (ref 6–23)
CO2: 28 mEq/L (ref 19–32)
Calcium: 10.2 mg/dL (ref 8.4–10.5)
Chloride: 100 mEq/L (ref 96–112)
Creatinine, Ser: 0.71 mg/dL (ref 0.40–1.20)
GFR: 75.4 mL/min (ref >60.00)
Glucose, Bld: 99 mg/dL (ref 70–99)
Potassium: 4.3 mEq/L (ref 3.5–5.1)
Sodium: 137 mEq/L (ref 135–145)

## 2023-03-27 MED ORDER — METHYLPREDNISOLONE ACETATE 40 MG/ML IJ SUSP
40.0000 mg | Freq: Once | INTRAMUSCULAR | Status: AC
  Administered 2023-03-27: 40 mg via INTRAMUSCULAR

## 2023-03-27 NOTE — Progress Notes (Signed)
Established Patient Office Visit  Subjective   Patient ID: Molly Patterson, female    DOB: [birth year]  Age: 89 y.o. MRN: [id number]  Chief Complaint  Patient presents with   Hip Pain         HPI   Molly Patterson is seen with some recent progressive left lateral hip pain.  Molly Patterson had history of greater trochanteric bursitis in the past.  Denies any injury.  Molly Patterson has had injections in the past which have helped.  Typically has increased night pain.  No recent fall or injury.  Molly Patterson has longstanding history of hypertension.  Currently treated with HCTZ 12.5 mg daily along with benazepril 10 mg daily and amlodipine 5 mg.  Molly Patterson stays quite active.  Enjoys gardening.  No recent dizziness.  Has not had basic metabolic panel in over a year.  Past Medical History:  Diagnosis Date   Allergy    hayfever   ANEMIA-IRON DEFICIENCY 08/02/2009   Basal cell carcinoma (BCC) of right forearm    BURSITIS, HIP 08/02/2009   COVID-19    HYPERTENSION 08/02/2009   OSTEOARTHRITIS, GENERALIZED 08/12/2010   Raynaud's syndrome 08/12/2010   Transfusion history 09/15/1962   Vertigo    Past Surgical History:  Procedure Laterality Date   APPENDECTOMY     TONSILLECTOMY      reports that Molly Patterson has never smoked. Molly Patterson has never used smokeless tobacco. Molly Patterson reports that Molly Patterson does not drink alcohol and does not use drugs. family history is not on file. Allergies  Allergen Reactions   Penicillins Shortness Of Breath and Swelling    REACTION: hives   Lidocaine-Epinephrine     REACTION: pulse rapic   Rofecoxib     Vioxx, itiching    Review of Systems  Constitutional:  Negative for malaise/fatigue.  Eyes:  Negative for blurred vision.  Respiratory:  Negative for shortness of breath.   Cardiovascular:  Negative for chest pain.  Neurological:  Negative for dizziness, weakness and headaches.      Objective:     BP (!) 140/60 (BP Location: Left Arm, Cuff Size: Normal)   Pulse 91   Temp 97.9 F (36.6 C) (Oral)    Ht 5\' 2"  (1.575 m)   Wt 122 lb 1.6 oz (55.4 kg)   SpO2 97%   BMI 22.33 kg/m  BP Readings from Last 3 Encounters:  03/27/23 (!) 140/60  09/24/22 138/62  07/29/22 132/70   Wt Readings from Last 3 Encounters:  03/27/23 122 lb 1.6 oz (55.4 kg)  09/24/22 123 lb 11.2 oz (56.1 kg)  07/29/22 125 lb 11.2 oz (57 kg)      Physical Exam Vitals reviewed.  Constitutional:      Appearance: Normal appearance.  Cardiovascular:     Rate and Rhythm: Normal rate and regular rhythm.  Pulmonary:     Effort: Pulmonary effort is normal.     Breath sounds: Normal breath sounds.  Musculoskeletal:     Right lower leg: No edema.     Left lower leg: No edema.     Comments: Significant tenderness left lateral hip over the greater trochanteric bursa.  Excellent range of motion left hip and right hip.  Neurological:     Mental Status: Molly Patterson is alert.      No results found for any visits on 03/27/23.  Last CBC Lab Results  Component Value Date   WBC 7.9 02/03/2022   HGB 12.6 02/03/2022   HCT 38.7 02/03/2022   MCV 89.2 02/03/2022   MCH  29.0 02/03/2022   RDW 15.4 02/03/2022   PLT 226 02/03/2022   Last metabolic panel Lab Results  Component Value Date   GLUCOSE 96 02/03/2022   NA 139 02/03/2022   K 3.6 02/03/2022   CL 102 02/03/2022   CO2 32 02/03/2022   BUN 16 02/03/2022   CREATININE 0.81 02/03/2022   GFRNONAA >60 02/03/2022   CALCIUM 9.1 02/03/2022   PROT 5.9 (L) 01/28/2022   ALBUMIN 3.0 (L) 01/28/2022   BILITOT 0.8 01/28/2022   ALKPHOS 63 01/28/2022   AST 17 01/28/2022   ALT 21 01/28/2022   ANIONGAP 5 02/03/2022   Last lipids Lab Results  Component Value Date   CHOL 187 01/22/2022   HDL 45 01/22/2022   LDLCALC 125 (H) 01/22/2022   TRIG 83 01/22/2022   CHOLHDL 4.2 01/22/2022   Last hemoglobin A1c Lab Results  Component Value Date   HGBA1C 5.9 (H) 01/22/2022   Last thyroid functions Lab Results  Component Value Date   TSH 1.37 01/14/2021      The ASCVD Risk score  (Arnett DK, et al., 2019) failed to calculate for the following reasons:   The 2019 ASCVD risk score is only valid for ages 13 to 66    Assessment & Plan:   #1 greater trochanteric bursitis left lateral hip.  Patient has benefited years ago from hip injection.  Molly Patterson is requesting the same today.  We discussed risk of bleeding, bruising, low risk of infection and patient consented.  Prepped left lateral hip region with Betadine.  Using 25-gauge 1 inch needle injected 40 mg of Depo-Medrol and 2 cc of plain Xylocaine and patient tolerated well.  Molly Patterson will try some icing at night.  Be in touch if not improving over the next week  #2 hypertension.  Initially up slightly today but improved after rest.  Continue low-sodium diet.  Continue amlodipine, benazepril, and HCTZ.  Check basic metabolic panel today.   Return in about 6 months (around 09/27/2023).    Evelena Peat, MD

## 2023-04-10 ENCOUNTER — Other Ambulatory Visit: Payer: Self-pay | Admitting: Family Medicine

## 2023-05-21 DIAGNOSIS — K080 Exfoliation of teeth due to systemic causes: Secondary | ICD-10-CM | POA: Diagnosis not present

## 2023-05-26 DIAGNOSIS — K080 Exfoliation of teeth due to systemic causes: Secondary | ICD-10-CM | POA: Diagnosis not present

## 2023-06-15 DIAGNOSIS — H401433 Capsular glaucoma with pseudoexfoliation of lens, bilateral, severe stage: Secondary | ICD-10-CM | POA: Diagnosis not present

## 2023-08-28 ENCOUNTER — Other Ambulatory Visit: Payer: Self-pay | Admitting: Family Medicine

## 2023-09-24 ENCOUNTER — Other Ambulatory Visit: Payer: Self-pay | Admitting: Family Medicine

## 2023-10-02 ENCOUNTER — Other Ambulatory Visit: Payer: Self-pay | Admitting: Family Medicine

## 2023-10-09 ENCOUNTER — Other Ambulatory Visit: Payer: Self-pay | Admitting: Family Medicine

## 2023-10-12 DIAGNOSIS — R059 Cough, unspecified: Secondary | ICD-10-CM | POA: Diagnosis not present

## 2023-10-12 DIAGNOSIS — I700 Atherosclerosis of aorta: Secondary | ICD-10-CM | POA: Diagnosis not present

## 2023-10-12 DIAGNOSIS — Z20822 Contact with and (suspected) exposure to covid-19: Secondary | ICD-10-CM | POA: Diagnosis not present

## 2023-10-12 DIAGNOSIS — R070 Pain in throat: Secondary | ICD-10-CM | POA: Diagnosis not present

## 2023-10-12 DIAGNOSIS — J209 Acute bronchitis, unspecified: Secondary | ICD-10-CM | POA: Diagnosis not present

## 2023-11-06 ENCOUNTER — Ambulatory Visit: Payer: Medicare Other | Admitting: Family Medicine

## 2023-11-06 ENCOUNTER — Encounter: Payer: Self-pay | Admitting: Family Medicine

## 2023-11-06 VITALS — BP 132/80 | HR 102 | Temp 98.4°F | Wt 121.4 lb

## 2023-11-06 DIAGNOSIS — M150 Primary generalized (osteo)arthritis: Secondary | ICD-10-CM | POA: Diagnosis not present

## 2023-11-06 DIAGNOSIS — R059 Cough, unspecified: Secondary | ICD-10-CM | POA: Diagnosis not present

## 2023-11-06 DIAGNOSIS — F4321 Adjustment disorder with depressed mood: Secondary | ICD-10-CM | POA: Diagnosis not present

## 2023-11-06 DIAGNOSIS — I10 Essential (primary) hypertension: Secondary | ICD-10-CM | POA: Diagnosis not present

## 2023-11-06 NOTE — Patient Instructions (Signed)
 Follow up for any fever or increased shortness of breath.

## 2023-11-06 NOTE — Progress Notes (Signed)
Established Patient Office Visit  Subjective   Patient ID: Molly Patterson, female    DOB: October 29, 1931  Age: 89 y.o. MRN: [id number]  Chief Complaint  Patient presents with   Hospitalization Follow-up    HPI   Molly Patterson is here for medical follow-up.  She unfortunately just lost her husband back in January from pneumonia and ARDS complications.  He was admitted at [facility] .   They had been married 68 years.  He was 89 years old.  He had history of A-fib but otherwise fairly healthy.  Patient developed cough late January at the time of his hospitalization.  She went to the local urgent care in [place] and chest x-ray showed no consolidation.  She was treated with 10-day course of antibiotics for "bronchitis ".  She had influenza, RSV, and COVID testing which were all negative.  Overall feels some improved.  No fever.  Still some lingering cough which she describes as "loose ".  No significant dyspnea.  No hemoptysis.  Appetite and weight stable.  She has had some right upper back pain around the trapezius region and that she thinks this is from coughing.  She has hypertension treated with benazepril and amlodipine.  She had been on HCTZ but took herself off about a month ago and her blood pressures by home readings been stable since then with 120s to 130s systolic  She does have arthritis and takes tramadol usually 2 to 3/day and has been on this for several years.  No history of misuse.  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 She has never used smokeless  tobacco. She reports that she does not drink alcohol and does not use drugs. family history is not on file. Allergies  Allergen Reactions   Penicillins Shortness Of Breath and Swelling    REACTION: hives   Lidocaine-Epinephrine     REACTION: pulse rapic   Rofecoxib     Vioxx, itiching    Review of Systems  Constitutional:  Negative for chills, fever and malaise/fatigue.  Eyes:  Negative for blurred vision.  Respiratory:  Positive for cough. Negative for hemoptysis and shortness of breath.   Cardiovascular:  Negative for chest pain.  Neurological:  Negative for dizziness, weakness and headaches.      Objective:     BP 132/80 (BP Location: Left Arm, Cuff Size: Normal)   Pulse (!) 102   Temp 98.4 F (36.9 C) (Oral)   Wt 121 lb 6.4 oz (55.1 kg)   SpO2 96%   BMI 22.20 kg/m  BP Readings from Last 3 Encounters:  11/06/23 132/80  03/27/23 (!) 140/60  09/24/22 138/62   Wt Readings from Last 3 Encounters:  11/06/23 121 lb 6.4 oz (55.1 kg)  03/27/23 122 lb 1.6 oz (55.4 kg)  09/24/22 123 lb 11.2 oz (56.1 kg)      Physical Exam Vitals reviewed.  Constitutional:      General: She is not in acute distress.    Appearance: She is not ill-appearing.  Cardiovascular:  Rate and Rhythm: Normal rate and regular rhythm.  Pulmonary:     Effort: Pulmonary effort is normal.     Breath sounds: Normal breath sounds. No rales.  Musculoskeletal:     Cervical back: Neck supple.     Right lower leg: No edema.     Left lower leg: No edema.  Lymphadenopathy:     Cervical: No cervical adenopathy.  Neurological:     Mental Status: She is alert.      No results found for any visits on 11/06/23.  Last metabolic panel Lab Results  Component Value Date   GLUCOSE 99 03/27/2023   NA 137 03/27/2023   K 4.3 03/27/2023   CL 100 03/27/2023   CO2 28 03/27/2023   BUN 14 03/27/2023   CREATININE 0.71 03/27/2023   GFR 75.40 03/27/2023   CALCIUM 10.2 03/27/2023   PROT 5.9 (L)  01/28/2022   ALBUMIN 3.0 (L) 01/28/2022   BILITOT 0.8 01/28/2022   ALKPHOS 63 01/28/2022   AST 17 01/28/2022   ALT 21 01/28/2022   ANIONGAP 5 02/03/2022      The ASCVD Risk score (Arnett DK, et al., 2019) failed to calculate for the following reasons:   The 2019 ASCVD risk score is only valid for ages 82 to 68    Assessment & Plan:   #1 recent cough.  Patient went to urgent care and chest x-ray unremarkable.  She was treated with antibiotics for presumed bronchitis.  Overall improved.  No fever.  Lung exam is unremarkable.  No rales.  O2 sat 96% room air.  Follow-up promptly for any fever or increased shortness of breath.  #2 hypertension.  History of presumed whitecoat syndrome.  Initial blood pressure up today but improved tremendously with rest.  Continue benazepril and amlodipine.  Continue to hold HCTZ currently with home blood pressure stable off this medication  #3 history of chronic osteoarthritis.  Patient uses tramadol low dosage intermittently which seem to be working well.  #4 grieving from recent loss of husband of 30 years marriage.  She has very supportive family and overall coping fairly well.  Molly Peat, MD

## 2023-11-11 DIAGNOSIS — H401433 Capsular glaucoma with pseudoexfoliation of lens, bilateral, severe stage: Secondary | ICD-10-CM | POA: Diagnosis not present

## 2023-11-26 ENCOUNTER — Other Ambulatory Visit: Payer: Self-pay | Admitting: Family Medicine

## 2023-12-14 ENCOUNTER — Ambulatory Visit: Payer: Self-pay | Admitting: *Deleted

## 2023-12-14 MED ORDER — ALBUTEROL SULFATE HFA 108 (90 BASE) MCG/ACT IN AERS: INHALATION_SPRAY | RESPIRATORY_TRACT | 0 refills | Status: AC

## 2023-12-14 MED ORDER — ALBUTEROL SULFATE HFA 108 (90 BASE) MCG/ACT IN AERS: INHALATION_SPRAY | RESPIRATORY_TRACT | 0 refills | Status: DC

## 2023-12-14 NOTE — Telephone Encounter (Signed)
  Chief Complaint: occasional wheezing- no SOB- patient requesting inhaler Symptoms: some wheezing- congestion related- worse with pollen- patient requesting inhaler Frequency: OV 11/06/23- patient reports she does not have difficultly breathing- some tightness with breathing at times- requesting inhaler Pertinent Negatives: Patient denies SOB, fever Disposition: [] ED /[] Urgent Care (no appt availability in office) / [] Appointment(In office/virtual)/ []  [facility]/ [] Home Care/ [x] Refused Recommended Disposition /[] [facility]/ []  Follow-up with PCP Additional Notes: Patient states she needs refill of an inhaler for her wheezing. Patient state sshe does not have SOB- occasional chest tightness related to wheezing. Patient states she has hx of bronchitis without asthma diagnosis. Patient states her provider is aware and has prescribed inhaler before. Patient is requesting inhaler Rx- she states she does not have transportation to office if appointment is needed. Patient advised I would send her request.    Copied from CRM [id number]. Topic: Clinical - Red Word Triage >> Dec 14, 2023  9:59 AM Florestine Avers wrote: Red Word that prompted transfer to Nurse Triage: Patient called in stating that she was still wheezing and coughing. She states that Dr. Caryl Never informed her that if she is still having these symptoms to contact him back and he will fill a inhaler for her. Patient is currently wheezing and coughing, no fever. Reason for Disposition  [1] MILD longstanding difficulty breathing AND [2]  SAME as normal  Answer Assessment - Initial Assessment Questions 1. RESPIRATORY STATUS: "Describe your breathing?" (e.g., wheezing, shortness of breath, unable to speak, severe coughing)      Hx bronchitis- not having breathing problem- just audible wheezing 2. ONSET: "When did this breathing problem begin?"      Patient has wheezing with congestion- increased since visit in office 3. PATTERN  "Does the difficult breathing come and go, or has it been constant since it started?"      Comes and goes 4. SEVERITY: "How bad is your breathing?" (e.g., mild, moderate, severe)    - MILD: No SOB at rest, mild SOB with walking, speaks normally in sentences, can lie down, no retractions, pulse < 100.    - MODERATE: SOB at rest, SOB with minimal exertion and prefers to sit, cannot lie down flat, speaks in phrases, mild retractions, audible wheezing, pulse 100-120.    - SEVERE: Very SOB at rest, speaks in single words, struggling to breathe, sitting hunched forward, retractions, pulse > 120      Some tightness- no SOB 5. RECURRENT SYMPTOM: "Have you had difficulty breathing before?" If Yes, ask: "When was the last time?" and "What happened that time?"      No - hx bronchitis  8. CAUSE: "What do you think is causing the breathing problem?"      pollen 9. OTHER SYMPTOMS: "Do you have any other symptoms? (e.g., dizziness, runny nose, cough, chest pain, fever)     No other symptoms- related to pollen  Protocols used: Breathing Difficulty-A-AH

## 2023-12-14 NOTE — Addendum Note (Signed)
 Addended by: Christy Sartorius on: 12/14/2023 01:34 PM   Modules accepted: Orders

## 2023-12-14 NOTE — Telephone Encounter (Signed)
 Rx sent

## 2023-12-23 ENCOUNTER — Other Ambulatory Visit: Payer: Self-pay | Admitting: Family Medicine

## 2024-01-21 DIAGNOSIS — H401432 Capsular glaucoma with pseudoexfoliation of lens, bilateral, moderate stage: Secondary | ICD-10-CM | POA: Diagnosis not present

## 2024-01-21 DIAGNOSIS — H2513 Age-related nuclear cataract, bilateral: Secondary | ICD-10-CM | POA: Diagnosis not present

## 2024-03-08 ENCOUNTER — Other Ambulatory Visit: Payer: Self-pay | Admitting: Family Medicine

## 2024-03-15 ENCOUNTER — Other Ambulatory Visit: Payer: Self-pay | Admitting: Family Medicine

## 2024-04-18 ENCOUNTER — Encounter: Payer: Self-pay | Admitting: Family Medicine

## 2024-04-18 ENCOUNTER — Ambulatory Visit (INDEPENDENT_AMBULATORY_CARE_PROVIDER_SITE_OTHER): Admitting: Family Medicine

## 2024-04-18 VITALS — BP 148/68 | HR 109 | Temp 98.2°F | Wt 119.9 lb

## 2024-04-18 DIAGNOSIS — M150 Primary generalized (osteo)arthritis: Secondary | ICD-10-CM | POA: Diagnosis not present

## 2024-04-18 DIAGNOSIS — G2581 Restless legs syndrome: Secondary | ICD-10-CM | POA: Diagnosis not present

## 2024-04-18 DIAGNOSIS — I10 Essential (primary) hypertension: Secondary | ICD-10-CM

## 2024-04-18 MED ORDER — HYDROCHLOROTHIAZIDE 12.5 MG PO CAPS: 12.5000 mg | ORAL_CAPSULE | Freq: Every day | ORAL | 3 refills | Status: AC

## 2024-04-18 MED ORDER — AMLODIPINE BESYLATE 5 MG PO TABS: 5.0000 mg | ORAL_TABLET | Freq: Every day | ORAL | 3 refills | Status: AC

## 2024-04-18 MED ORDER — BENAZEPRIL HCL 10 MG PO TABS: 10.0000 mg | ORAL_TABLET | Freq: Every day | ORAL | 3 refills | Status: AC

## 2024-04-18 NOTE — Progress Notes (Signed)
 Established Patient Office Visit  Subjective   Patient ID: Molly Patterson, female    DOB: [birth year]  Age: 89 y.o. MRN: [id number]  Chief Complaint  Patient presents with   Medical Management of Chronic Issues    HPI   Ms. Bodner is seen today for routine medical follow-up.  She has history of hypertension, osteoarthritis involving multiple joints, history of intracerebral hemorrhage.  Generally feeling well.  Her husband passed away 6 months ago and she has naturally had some difficulties coping with that but has been staying very active.  She has a garden again this year and has been involved with that.  She suspects she has whitecoat syndrome.  Blood pressures consistently well-controlled at home.  Needs refills of amlodipine , benazepril , and HCTZ.  Not consistently taking HCTZ.  She has had some visual difficulties since her cerebral bleed and also has cataracts followed by ophthalmology.  Denies any recent falls.  She has restless leg symptoms and takes Mirapex  which is controlling that well  For osteoarthritis she takes tramadol  usually twice per day.  She states Tylenol  does not help.  She tries to avoid ibuprofen because of her age and history of bleed.  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 She has never used smokeless tobacco. She reports that she does not drink alcohol and does not use drugs. family history is not on file. Allergies  Allergen Reactions   Penicillins Shortness Of Breath and Swelling    REACTION: hives   Lidocaine-Epinephrine      REACTION: pulse rapic   Rofecoxib     Vioxx, itiching    Review of Systems  Constitutional:  Negative for malaise/fatigue.  Respiratory:  Negative for shortness of breath.   Cardiovascular:  Negative for chest pain.  Neurological:  Negative for dizziness, weakness and headaches.      Objective:     BP (!) 156/64   Pulse (!) 109   Temp 98.2 F (36.8 C) (Oral)   Wt 119 lb 14.4 oz (54.4 kg)   SpO2 95%   BMI 21.93 kg/m  BP Readings from Last 3 Encounters:  04/18/24 (!) 148/68  11/06/23 132/80  03/27/23 (!) 140/60   Wt Readings from Last 3 Encounters:  04/18/24 119 lb 14.4 oz (54.4 kg)  11/06/23 121 lb 6.4 oz (55.1 kg)  03/27/23 122 lb 1.6 oz (55.4 kg)      Physical Exam Vitals reviewed.  Constitutional:      General: She is not in acute distress.    Appearance: She is well-developed. She is not ill-appearing.  Eyes:     Pupils: Pupils are equal, round, and reactive to light.  Neck:     Thyroid : No thyromegaly.     Vascular: No JVD.  Cardiovascular:     Rate and Rhythm: Normal rate and regular rhythm.     Heart sounds:     No gallop.  Pulmonary:     Effort: Pulmonary effort is normal. No respiratory distress.     Breath sounds: Normal  breath sounds. No wheezing or rales.  Musculoskeletal:     Cervical back: Neck supple.     Right lower leg: No edema.     Left lower leg: No edema.  Neurological:     Mental Status: She is alert.      No results found for any visits on 04/18/24.  Last CBC Lab Results  Component Value Date   WBC 7.9 02/03/2022   HGB 12.6 02/03/2022   HCT 38.7 02/03/2022   MCV 89.2 02/03/2022   MCH 29.0 02/03/2022   RDW 15.4 02/03/2022   PLT 226 02/03/2022   Last metabolic panel Lab Results  Component Value Date   GLUCOSE 99 03/27/2023   NA 137 03/27/2023   K 4.3 03/27/2023   CL 100 03/27/2023   CO2 28 03/27/2023   BUN 14 03/27/2023   CREATININE 0.71 03/27/2023   GFR 75.40 03/27/2023   CALCIUM  10.2 03/27/2023   PROT 5.9  (L) 01/28/2022   ALBUMIN 3.0 (L) 01/28/2022   BILITOT 0.8 01/28/2022   ALKPHOS 63 01/28/2022   AST 17 01/28/2022   ALT 21 01/28/2022   ANIONGAP 5 02/03/2022   Last hemoglobin A1c Lab Results  Component Value Date   HGBA1C 5.9 (H) 01/22/2022      The ASCVD Risk score (Arnett DK, et al., 2019) failed to calculate for the following reasons:   The 2019 ASCVD risk score is only valid for ages 76 to 58    Assessment & Plan:   #1 hypertension.  Up some today but she suspects whitecoat syndrome.  She states her blood pressures consistently well-controlled at home.  Continue amlodipine , benazepril , and HCTZ.  Continue low-sodium diet.  Did recommend basic metabolic panel but she declines but agrees to getting this at 35-month follow-up  #2 osteoarthritis involving multiple joints.  Generally controlled with tramadol  1 twice daily.  Continue with this regimen.  Recommend supplementation with Tylenol  but she has not gotten much benefit from that.  She knows to avoid non-steroidals  #3 history of restless legs.  Controlled with Mirapex .  Continue current dose of Mirapex  which is 0.125 mg nightly   Wolm Scarlet, MD

## 2024-05-12 DIAGNOSIS — H401433 Capsular glaucoma with pseudoexfoliation of lens, bilateral, severe stage: Secondary | ICD-10-CM | POA: Diagnosis not present

## 2024-06-16 ENCOUNTER — Other Ambulatory Visit: Payer: Self-pay | Admitting: Family Medicine

## 2024-07-13 DIAGNOSIS — Z9181 History of falling: Secondary | ICD-10-CM | POA: Diagnosis not present

## 2024-07-13 DIAGNOSIS — M25532 Pain in left wrist: Secondary | ICD-10-CM | POA: Diagnosis not present

## 2024-07-13 DIAGNOSIS — M7989 Other specified soft tissue disorders: Secondary | ICD-10-CM | POA: Diagnosis not present

## 2024-07-13 DIAGNOSIS — M8580 Other specified disorders of bone density and structure, unspecified site: Secondary | ICD-10-CM | POA: Diagnosis not present

## 2024-07-21 DIAGNOSIS — S62102A Fracture of unspecified carpal bone, left wrist, initial encounter for closed fracture: Secondary | ICD-10-CM | POA: Diagnosis not present

## 2024-07-21 DIAGNOSIS — M25532 Pain in left wrist: Secondary | ICD-10-CM | POA: Diagnosis not present

## 2024-07-21 DIAGNOSIS — W19XXXA Unspecified fall, initial encounter: Secondary | ICD-10-CM | POA: Diagnosis not present

## 2024-09-13 ENCOUNTER — Other Ambulatory Visit: Payer: Self-pay | Admitting: Family Medicine

## 2024-09-16 ENCOUNTER — Telehealth: Payer: Self-pay | Admitting: Family Medicine

## 2024-09-16 NOTE — Telephone Encounter (Signed)
 Copied from CRM [id number]. Topic: General - Other >> Sep 16, 2024  3:08 PM Jayma L wrote: Reason for CRM: patients son is in need of a new pcp , patient asking we consider her son, I told her at this time the pcp isnt taking new patient but advised id send a message to office    Sons name is Daianna Vasques

## 2024-09-19 NOTE — Telephone Encounter (Signed)
 No answer  after several rings enter the remote access

## 2024-09-20 NOTE — Telephone Encounter (Signed)
 Per pt mom brian caseworker found him PCP
